# Patient Record
Sex: Male | Born: 1960 | Race: Black or African American | Hispanic: No | Marital: Single | Smoking: Current every day smoker
Health system: Southern US, Community
[De-identification: ages and names within clinical notes are randomized; demographics above are authoritative.]

## PROBLEM LIST (undated history)

## (undated) DIAGNOSIS — W3400XA Accidental discharge from unspecified firearms or gun, initial encounter: Secondary | ICD-10-CM

## (undated) DIAGNOSIS — K625 Hemorrhage of anus and rectum: Secondary | ICD-10-CM

## (undated) DIAGNOSIS — I509 Heart failure, unspecified: Secondary | ICD-10-CM

## (undated) DIAGNOSIS — R079 Chest pain, unspecified: Secondary | ICD-10-CM

## (undated) DIAGNOSIS — I10 Essential (primary) hypertension: Secondary | ICD-10-CM

## (undated) DIAGNOSIS — Y249XXA Unspecified firearm discharge, undetermined intent, initial encounter: Secondary | ICD-10-CM

## (undated) DIAGNOSIS — I255 Ischemic cardiomyopathy: Secondary | ICD-10-CM

## (undated) DIAGNOSIS — K921 Melena: Secondary | ICD-10-CM

## (undated) DIAGNOSIS — F419 Anxiety disorder, unspecified: Secondary | ICD-10-CM

## (undated) DIAGNOSIS — E785 Hyperlipidemia, unspecified: Secondary | ICD-10-CM

## (undated) DIAGNOSIS — I2510 Atherosclerotic heart disease of native coronary artery without angina pectoris: Secondary | ICD-10-CM

## (undated) DIAGNOSIS — M1990 Unspecified osteoarthritis, unspecified site: Secondary | ICD-10-CM

## (undated) DIAGNOSIS — R42 Dizziness and giddiness: Secondary | ICD-10-CM

## (undated) DIAGNOSIS — I219 Acute myocardial infarction, unspecified: Secondary | ICD-10-CM

## (undated) HISTORY — DX: Ischemic cardiomyopathy: I25.5

## (undated) HISTORY — PX: OTHER SURGICAL HISTORY: SHX169

## (undated) HISTORY — PX: ABDOMINAL EXPLORATION SURGERY: SHX538

## (undated) HISTORY — PX: CORONARY ANGIOPLASTY WITH STENT PLACEMENT: SHX49

## (undated) HISTORY — DX: Atherosclerotic heart disease of native coronary artery without angina pectoris: I25.10

## (undated) HISTORY — DX: Hemorrhage of anus and rectum: K62.5

## (undated) HISTORY — DX: Dizziness and giddiness: R42

## (undated) HISTORY — DX: Hyperlipidemia, unspecified: E78.5

## (undated) HISTORY — DX: Chest pain, unspecified: R07.9

---

## 1999-10-18 ENCOUNTER — Emergency Department (HOSPITAL_COMMUNITY): Admission: EM | Admit: 1999-10-18 | Discharge: 1999-10-18 | Payer: Self-pay | Admitting: Emergency Medicine

## 1999-10-18 ENCOUNTER — Encounter: Payer: Self-pay | Admitting: Emergency Medicine

## 1999-10-19 ENCOUNTER — Encounter: Payer: Self-pay | Admitting: Emergency Medicine

## 1999-11-05 ENCOUNTER — Emergency Department (HOSPITAL_COMMUNITY): Admission: EM | Admit: 1999-11-05 | Discharge: 1999-11-05 | Payer: Self-pay | Admitting: Emergency Medicine

## 1999-11-05 ENCOUNTER — Encounter: Payer: Self-pay | Admitting: Emergency Medicine

## 2000-05-01 ENCOUNTER — Encounter: Payer: Self-pay | Admitting: Emergency Medicine

## 2000-05-01 ENCOUNTER — Emergency Department (HOSPITAL_COMMUNITY): Admission: EM | Admit: 2000-05-01 | Discharge: 2000-05-01 | Payer: Self-pay | Admitting: Emergency Medicine

## 2003-06-04 ENCOUNTER — Emergency Department (HOSPITAL_COMMUNITY): Admission: EM | Admit: 2003-06-04 | Discharge: 2003-06-04 | Payer: Self-pay | Admitting: Emergency Medicine

## 2009-11-01 ENCOUNTER — Emergency Department (HOSPITAL_COMMUNITY)
Admission: EM | Admit: 2009-11-01 | Discharge: 2009-11-01 | Payer: Self-pay | Source: Home / Self Care | Admitting: Emergency Medicine

## 2010-11-28 ENCOUNTER — Emergency Department (HOSPITAL_COMMUNITY)
Admission: EM | Admit: 2010-11-28 | Discharge: 2010-11-28 | Disposition: A | Discharge status: Home / Self Care | Payer: Self-pay | Attending: Emergency Medicine | Admitting: Emergency Medicine

## 2010-11-28 ENCOUNTER — Emergency Department (HOSPITAL_COMMUNITY): Payer: Self-pay

## 2010-11-28 ENCOUNTER — Encounter: Payer: Self-pay | Admitting: *Deleted

## 2010-11-28 DIAGNOSIS — R1031 Right lower quadrant pain: Secondary | ICD-10-CM | POA: Insufficient documentation

## 2010-11-28 DIAGNOSIS — F17200 Nicotine dependence, unspecified, uncomplicated: Secondary | ICD-10-CM | POA: Insufficient documentation

## 2010-11-28 HISTORY — DX: Essential (primary) hypertension: I10

## 2010-11-28 LAB — CBC
HCT: 47.7 % (ref 39.0–52.0)
Hemoglobin: 16.5 g/dL (ref 13.0–17.0)
MCH: 31.7 pg (ref 26.0–34.0)
MCHC: 34.6 g/dL (ref 30.0–36.0)
MCV: 91.7 fL (ref 78.0–100.0)
Platelets: 194 10*3/uL (ref 150–400)
RBC: 5.2 MIL/uL (ref 4.22–5.81)
RDW: 14.7 % (ref 11.5–15.5)
WBC: 7.9 10*3/uL (ref 4.0–10.5)

## 2010-11-28 LAB — DIFFERENTIAL
Basophils Absolute: 0 K/uL (ref 0.0–0.1)
Basophils Relative: 0 % (ref 0–1)
Eosinophils Absolute: 0.1 K/uL (ref 0.0–0.7)
Eosinophils Relative: 2 % (ref 0–5)
Lymphocytes Relative: 28 % (ref 12–46)
Lymphs Abs: 2.2 K/uL (ref 0.7–4.0)
Monocytes Absolute: 0.8 K/uL (ref 0.1–1.0)
Monocytes Relative: 11 % (ref 3–12)
Neutro Abs: 4.6 K/uL (ref 1.7–7.7)
Neutrophils Relative %: 59 % (ref 43–77)

## 2010-11-28 LAB — URINALYSIS, ROUTINE W REFLEX MICROSCOPIC
Bilirubin Urine: NEGATIVE
Glucose, UA: NEGATIVE mg/dL
Hgb urine dipstick: NEGATIVE
Ketones, ur: NEGATIVE mg/dL
Leukocytes, UA: NEGATIVE
Nitrite: NEGATIVE
Protein, ur: NEGATIVE mg/dL
Specific Gravity, Urine: 1.019 (ref 1.005–1.030)
Urobilinogen, UA: 1 mg/dL (ref 0.0–1.0)
pH: 7 (ref 5.0–8.0)

## 2010-11-28 LAB — LIPASE, BLOOD: Lipase: 40 U/L (ref 11–59)

## 2010-11-28 LAB — COMPREHENSIVE METABOLIC PANEL WITH GFR
ALT: 17 U/L (ref 0–53)
AST: 21 U/L (ref 0–37)
Albumin: 3.2 g/dL — ABNORMAL LOW (ref 3.5–5.2)
Alkaline Phosphatase: 60 U/L (ref 39–117)
BUN: 12 mg/dL (ref 6–23)
CO2: 25 meq/L (ref 19–32)
Calcium: 8.6 mg/dL (ref 8.4–10.5)
Chloride: 106 meq/L (ref 96–112)
Creatinine, Ser: 1.12 mg/dL (ref 0.50–1.35)
GFR calc Af Amer: 87 mL/min — ABNORMAL LOW
GFR calc non Af Amer: 75 mL/min — ABNORMAL LOW
Glucose, Bld: 115 mg/dL — ABNORMAL HIGH (ref 70–99)
Potassium: 3.7 meq/L (ref 3.5–5.1)
Sodium: 139 meq/L (ref 135–145)
Total Bilirubin: 0.3 mg/dL (ref 0.3–1.2)
Total Protein: 7 g/dL (ref 6.0–8.3)

## 2010-11-28 LAB — LACTIC ACID, PLASMA: Lactic Acid, Venous: 1.5 mmol/L (ref 0.5–2.2)

## 2010-11-28 MED ORDER — MORPHINE SULFATE 4 MG/ML IJ SOLN
4.0000 mg | Freq: Once | INTRAMUSCULAR | Status: AC
  Administered 2010-11-28: 4 mg via INTRAVENOUS
  Filled 2010-11-28: qty 1

## 2010-11-28 MED ORDER — HYDROCODONE-ACETAMINOPHEN 5-325 MG PO TABS: 1.0000 | ORAL_TABLET | ORAL | Status: AC | PRN

## 2010-11-28 MED ORDER — IOHEXOL 300 MG/ML  SOLN: 100.0000 mL | Freq: Once | INTRAMUSCULAR | Status: DC | PRN

## 2010-11-28 MED ORDER — ONDANSETRON HCL 4 MG/2ML IJ SOLN
4.0000 mg | Freq: Once | INTRAMUSCULAR | Status: AC
  Administered 2010-11-28: 4 mg via INTRAVENOUS
  Filled 2010-11-28: qty 2

## 2010-11-28 NOTE — ED Provider Notes (Signed)
She relates he had a back ache all night that "felt like I worked all day" but states he hadn't. He states about an hour and a half ago he started getting some pain in his right mid abdomen he states he went to the bathroom and when he coughed he started getting worsening pain. He states it hurts when he deep breathes to remove salicylate to walk. He states sometimes it radiates into his back. He denies nausea or vomiting. Denied pain starting after eating food. Patient relates no prior medical problems. Patient is well-developed well-nourished awake and cooperative he has some pain in his lateral right mid abdomen near the costochondral border. There is no guarding or rebound noted he indicates he has had some pain in his flank and his right mid abdomen however they are not tender to palpation. It is noted however when the patient moves his right leg he has pain in his right mid abdomen.  I saw and evaluated the patient, reviewed the resident's note and I agree with the findings and plan. Devoria Albe, MD, [facility]   Ward Givens, MD 11/28/10 [phone]

## 2010-11-28 NOTE — ED Provider Notes (Signed)
See prior note   Ward Givens, MD 11/28/10 [phone]

## 2010-11-28 NOTE — ED Notes (Signed)
Patient with c/o left lower abdominal pain that started today.  He states that when he was coughing the pain was so bad he couldn't stand up.  Patient also c/o left arm numbness for about two weeks.

## 2010-11-28 NOTE — ED Provider Notes (Signed)
History     CSN: [id number] Arrival date & time: 11/28/2010  3:11 PM   First MD Initiated Contact with Patient 11/28/10 1627      Chief Complaint  Patient presents with  . Abdominal Pain    (Consider location/radiation/quality/duration/timing/severity/associated sxs/prior treatment) Patient is a 50 y.o. male presenting with abdominal pain. The history is provided by the patient.  Abdominal Pain The primary symptoms of the illness include abdominal pain. The current episode started less than 1 hour ago. The onset of the illness was gradual. The problem has been gradually worsening.  The abdominal pain began less than 1 hour ago. The pain came on gradually. The abdominal pain has been gradually worsening since its onset. The abdominal pain is located in the RLQ. The abdominal pain does not radiate. The abdominal pain is relieved by nothing. The abdominal pain is exacerbated by movement and certain positions.  The patient states that she believes she is currently not pregnant. Symptoms associated with the illness do not include chills, diaphoresis, heartburn, hematuria, frequency or back pain.  Pt reports that he has had pain in his back all night.  Today now has pain in L side of abd.    Past Medical History  Diagnosis Date  . Hypertension     History reviewed. No pertinent past surgical history.  History reviewed. No pertinent family history.  History  Substance Use Topics  . Smoking status: Current Some Day Smoker -- 1.0 packs/day    Types: Cigarettes  . Smokeless tobacco: Not on file  . Alcohol Use: Yes      Review of Systems  Constitutional: Negative for chills and diaphoresis.  Gastrointestinal: Positive for abdominal pain. Negative for heartburn.  Genitourinary: Negative for frequency and hematuria.  Musculoskeletal: Negative for back pain.  All other systems reviewed and are negative.    Allergies  Penicillins  Home Medications   Current Outpatient Rx    Name Route Sig Dispense Refill  . ASPIRIN 81 MG PO CHEW Oral Chew 81 mg by mouth daily as needed. For pain    . ZICAM COLD REMEDY NA Nasal Place 1 spray into the nose daily as needed. Cold symptoms    . TYLENOL COLD/FLU SEVERE PO Oral Take 15 mLs by mouth daily as needed. Cold symptoms      BP 144/96  Pulse 84  Temp(Src) 97.9 F (36.6 C) (Oral)  Resp 20  SpO2 97%  Physical Exam  Nursing note and vitals reviewed. Constitutional: He is oriented to person, place, and time. He appears well-developed and well-nourished.  HENT:  Head: Normocephalic and atraumatic.  Eyes: Conjunctivae are normal. Pupils are equal, round, and reactive to light.  Neck: Normal range of motion. Neck supple.  Cardiovascular: Normal rate, regular rhythm and normal heart sounds.   Pulmonary/Chest: Effort normal and breath sounds normal.  Abdominal: Soft. There is tenderness (TTP of R abd). There is no rebound and no guarding.  Musculoskeletal: Normal range of motion. He exhibits no edema.  Neurological: He is alert and oriented to person, place, and time. No cranial nerve deficit.  Skin: Skin is warm and dry. No erythema.    ED Course  Procedures (including critical care time)  Labs Reviewed  COMPREHENSIVE METABOLIC PANEL - Abnormal; Notable for the following:    Glucose, Bld 115 (*)    Albumin 3.2 (*)    GFR calc non Af Amer 75 (*)    GFR calc Af Amer 87 (*)    All other components  within normal limits  CBC  DIFFERENTIAL  LIPASE, BLOOD  URINALYSIS, ROUTINE W REFLEX MICROSCOPIC  LACTIC ACID, PLASMA   Ct Abdomen Pelvis W Contrast  11/28/2010  *RADIOLOGY REPORT*  Clinical Data: Right lower quadrant abdominal pain  CT ABDOMEN AND PELVIS WITH CONTRAST  Technique:  Multidetector CT imaging of the abdomen and pelvis was performed following the standard protocol during bolus administration of intravenous contrast.  Contrast:  100 ml Omnipaque-300 IV  Comparison: None.  Findings: Lung bases are clear.   Liver, spleen, pancreas, and adrenal glands within normal limits.  Gallbladder is contracted.  No intrahepatic or extrahepatic ductal dilatation.  Two left renal cysts (series 5/images 16 and 23).  Right kidney is within normal limits.  No renal calculi or hydronephrosis.  No evidence of bowel obstruction.  Normal appendix.  Mild atherosclerotic calcifications of the aorta and branch vessels.  No abdominopelvic ascites.  No suspicious abdominopelvic lymphadenopathy.  Prostate is unremarkable.  Bladder is within normal limits.  Mild degenerative changes of the visualized thoracolumbar spine.  IMPRESSION: Normal appendix.  No evidence of bowel obstruction.  No renal, ureteral, or bladder calculi.  No hydronephrosis.  No CT findings to account for the patient's abdominal pain.  Original Report Authenticated By: Charline Bills, M.D.     No diagnosis found.    MDM  Pt presented due to R sided abd pain.  Reports pain started in back last night and now is in abd.  TTP of r side of abd.  No N/V/D.  No fevers.  No changes in appetitie.  Ct scan ordered to r/o appy.  Labs neg.  CT scan neg.  Pain improved with pain meds.  Will d/c home.         Nena Alexander, MD 11/28/10 1910

## 2011-01-12 DIAGNOSIS — I219 Acute myocardial infarction, unspecified: Secondary | ICD-10-CM

## 2011-01-12 HISTORY — DX: Acute myocardial infarction, unspecified: I21.9

## 2011-01-29 DIAGNOSIS — I2510 Atherosclerotic heart disease of native coronary artery without angina pectoris: Secondary | ICD-10-CM

## 2011-01-30 ENCOUNTER — Encounter (HOSPITAL_COMMUNITY)
Admission: EM | Disposition: A | Discharge status: Home / Self Care | Payer: Self-pay | Source: Home / Self Care | Attending: Interventional Cardiology

## 2011-01-30 ENCOUNTER — Other Ambulatory Visit: Payer: Self-pay

## 2011-01-30 ENCOUNTER — Encounter (HOSPITAL_COMMUNITY): Payer: Self-pay | Admitting: Emergency Medicine

## 2011-01-30 ENCOUNTER — Inpatient Hospital Stay (HOSPITAL_COMMUNITY)
Admission: EM | Admit: 2011-01-30 | Discharge: 2011-02-01 | DRG: 247 | Disposition: A | Discharge status: Home / Self Care | Payer: Self-pay | Attending: Interventional Cardiology | Admitting: Interventional Cardiology

## 2011-01-30 DIAGNOSIS — I10 Essential (primary) hypertension: Secondary | ICD-10-CM | POA: Diagnosis present

## 2011-01-30 DIAGNOSIS — F17200 Nicotine dependence, unspecified, uncomplicated: Secondary | ICD-10-CM | POA: Diagnosis present

## 2011-01-30 DIAGNOSIS — Z23 Encounter for immunization: Secondary | ICD-10-CM

## 2011-01-30 DIAGNOSIS — I2119 ST elevation (STEMI) myocardial infarction involving other coronary artery of inferior wall: Principal | ICD-10-CM

## 2011-01-30 DIAGNOSIS — I2510 Atherosclerotic heart disease of native coronary artery without angina pectoris: Secondary | ICD-10-CM

## 2011-01-30 DIAGNOSIS — I2582 Chronic total occlusion of coronary artery: Secondary | ICD-10-CM | POA: Diagnosis present

## 2011-01-30 DIAGNOSIS — I213 ST elevation (STEMI) myocardial infarction of unspecified site: Secondary | ICD-10-CM

## 2011-01-30 DIAGNOSIS — R079 Chest pain, unspecified: Secondary | ICD-10-CM

## 2011-01-30 HISTORY — PX: LEFT HEART CATHETERIZATION WITH CORONARY ANGIOGRAM: SHX5451

## 2011-01-30 LAB — POCT I-STAT, CHEM 8
BUN: 17 mg/dL (ref 6–23)
Calcium, Ion: 1.03 mmol/L — ABNORMAL LOW (ref 1.12–1.32)
Chloride: 108 mEq/L (ref 96–112)
Creatinine, Ser: 1.2 mg/dL (ref 0.50–1.35)
Glucose, Bld: 127 mg/dL — ABNORMAL HIGH (ref 70–99)
HCT: 54 % — ABNORMAL HIGH (ref 39.0–52.0)
Hemoglobin: 18.4 g/dL — ABNORMAL HIGH (ref 13.0–17.0)
Potassium: 4 mEq/L (ref 3.5–5.1)
Sodium: 140 mEq/L (ref 135–145)
TCO2: 20 mmol/L (ref 0–100)

## 2011-01-30 LAB — PROTIME-INR
INR: 0.94 (ref 0.00–1.49)
Prothrombin Time: 12.8 seconds (ref 11.6–15.2)

## 2011-01-30 LAB — CBC
HCT: 48.4 % (ref 39.0–52.0)
HCT: 46.9 % (ref 39.0–52.0)
Hemoglobin: 17.1 g/dL — ABNORMAL HIGH (ref 13.0–17.0)
Hemoglobin: 16.2 g/dL (ref 13.0–17.0)
MCH: 32 pg (ref 26.0–34.0)
MCH: 31.4 pg (ref 26.0–34.0)
MCHC: 35.3 g/dL (ref 30.0–36.0)
MCHC: 34.5 g/dL (ref 30.0–36.0)
MCV: 90.5 fL (ref 78.0–100.0)
MCV: 90.9 fL (ref 78.0–100.0)
Platelets: 194 10*3/uL (ref 150–400)
Platelets: 196 10*3/uL (ref 150–400)
RBC: 5.35 MIL/uL (ref 4.22–5.81)
RBC: 5.16 MIL/uL (ref 4.22–5.81)
RDW: 14.7 % (ref 11.5–15.5)
RDW: 14.7 % (ref 11.5–15.5)
WBC: 10.9 10*3/uL — ABNORMAL HIGH (ref 4.0–10.5)
WBC: 7.5 10*3/uL (ref 4.0–10.5)

## 2011-01-30 LAB — CARDIAC PANEL(CRET KIN+CKTOT+MB+TROPI)
CK, MB: 197.9 ng/mL (ref 0.3–4.0)
CK, MB: 104.1 ng/mL (ref 0.3–4.0)
Relative Index: 9.6 — ABNORMAL HIGH (ref 0.0–2.5)
Relative Index: 7.3 — ABNORMAL HIGH (ref 0.0–2.5)
Total CK: 2063 U/L — ABNORMAL HIGH (ref 7–232)
Total CK: 1426 U/L — ABNORMAL HIGH (ref 7–232)
Troponin I: >25 ng/mL (ref <0.30)
Troponin I: >25 ng/mL (ref <0.30)

## 2011-01-30 LAB — COMPREHENSIVE METABOLIC PANEL
ALT: 15 U/L (ref 0–53)
AST: 22 U/L (ref 0–37)
Albumin: 3.5 g/dL (ref 3.5–5.2)
Alkaline Phosphatase: 62 U/L (ref 39–117)
BUN: 14 mg/dL (ref 6–23)
CO2: 19 mEq/L (ref 19–32)
Calcium: 8.9 mg/dL (ref 8.4–10.5)
Chloride: 103 mEq/L (ref 96–112)
Creatinine, Ser: 0.97 mg/dL (ref 0.50–1.35)
GFR calc Af Amer: >90 mL/min (ref >90)
GFR calc non Af Amer: >90 mL/min (ref >90)
Glucose, Bld: 126 mg/dL — ABNORMAL HIGH (ref 70–99)
Potassium: 3.8 mEq/L (ref 3.5–5.1)
Sodium: 138 mEq/L (ref 135–145)
Total Bilirubin: 0.2 mg/dL — ABNORMAL LOW (ref 0.3–1.2)
Total Protein: 7.7 g/dL (ref 6.0–8.3)

## 2011-01-30 LAB — BASIC METABOLIC PANEL
BUN: 13 mg/dL (ref 6–23)
CO2: 20 mEq/L (ref 19–32)
Calcium: 8.8 mg/dL (ref 8.4–10.5)
Chloride: 103 mEq/L (ref 96–112)
Creatinine, Ser: 0.91 mg/dL (ref 0.50–1.35)
GFR calc Af Amer: >90 mL/min (ref >90)
GFR calc non Af Amer: >90 mL/min (ref >90)
Glucose, Bld: 92 mg/dL (ref 70–99)
Potassium: 4.2 mEq/L (ref 3.5–5.1)
Sodium: 138 mEq/L (ref 135–145)

## 2011-01-30 LAB — POCT I-STAT TROPONIN I: Troponin i, poc: 0 ng/mL (ref 0.00–0.08)

## 2011-01-30 LAB — APTT: aPTT: 31 seconds (ref 24–37)

## 2011-01-30 LAB — MRSA PCR SCREENING: MRSA by PCR: NEGATIVE

## 2011-01-30 SURGERY — LEFT HEART CATHETERIZATION WITH CORONARY ANGIOGRAM
Anesthesia: LOCAL

## 2011-01-30 MED ORDER — ASPIRIN 81 MG PO CHEW
81.0000 mg | CHEWABLE_TABLET | Freq: Every day | ORAL | Status: DC
  Administered 2011-01-30 – 2011-02-01 (×3): 81 mg via ORAL
  Filled 2011-01-30 (×3): qty 1

## 2011-01-30 MED ORDER — SULFAMETHOXAZOLE-TRIMETHOPRIM 400-80 MG PO TABS
1.0000 | ORAL_TABLET | Freq: Two times a day (BID) | ORAL | Status: AC
  Administered 2011-01-30 – 2011-02-01 (×6): 1 via ORAL
  Filled 2011-01-30 (×7): qty 1

## 2011-01-30 MED ORDER — TICAGRELOR 90 MG PO TABS
ORAL_TABLET | ORAL | Status: AC
  Filled 2011-01-30: qty 2

## 2011-01-30 MED ORDER — MIDAZOLAM HCL 2 MG/2ML IJ SOLN
INTRAMUSCULAR | Status: AC
  Filled 2011-01-30: qty 2

## 2011-01-30 MED ORDER — ONDANSETRON HCL 4 MG/2ML IJ SOLN: 4.0000 mg | Freq: Four times a day (QID) | INTRAMUSCULAR | Status: DC | PRN

## 2011-01-30 MED ORDER — SODIUM CHLORIDE 0.9 % IV SOLN
30.0000 mL | INTRAVENOUS | Status: DC
  Administered 2011-01-30: 30 mL via INTRAVENOUS

## 2011-01-30 MED ORDER — HEPARIN (PORCINE) IN NACL 2-0.9 UNIT/ML-% IJ SOLN
INTRAMUSCULAR | Status: AC
  Filled 2011-01-30: qty 2000

## 2011-01-30 MED ORDER — NITROGLYCERIN 0.4 MG SL SUBL
SUBLINGUAL_TABLET | SUBLINGUAL | Status: AC
  Administered 2011-01-30 (×2): 0.4 mg
  Filled 2011-01-30: qty 75

## 2011-01-30 MED ORDER — ROSUVASTATIN CALCIUM 20 MG PO TABS
20.0000 mg | ORAL_TABLET | Freq: Every day | ORAL | Status: DC
  Administered 2011-01-30 – 2011-01-31 (×2): 20 mg via ORAL
  Filled 2011-01-30 (×5): qty 1

## 2011-01-30 MED ORDER — LORAZEPAM 2 MG/ML IJ SOLN
1.0000 mg | Freq: Four times a day (QID) | INTRAMUSCULAR | Status: DC | PRN
  Administered 2011-01-31: 1 mg via INTRAVENOUS
  Filled 2011-01-30: qty 1

## 2011-01-30 MED ORDER — TICAGRELOR 90 MG PO TABS
90.0000 mg | ORAL_TABLET | Freq: Two times a day (BID) | ORAL | Status: DC
  Administered 2011-01-30 – 2011-02-01 (×5): 90 mg via ORAL
  Filled 2011-01-30 (×8): qty 1

## 2011-01-30 MED ORDER — SODIUM CHLORIDE 0.9 % IV SOLN
1.0000 mL/kg/h | INTRAVENOUS | Status: AC
  Administered 2011-01-30: 1 mL/kg/h via INTRAVENOUS

## 2011-01-30 MED ORDER — INFLUENZA VIRUS VACC SPLIT PF IM SUSP
0.5000 mL | INTRAMUSCULAR | Status: DC
  Filled 2011-01-30: qty 0.5

## 2011-01-30 MED ORDER — FENTANYL CITRATE 0.05 MG/ML IJ SOLN
INTRAMUSCULAR | Status: AC
  Filled 2011-01-30: qty 2

## 2011-01-30 MED ORDER — OXYCODONE-ACETAMINOPHEN 5-325 MG PO TABS: 1.0000 | ORAL_TABLET | ORAL | Status: DC | PRN

## 2011-01-30 MED ORDER — ASPIRIN 81 MG PO CHEW: 81.0000 mg | CHEWABLE_TABLET | Freq: Every day | ORAL | Status: DC

## 2011-01-30 MED ORDER — MORPHINE SULFATE 2 MG/ML IJ SOLN
2.0000 mg | Freq: Once | INTRAMUSCULAR | Status: AC
  Administered 2011-01-30: 2 mg via INTRAVENOUS

## 2011-01-30 MED ORDER — MORPHINE SULFATE 2 MG/ML IJ SOLN
INTRAMUSCULAR | Status: AC
  Administered 2011-01-30: 2 mg via INTRAVENOUS
  Filled 2011-01-30: qty 1

## 2011-01-30 MED ORDER — ACETAMINOPHEN 325 MG PO TABS: 650.0000 mg | ORAL_TABLET | ORAL | Status: DC | PRN

## 2011-01-30 MED ORDER — ONDANSETRON HCL 4 MG/2ML IJ SOLN
INTRAMUSCULAR | Status: AC
  Administered 2011-01-30: 4 mg via INTRAVENOUS
  Filled 2011-01-30: qty 2

## 2011-01-30 MED ORDER — ASPIRIN 325 MG PO TABS: 325.0000 mg | ORAL_TABLET | Freq: Every day | ORAL | Status: DC

## 2011-01-30 MED ORDER — NITROGLYCERIN 0.2 MG/ML ON CALL CATH LAB
INTRAVENOUS | Status: AC
  Filled 2011-01-30: qty 1

## 2011-01-30 MED ORDER — MORPHINE SULFATE 2 MG/ML IJ SOLN: 1.0000 mg | INTRAMUSCULAR | Status: DC | PRN

## 2011-01-30 MED ORDER — METOPROLOL TARTRATE 25 MG PO TABS
25.0000 mg | ORAL_TABLET | Freq: Two times a day (BID) | ORAL | Status: DC
  Administered 2011-01-30 – 2011-02-01 (×6): 25 mg via ORAL
  Filled 2011-01-30 (×8): qty 1

## 2011-01-30 MED ORDER — LIDOCAINE HCL (PF) 1 % IJ SOLN
INTRAMUSCULAR | Status: AC
  Filled 2011-01-30: qty 30

## 2011-01-30 MED ORDER — ASPIRIN 81 MG PO CHEW
CHEWABLE_TABLET | ORAL | Status: AC
  Administered 2011-01-30: 324 mg
  Filled 2011-01-30: qty 4

## 2011-01-30 MED ORDER — ONDANSETRON HCL 4 MG/2ML IJ SOLN
4.0000 mg | Freq: Once | INTRAMUSCULAR | Status: AC
  Administered 2011-01-30: 4 mg via INTRAVENOUS

## 2011-01-30 MED ORDER — LISINOPRIL 10 MG PO TABS
10.0000 mg | ORAL_TABLET | Freq: Every day | ORAL | Status: DC
  Administered 2011-01-30 – 2011-02-01 (×3): 10 mg via ORAL
  Filled 2011-01-30 (×3): qty 1

## 2011-01-30 MED ORDER — BIVALIRUDIN 250 MG IV SOLR
INTRAVENOUS | Status: AC
  Filled 2011-01-30: qty 250

## 2011-01-30 MED ORDER — HEPARIN SODIUM (PORCINE) 5000 UNIT/ML IJ SOLN: 4000.0000 [IU] | INTRAMUSCULAR | Status: DC

## 2011-01-30 MED ORDER — NITROGLYCERIN IN D5W 200-5 MCG/ML-% IV SOLN
INTRAVENOUS | Status: AC
  Administered 2011-01-30: 10 ug/min
  Filled 2011-01-30: qty 250

## 2011-01-30 MED ORDER — HEPARIN SODIUM (PORCINE) 5000 UNIT/ML IJ SOLN
INTRAMUSCULAR | Status: AC
  Administered 2011-01-30: 5000 [IU]
  Filled 2011-01-30: qty 1

## 2011-01-30 NOTE — H&P (Signed)
NAMEMarland Kitchen  Frank Flowers, Frank Flowers NO.:  [id number]  MEDICAL RECORD NO.:  [id number]  LOCATION:  2912                         FACILITY:  [facility]  PHYSICIAN:  Natasha Bence, MD       DATE OF BIRTH:  06/12/1960  DATE OF ADMISSION:  01/30/2011 DATE OF DISCHARGE:                             HISTORY & PHYSICAL   CHIEF COMPLAINT:  Chest pain.  HISTORY OF PRESENT ILLNESS:  Frank Flowers is a 51 year old white male with no past medical history, who presents with substernal chest discomfort that started approximately 45 minutes prior to presentation.  He also has associated shortness of breath, nausea, and diaphoresis.  He denies any previous pain like this.  He has had intermittent shortness of breath with exertion.  He has never sought medical care for any of these complaints.  Otherwise, he denies any recent complaints.  He has no upcoming planned surgeries.  He denies any bleeding episodes.  He does says he has occasional bright red blood in toilet approximately once a month, possibly hemorrhoidal bleeding.  Otherwise, no bleeding episodes. He denies any light headedness, dizziness, or palpitations.  He has not had any history of syncope.  He is a smoker, smokes every day. Otherwise, he has no known medical problems.  REVIEW OF SYSTEMS:  Complete review of systems was performed that was negative.  He denies any fevers, chills, or sweats.  No weight loss.  He reports he can be compliant with medications.  He has occasional arthritis, otherwise other review of systems was negative.  PAST MEDICAL HISTORY:  No known past medical history.  FAMILY HISTORY:  Mother with heart disease and he has a brother with a murmur.  SOCIAL HISTORY:  He smokes daily, half pack to a pack per day, and he drinks alcohol on occasion.  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  ALLERGIES:  He is allergic to PENICILLIN.  MEDICATIONS:  He takes no home medications.  PHYSICAL EXAMINATION:  VITAL SIGNS:  He  is in significant distress. Blood pressure was 119/82, heart rate was 89, respiratory rate was 32, O2 sats are 96%.  GENERAL:  He is a well-developed white male, in significant distress. HEENT:  Eyes, anicteric sclerae.  Pupils equal, round, reactive to light.  Within normal limits. NECK:  He had no carotid bruits.  Normal jugular venous pressure. LUNGS:  Clear to auscultation bilaterally. CARDIOVASCULAR:  Regular rate and rhythm.  No murmurs, rubs, or gallops. ABDOMEN:  Soft, nontender, nondistended. EXTREMITIES:  Warm with no edema.  He has symmetrical pulses throughout. NEURO:  Grossly afocal.  LABORATORY DATA:  Sodium 140, potassium 4.0, chloride 108, bicarb of 20, BUN of 17, creatinine of 1.2, glucose of 127, ionized calcium 1.03. White blood cell count was 10.9, hematocrit is 48.4, platelet count was 194.  PT was 12.8, INR was 0.94, PTT was 31.  Troponin initially was measured at 0.0.  EKG showed a sinus mechanism.  He had inferior ST elevation with reciprocal ST depression.  IMPRESSION AND PLAN:  This is a 51 year old male with no known past medical history.  He has never really been to a doctor before, who presents with  acute chest pain and acute inferior ST-elevation myocardial infarction.  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  His left system was free of significant disease.  He will be admitted to the CCU for further monitoring and medical therapy with anti-platelet agents including aspirin and dual anti-platelet therapy.  We will also place him on a statin.  Monitor his blood glucose.  Check a fasting lipid panel in the morning.  We will also obtain an echocardiogram to evaluate his LV systolic function in the morning.  We will need to encourage smoking cessation.          ______________________________ Natasha Bence, MD     MH/MEDQ   D:  01/30/2011  T:  01/30/2011  Job:  [id number]

## 2011-01-30 NOTE — Progress Notes (Signed)
CARDIAC REHAB PHASE I   PRE:  Rate/Rhythm:84 SR    BP:  Supine: 134/981  Sitting:   Standing:    SaO2: 100 %  MODE:  Ambulation: 50  ft   POST:  Rate/Rhythem: 86  BP:  Supine:   Sitting: 160/83  Standing:    SaO2: 99 %  0940-1030 Patient ambulated with assist x 1.  On getting out of bed he stated he felt shortness of breath.  Breath sounds, some crackles in the bases.  Nurse Marylene Land aware, also assessed patient.  After short distance, he did feel light headed, some nausea when he returned to bed, this subsided when he sat down.  Increased dyspnea as he ambulated. He was returned to bed, call bell was within reach.  He does have an unsteady gait, states he has lots of knee problems.   Jackey Loge

## 2011-01-30 NOTE — ED Notes (Signed)
Verbal orders received from Drs Margo Aye & Ghim, initiated.

## 2011-01-30 NOTE — Progress Notes (Signed)
Dr. Donnie Aho on the unit and made aware of cardiac enzymes.

## 2011-01-30 NOTE — ED Notes (Signed)
MD at bedside. Dr.Hall at bedside

## 2011-01-30 NOTE — ED Notes (Addendum)
Pt up to CL with card MD, RRT RN, primary RN EMT: on zoll monitor with pads, O2 & ntg gtt. Remains alert, severe pain (improved), calmer, skin W&D, resps more even & slower (was hyperventilating). VSS.

## 2011-01-30 NOTE — Progress Notes (Signed)
CRITICAL VALUE ALERT  Critical value received:  ckmb,trop  Date of notification:  01/30/2011  Time of notification:  1045  Critical value read back:yes  Nurse who received alert:  Tammy Sours  MD notified (1st page):  None, per protocol,had stemi,   Time of first page:    MD notified (2nd page):  Time of second page:  Responding MD:  Time MD responded:

## 2011-01-30 NOTE — ED Provider Notes (Signed)
History     CSN: [id number]  Arrival date & time 01/30/11  0055   First MD Initiated Contact with Patient 01/30/11 0110      Chief Complaint  Patient presents with  . Chest Pain    chest pain x30 min    (Consider location/radiation/quality/duration/timing/severity/associated sxs/prior treatment) HPI Comments: Level 5 caveat due to urgent need for intervention and condition of the patient.  History is obtained from the patient and his fiance. He reports that he was a little nauseous prior to eating food a couple hours previously. He did not vomit and was able to finish eating. He reports that about 30 minutes prior to arrival he was playing with his children with his fiance and developed substernal chest pain that was 10 out of 10 radiating to his throat. He felt like something is sitting heavily on his chest and is associated with shortness of breath. No sweating no vomiting. Patient denies any prior history of cardiac disease no family history of cardiac disease. He does not take any medications for health conditions at this time. He does smoke cigarettes. He reports no recent fevers, cough or cold symptoms.  Patient is a 51 y.o. male presenting with chest pain. The history is provided by the patient and a relative. The history is limited by the condition of the patient.  Chest Pain     Past Medical History  Diagnosis Date  . Hypertension     History reviewed. No pertinent past surgical history.  History reviewed. No pertinent family history.  History  Substance Use Topics  . Smoking status: Current Some Day Smoker -- 1.0 packs/day    Types: Cigarettes  . Smokeless tobacco: Not on file  . Alcohol Use: Yes      Review of Systems  Unable to perform ROS: Other  Cardiovascular: Positive for chest pain.    Allergies  Penicillins  Home Medications   No current outpatient prescriptions on file.  BP 122/82  Pulse 78  Temp(Src) 98.1 F (36.7 C) (Oral)  Resp 20   Ht 6\' 2"  (1.88 m)  Wt 280 lb (127.007 kg)  BMI 35.95 kg/m2  SpO2 97%  Physical Exam  Nursing note and vitals reviewed. Constitutional: He appears well-developed and well-nourished. He appears distressed.  HENT:  Head: Normocephalic.  Eyes: Pupils are equal, round, and reactive to light. No scleral icterus.  Neck: Normal range of motion. Neck supple.  Cardiovascular: Normal rate, regular rhythm, S1 normal and S2 normal.   No extrasystoles are present. PMI is not displaced.   No murmur heard. Pulmonary/Chest: Effort normal. He has no wheezes. He has no rales.  Abdominal: Soft. Normal appearance and bowel sounds are normal. There is no tenderness. There is no rebound, no guarding and no CVA tenderness.  Neurological: He is alert.  Skin: Skin is warm and dry.  Psychiatric: His mood appears anxious.    ED Course  Procedures (including critical care time)  CRITICAL CARE Performed by: Lear Ng.   Total critical care time: 30 min  Critical care time was exclusive of separately billable procedures and treating other patients.  Critical care was necessary to treat or prevent imminent or life-threatening deterioration.  Critical care was time spent personally by me on the following activities: development of treatment plan with patient and/or surrogate as well as nursing, discussions with consultants, evaluation of patient's response to treatment, examination of patient, obtaining history from patient or surrogate, ordering and performing treatments and interventions, ordering and review of  laboratory studies, ordering and review of radiographic studies, pulse oximetry and re-evaluation of patient's condition.    Labs Reviewed  I-STAT TROPONIN I  CBC  COMPREHENSIVE METABOLIC PANEL  I-STAT, CHEM 8  PROTIME-INR  APTT   No results found.   1. STEMI (ST elevation myocardial infarction)     Room air saturation is 97% which is normal.  ECG at time 00:56 shows sinus rhythm  at rate 77. ST elevation is noted in leads 23 and aVF with reciprocal ST depression and inverted T-wave in aVL. There is mild flattening of T waves in anterior leads. Axis is normal. No prior EKGs are available.  MDM   Patient's history and EKG is suggestive of an inferior ST elevation MI. Patient's risk factor appears to be smoking cigarettes. STEMI was called by my colleague Dr. Preston Fleeting. I agree with his assessment. I've spoken with Dr. Eldridge Dace, cardiologist on call who will see the patient immediately. 2 IVs will be established, patient is to be given 4 baby aspirin as well as heparin bolus. Patient will be giving nitroglycerin and morphine for symptoms as well as Zofran for any resultant nausea. Currently his vital signs are stable. He is placed on nasal cannula oxygen as well. Plan is to send the patient immediately to cath lab when available.       Gavin Pound. Oletta Lamas, MD 01/30/11 4098

## 2011-01-30 NOTE — ED Notes (Addendum)
In ER via POV c/o chest pain started around PTA. Dr.Ghim at bedside. EKG in progress. Patient is alert oriented x3. Patient is restless, attempting to calm him down. He points to his chest c/o chest pain and sob with pain and nausea, patient stated that he ate and had something to drink, when when he started experiencing severe chest pain. He told his fiance and she drove him here in [facility].  CODE STEMI was called at 0059

## 2011-01-30 NOTE — Op Note (Signed)
PROCEDURE:  Left heart catheterization with selective coronary angiography, left ventriculogram. PCI RCA  INDICATIONS:    Emergent procedure with emergency consent.  PROCEDURE TECHNIQUE:  After Xylocaine anesthesia a 49F sheath was placed in the right femoral artery with a single anterior needle wall stick.   Left coronary angiography was done using a Judkins L4 guide catheter.  Right coronary angiography was done using a Judkins R4 guide catheter.  PCI of the right coronary artery was performed. Left ventriculography was done using a pigtail catheter.  An Angio-Seal was deployed for hemostasis.   CONTRAST:  Total of 105 cc.  COMPLICATIONS:  None.    HEMODYNAMICS:  Aortic pressure was 116/12; LV pressure was 111/80; LVEDP 20.  There was no gradient between the left ventricle and aorta.    ANGIOGRAPHIC DATA:   The left main coronary artery is widely patent.  The left anterior descending artery is a large vessel which reaches the apex.  It does wrap around as well.  There is mild atherosclerosis in the proximal to midportion.  The first diagonal is a medium-sized vessel which is widely patent.  The second diagonal is small which is patent.  The third diagonal is medium-sized and widely patent..  The left circumflex artery is a large vessel.  There is a large OM 1 which appears widely patent.  The remainder of the circumflex is also angiographically normal.  There is a small second OM which is patent..  The right coronary artery is a large dominant vessel.  There is moderate atherosclerosis in the proximal to midportion.  The distal right coronary artery is occluded.  After angioplasty, we're able to see that there is a large PDA which appears widely patent.  There is a medium-sized posterior lateral artery which is also widely patent and.  LEFT VENTRICULOGRAM:  Left ventricular angiogram was done in the 30 RAO projection and revealed severe left ventricular dysfunction with inferior and apical  hypokinesis.   The estimated ejection fraction was 30%.  LVEDP was 20 mmHg.  PCI NARRATIVE: A JR 4 guiding catheter was used to engage the right coronary artery.  Angiomax his reticulocyte regulation.  A pro-water wire was placed across the occlusion.  An expressway aspiration catheter was then used successfully remove thrombus.  TIMI-3 flow was restored after the expressway was used.  A 2.5 x 15 Trac balloon was used to dilate the lesion.  It was inflated at 8 atmospheres.  There was a long area of disease.  A 3.5 x 28 promus element drug-eluting stent was then deployed at 14 atmospheres.  The stent was post dilated with a 4.0 x 20 mm Quantum apex balloon inflated to 20 atmospheres.  There is no residual stenosis within the stented area.  TIMI-3 flow was maintained.  The patient did not have any further chest discomfort.  Due to the fact that the patient was not sitting still, we elected to use a closure device.  IMPRESSIONS:  1. Normal left main coronary artery. 2. Mild atherosclerosis in the LAD system. 3. Normal left circumflex artery and its branches. 4. 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  This was successfully treated with a 3.5 x 28 Promus drug-eluting stent post dilated to greater than 4 mm in diameter. 5. Severely reduced left ventricular systolic function.  LVEDP 20 mmHg.  Ejection fraction 30 %.  RECOMMENDATION:  The patient we watched in the CCU.  We'll start statin, beta blocker along with dual antiplatelet therapy.  He needs to stop smoking.  He'll need aggressive secondary prevention.  He'll need to continue dual antiplatelet therapy for at least a year.  We'll treat with an antibiotic because he did contaminate the cath site during the procedure.

## 2011-01-30 NOTE — Progress Notes (Signed)
Subjective:  Came in with IMI last night.  No chest pain at present.  Not SOB.  Objective:  Vital Signs in the last 24 hours: BP 124/78  Pulse 80  Temp(Src) 97.7 F (36.5 C) (Oral)  Resp 15  Ht 6\' 2"  (1.88 m)  Wt 127.007 kg (280 lb)  BMI 35.95 kg/m2  SpO2 100%  Physical Exam: Mildly sleepy BM in NAD Lungs:  Clear to A&P Cardiac:  Regular rhythm, normal S1 and S2, no S3 Extremities: Cath site clean and dry  Intake/Output from previous day: 01/18 0701 - 01/19 0700 In: 635 [I.V.:635] Out: 850 [Urine:850]  Lab Results: Basic Metabolic Panel:  Basename 01/30/11 0500 01/30/11 0128 01/30/11 0058  NA 138 140 --  K 4.2 4.0 --  CL 103 108 --  CO2 20 -- 19  GLUCOSE 92 127* --  BUN 13 17 --  CREATININE 0.91 1.20 --   CBC:  Basename 01/30/11 0500 01/30/11 0128 01/30/11 0058  WBC 7.5 -- 10.9*  NEUTROABS -- -- --  HGB 16.2 18.4* --  HCT 46.9 54.0* --  MCV 90.9 -- 90.5  PLT 196 -- 194   Telemetry: Reviewed NSR  Assessment/Plan:  1. Acute inferior MI initial episode 2. Hypertension 3. Smoker  Rec:  Hopefully move to floor in am. Advance activity.  Begin ACE and Beta blocker.     Darden Palmer.  MD [facility] 01/30/2011, 9:02 AM

## 2011-01-30 NOTE — Progress Notes (Signed)
Pt arrived with fiance and her children. She was tearful and very upset. With her permission, we had prayer for [place]. Other family members arrived after fiance made calls.  My interaction was with pt fiance, mother, father and other family members and friends. I assisted and waited with family until the procedure was complete and pt was moved to the unit. With their permission I had prayer with family and informed staff of the family's location. Rev. Sarita Haver Ardis Rowan 01-30-11 2:39 am

## 2011-01-31 DIAGNOSIS — I2119 ST elevation (STEMI) myocardial infarction involving other coronary artery of inferior wall: Secondary | ICD-10-CM

## 2011-01-31 LAB — CARDIAC PANEL(CRET KIN+CKTOT+MB+TROPI)
CK, MB: 60.9 ng/mL (ref 0.3–4.0)
Relative Index: 5.9 — ABNORMAL HIGH (ref 0.0–2.5)
Total CK: 1033 U/L — ABNORMAL HIGH (ref 7–232)
Troponin I: >25 ng/mL (ref <0.30)

## 2011-01-31 LAB — BASIC METABOLIC PANEL
BUN: 11 mg/dL (ref 6–23)
CO2: 24 mEq/L (ref 19–32)
Calcium: 8.9 mg/dL (ref 8.4–10.5)
Chloride: 103 mEq/L (ref 96–112)
Creatinine, Ser: 0.94 mg/dL (ref 0.50–1.35)
GFR calc Af Amer: >90 mL/min (ref >90)
GFR calc non Af Amer: >90 mL/min (ref >90)
Glucose, Bld: 93 mg/dL (ref 70–99)
Potassium: 3.8 mEq/L (ref 3.5–5.1)
Sodium: 136 mEq/L (ref 135–145)

## 2011-01-31 MED ORDER — ALPRAZOLAM 0.25 MG PO TABS
0.2500 mg | ORAL_TABLET | Freq: Three times a day (TID) | ORAL | Status: DC | PRN
  Administered 2011-01-31 – 2011-02-01 (×2): 0.25 mg via ORAL
  Filled 2011-01-31 (×2): qty 1

## 2011-01-31 MED ORDER — ZOLPIDEM TARTRATE 5 MG PO TABS
10.0000 mg | ORAL_TABLET | Freq: Every evening | ORAL | Status: DC | PRN
  Administered 2011-01-31: 10 mg via ORAL
  Filled 2011-01-31: qty 2

## 2011-01-31 MED ORDER — ENOXAPARIN SODIUM 40 MG/0.4ML ~~LOC~~ SOLN
40.0000 mg | SUBCUTANEOUS | Status: DC
  Administered 2011-01-31 – 2011-02-01 (×2): 40 mg via SUBCUTANEOUS
  Filled 2011-01-31 (×2): qty 0.4

## 2011-01-31 MED ORDER — INFLUENZA VIRUS VACC SPLIT PF IM SUSP
0.5000 mL | INTRAMUSCULAR | Status: AC
  Administered 2011-02-01: 0.5 mL via INTRAMUSCULAR
  Filled 2011-01-31: qty 0.5

## 2011-01-31 NOTE — Plan of Care (Signed)
Problem: Phase II Progression Outcomes Goal: Pain controlled Outcome: Progressing Denies pain,pressure ancd tightness Goal: OOB to chair per PCI oders Outcome: Completed/Met Date Met:  01/31/11 Ambulates in room w/steady gait.No SOB noted. Goal: Vascular site scale level 0 - I Vascular Site Scale Level 0: No bruising/bleeding/hematoma Level I (Mild): Bruising/Ecchymosis, minimal bleeding/ooozing, palpable hematoma < 3 cm Level II (Moderate): Bleeding not affecting hemodynamic parameters, pseudoaneurysm, palpable hematoma > 3 cm  Outcome: Completed/Met Date Met:  01/31/11 Vascular site = level "0" . Goal: No post PCI angina Outcome: Completed/Met Date Met:  01/31/11 Denies pain and chest discomfort. Goal: Distal pulses equal baseline assessment Outcome: Completed/Met Date Met:  01/31/11 Pedal pulses = +2  bilat . Goal: Tolerating diet Tolerating diet w/o diff per report . Goal: Other Phase II Outcomes/Goals Outcome: Not Progressing Pt c/o not being able to .Marland Kitchen... "catch my breath .... I feel like I stop breathing ..." while sleeping . Girlfriend ( at bedside ), stated Pt appeared to be "gasping for his breath" occ while sleeping oxygen stas remain 97% on 2L/[place] .Nasal cannula worn while asleep only .

## 2011-01-31 NOTE — Progress Notes (Signed)
Subjective:  No chest pain overnight.  Denies SOB or other symptoms.  Objective:  Vital Signs in the last 24 hours: BP 120/91  Pulse 70  Temp(Src) 98.9 F (37.2 C) (Oral)  Resp 15  Ht 6\' 2"  (1.88 m)  Wt 127.007 kg (280 lb)  BMI 35.95 kg/m2  SpO2 97%  Physical Exam: Obese BM in NAD Lungs:  Clear to A&P Cardiac:  Regular rhythm, normal S1 and S2, no S3 Extremities:  Cath site clean  Intake/Output from previous day: 01/19 0701 - 01/20 0700 In: 1701 [P.O.:1320; I.V.:381] Out: 2150 [Urine:2150]  Lab Results: Basic Metabolic Panel:  Basename 01/31/11 0520 01/30/11 0500  NA 136 138  K 3.8 4.2  CL 103 103  CO2 24 20  GLUCOSE 93 92  BUN 11 13  CREATININE 0.94 0.91   CBC:  Basename 01/30/11 0500 01/30/11 0128 01/30/11 0058  WBC 7.5 -- 10.9*  NEUTROABS -- -- --  HGB 16.2 18.4* --  HCT 46.9 54.0* --  MCV 90.9 -- 90.5  PLT 196 -- 194   Cardiac Enzymes:  Basename 01/30/11 2334 01/30/11 1744 01/30/11 0936  CKTOTAL 1033* 1426* 2063*  CKMB 60.9* 104.1* 197.9*  CKMBINDEX -- -- --  TROPONINI >25.00* >25.00* >25.00*    Protime: . Lab Results  Component Value Date   INR 0.94 01/30/2011    Telemetry: Reviewed  NSR.  Assessment/Plan:  1. Acute inferior MI initial episode  2. Hypertension  3. Smoker   Rec:  Move to floor.  Patient education.  Possible d/c in am.   W. Ashley Royalty.  MD [facility] 01/31/2011, 10:47 AM

## 2011-01-31 NOTE — Plan of Care (Signed)
Problem: Phase III Progression Outcomes Goal: Vascular site scale level 0 - I Vascular Site Scale Level 0: No bruising/bleeding/hematoma Level I (Mild): Bruising/Ecchymosis, minimal bleeding/ooozing, palpable hematoma < 3 cm Level II (Moderate): Bleeding not affecting hemodynamic parameters, pseudoaneurysm, palpable hematoma > 3 cm  Outcome: Completed/Met Date Met:  01/31/11 Level 0

## 2011-02-01 LAB — POCT ACTIVATED CLOTTING TIME: Activated Clotting Time: 518 seconds

## 2011-02-01 MED ORDER — SIMVASTATIN 40 MG PO TABS
40.0000 mg | ORAL_TABLET | Freq: Every day | ORAL | Status: DC
  Filled 2011-02-01: qty 1

## 2011-02-01 MED ORDER — TICAGRELOR 90 MG PO TABS: 90.0000 mg | ORAL_TABLET | Freq: Two times a day (BID) | ORAL | Status: DC

## 2011-02-01 MED ORDER — LISINOPRIL 10 MG PO TABS: 10.0000 mg | ORAL_TABLET | Freq: Every day | ORAL | Status: DC

## 2011-02-01 MED ORDER — ASPIRIN 81 MG PO CHEW: 81.0000 mg | CHEWABLE_TABLET | Freq: Every day | ORAL | Status: AC

## 2011-02-01 MED ORDER — SIMVASTATIN 40 MG PO TABS: 40.0000 mg | ORAL_TABLET | Freq: Every day | ORAL | Status: DC

## 2011-02-01 MED ORDER — METOPROLOL TARTRATE 25 MG PO TABS: 25.0000 mg | ORAL_TABLET | Freq: Two times a day (BID) | ORAL | Status: DC

## 2011-02-01 MED FILL — Dextrose Inj 5%: INTRAVENOUS | Qty: 50 | Status: AC

## 2011-02-01 NOTE — Progress Notes (Signed)
CARDIAC REHAB PHASE I   PRE:  Rate/Rhythm: 76 SR  BP:  Supine:   Sitting: 128/80  Standing:    SaO2: 97% RA  MODE:  Ambulation: 550 ft   POST:  Rate/Rhythem: 78  BP:  Supine:   Sitting: 118/84  Standing:    SaO2:   0800 - 0900 Pt ambulated unit steady. No sob or cp. Tolerated well. Education done with understanding. Permission for out pt cardiac rehab, financial aide form given.    Rosalie Doctor

## 2011-02-01 NOTE — Progress Notes (Signed)
Pt is smoking 1 1/2 ppd-2 ppd  And is wanting to quit but not very happy about it. Discussed risk factors and pt voices understanding. He is in preparation stage for quitting. Recommended 42 mg patches to start with. Wrote down and discussed with pt how to taper dosage and duration. Discussed patch use directions. Referred to 1-800 quit now for f/u and support. Discussed oral fixation substitutes, second hand smoke and in home smoking policy. Reviewed and gave pt Written education/contact information.

## 2011-02-01 NOTE — Progress Notes (Signed)
   CARE MANAGEMENT NOTE 02/01/2011  Patient:  Frank Flowers, Frank Flowers   Account Number:  [id number]  Date Initiated:  02/01/2011  Documentation initiated by:  GRAVES-BIGELOW,Anahli Arvanitis  Subjective/Objective Assessment:   Pt in with cp-S/p LHC. Plan for home on Brilinta. Will provide pt with brilinta 30 day free card and place az&me forms on shadow chart-  MD please sign & write rx for brilinta 30 day free no refills and then original- refills. Thanks     Action/Plan:   CM will make pt an appointment for orange card eligibility 02-17-11 @ 11:30 and f/u MD appointment 03-23-11 with MD Clelia Croft. CM will call [facility] to assist with bill and to see if possible candidate for medicaid.   Anticipated DC Date:  02/01/2011   Anticipated DC Plan:  HOME/SELF CARE  In-house referral  Financial Counselor      DC Planning Services  [facility]igent [facility]  CM consult  Medication Assistance      Choice offered to / List presented to:             Status of service:  Completed, signed off Medicare Important Message given?   (If response is "NO", the following Medicare IM given date fields will be blank) Date Medicare IM given:   Date Additional Medicare IM given:    Discharge Disposition:  HOME/SELF CARE  Per UR Regulation:    Comments:  02-01-11 [address] Tomi Bamberger, RN,BSN [phone] CM did give pt az&me forms and relayed to him that if he has not heard anything back from co. to call MD office and make them aware and they may possibly have some samples. CM relayed to him that he should not miss this medication. CM did go over the other medications and they can be purchased at [facility] for 4.00 or [facility] for 3.99. Please make sure that pt is d/c on a cheaper statin. Will not be able to afford crestor. Thanks

## 2011-02-01 NOTE — Discharge Summary (Signed)
Patient ID: Frank Flowers MRN: [id number] DOB/AGE: [birth year] 51 y.o.  Admit date: 01/30/2011 Discharge date: 02/01/2011  Primary Discharge Diagnosis acute inferior myocardial infarction Secondary Discharge Diagnosis tobacco abuse, hypertension  Significant Diagnostic Studies: angiography: Cardiac cath on January 19 showed occluded distal right coronary artery.  Drug-eluting stent was placed in the distal right coronary artery to restore flow.  LVEF was 30%.  Consults: Case management to help get him medications and long term.  Hospital Course: 51 year old man who was brought emergently to the cath lab after ECG showed ST elevations inferiorly.  He underwent cardiac catheterization and tolerated the procedure well.  He was watched for 2 days.  Beta blocker, ACE inhibitor, statin were initiated.  He was also on dual antiplatelet therapy with aspirin and Brilinta.  I discussed the importance of the dual antiplatelet therapy with his family.  They assured me that they could get him to take his medicines.  The patient also stated he would not have a problem taking twice a day medications.  Initially, when he came in, he had been using alcohol.  He was much more lucid on the day of discharge.  He walked with cardiac rehabilitation and had no chest pain or shortness of breath.  He felt quite well.  He was eager to go home.  Forms were filled out to get him related to get him Brilinta free for 30 days.  Forms were also filled out to see if he could get free medicines from [facility].  We used simvastatin so that he would be able to afford this prescription. He was also counseled to stop smoking.   Discharge Exam: Blood pressure 130/75, pulse 75, temperature 97.5 F (36.4 C), temperature source Oral, resp. rate 20, height 6\' 2"  (1.88 m), weight 127.007 kg (280 lb), SpO2 98.00%.   [place]/AT RRR, S1, S2 CTA bilaterally Mildly obese No right groin hematoma Palpable right-sided  pedal pulse Labs:   Lab  Results  Component Value Date   WBC 7.5 01/30/2011   HGB 16.2 01/30/2011   HCT 46.9 01/30/2011   MCV 90.9 01/30/2011   PLT 196 01/30/2011    Lab 01/31/11 0520 01/30/11 0058  NA 136 --  K 3.8 --  CL 103 --  CO2 24 --  BUN 11 --  CREATININE 0.94 --  CALCIUM 8.9 --  PROT -- 7.7  BILITOT -- 0.2*  ALKPHOS -- 62  ALT -- 15  AST -- 22  GLUCOSE 93 --   Lab Results  Component Value Date   CKTOTAL 1033* 01/30/2011   CKMB 60.9* 01/30/2011   TROPONINI >25.00* 01/30/2011    No results found for this basename: CHOL   No results found for this basename: HDL   No results found for this basename: LDLCALC   No results found for this basename: TRIG   No results found for this basename: CHOLHDL   No results found for this basename: LDLDIRECT       JXB:JYNWGN sinus rhythm, inferior Q waves at discharge  FOLLOW UP PLANS AND APPOINTMENTS  Current Discharge Medication List    START taking these medications   Details  lisinopril (PRINIVIL,ZESTRIL) 10 MG tablet Take 1 tablet (10 mg total) by mouth daily. Qty: 30 tablet, Refills: 11    metoprolol tartrate (LOPRESSOR) 25 MG tablet Take 1 tablet (25 mg total) by mouth 2 (two) times daily. Qty: 60 tablet, Refills: 11    simvastatin (ZOCOR) 40 MG tablet Take 1 tablet (40 mg total) by mouth  daily at 6 PM. Qty: 30 tablet, Refills: 11    Ticagrelor (BRILINTA) 90 MG TABS tablet Take 1 tablet (90 mg total) by mouth 2 (two) times daily. Qty: 60 tablet, Refills: 11      CONTINUE these medications which have CHANGED   Details  aspirin 81 MG chewable tablet Chew 1 tablet (81 mg total) by mouth daily.      STOP taking these medications     Homeopathic Products (ZICAM COLD REMEDY NA)      Phenylephrine-DM-GG-APAP (TYLENOL COLD/FLU SEVERE PO)        Follow-up Information    Follow up with Corky Crafts., MD in 2 weeks. (sooner if symptoms)    Contact information:   [address] [place]  [address] [phone]          BRING ALL MEDICATIONS WITH YOU TO FOLLOW UP APPOINTMENTS  Time spent with patient to include physician time:35 minutes going over the plan and helping to arrange for the free medications.  I stressed the importance of dual antiplatelet therapy.  If he runs out of his [place], he should immediately contact our office and we will try to get him samples until we get him a larger supply.  It's very important for his stent to stay open.   SignedCorky Crafts. 02/01/2011, 2:09 PM

## 2011-03-17 ENCOUNTER — Ambulatory Visit (HOSPITAL_COMMUNITY): Admission: RE | Admit: 2011-03-17 | Payer: Self-pay | Source: Ambulatory Visit

## 2011-03-30 ENCOUNTER — Ambulatory Visit (HOSPITAL_COMMUNITY)
Admission: RE | Admit: 2011-03-30 | Discharge: 2011-03-30 | Disposition: A | Discharge status: Home / Self Care | Payer: Self-pay | Source: Ambulatory Visit | Attending: Interventional Cardiology | Admitting: Interventional Cardiology

## 2011-03-30 DIAGNOSIS — M79609 Pain in unspecified limb: Secondary | ICD-10-CM | POA: Insufficient documentation

## 2011-03-30 DIAGNOSIS — R1900 Intra-abdominal and pelvic swelling, mass and lump, unspecified site: Secondary | ICD-10-CM

## 2011-03-30 NOTE — Progress Notes (Signed)
VASCULAR LAB PRELIMINARY  PRELIMINARY  PRELIMINARY  PRELIMINARY  Right groin ultrasound completed.    Preliminary report:  No evidence of pseudoaneurysm or AV fistula.  Terance Hart, RVT 03/30/2011, 2:29 PM

## 2011-03-31 ENCOUNTER — Encounter (HOSPITAL_COMMUNITY): Payer: Self-pay | Admitting: Vascular Surgery

## 2011-03-31 ENCOUNTER — Ambulatory Visit (HOSPITAL_COMMUNITY)
Admission: RE | Admit: 2011-03-31 | Discharge: 2011-03-31 | Disposition: A | Discharge status: Home / Self Care | Payer: Self-pay | Source: Ambulatory Visit | Attending: Interventional Cardiology | Admitting: Interventional Cardiology

## 2011-03-31 DIAGNOSIS — I059 Rheumatic mitral valve disease, unspecified: Secondary | ICD-10-CM | POA: Insufficient documentation

## 2011-03-31 DIAGNOSIS — I340 Nonrheumatic mitral (valve) insufficiency: Secondary | ICD-10-CM

## 2011-05-04 ENCOUNTER — Other Ambulatory Visit (HOSPITAL_COMMUNITY): Payer: Self-pay | Admitting: Internal Medicine

## 2011-05-04 ENCOUNTER — Ambulatory Visit (HOSPITAL_COMMUNITY)
Admission: RE | Admit: 2011-05-04 | Discharge: 2011-05-04 | Disposition: A | Discharge status: Home / Self Care | Payer: Self-pay | Source: Ambulatory Visit | Attending: Internal Medicine | Admitting: Internal Medicine

## 2011-05-04 DIAGNOSIS — M1710 Unilateral primary osteoarthritis, unspecified knee: Secondary | ICD-10-CM | POA: Insufficient documentation

## 2011-05-04 DIAGNOSIS — IMO0002 Reserved for concepts with insufficient information to code with codable children: Secondary | ICD-10-CM | POA: Insufficient documentation

## 2011-05-04 DIAGNOSIS — R52 Pain, unspecified: Secondary | ICD-10-CM

## 2011-05-04 DIAGNOSIS — M25569 Pain in unspecified knee: Secondary | ICD-10-CM | POA: Insufficient documentation

## 2012-06-02 ENCOUNTER — Other Ambulatory Visit: Payer: Self-pay | Admitting: Gastroenterology

## 2012-06-08 ENCOUNTER — Ambulatory Visit (HOSPITAL_COMMUNITY)
Admission: RE | Admit: 2012-06-08 | Discharge: 2012-06-08 | Disposition: A | Discharge status: Home / Self Care | Payer: Self-pay | Source: Ambulatory Visit | Attending: Gastroenterology | Admitting: Gastroenterology

## 2012-06-08 ENCOUNTER — Encounter (HOSPITAL_COMMUNITY)
Admission: RE | Disposition: A | Discharge status: Home / Self Care | Payer: Self-pay | Source: Ambulatory Visit | Attending: Gastroenterology

## 2012-06-08 ENCOUNTER — Encounter (HOSPITAL_COMMUNITY): Payer: Self-pay | Admitting: *Deleted

## 2012-06-08 DIAGNOSIS — Z7902 Long term (current) use of antithrombotics/antiplatelets: Secondary | ICD-10-CM | POA: Insufficient documentation

## 2012-06-08 DIAGNOSIS — I252 Old myocardial infarction: Secondary | ICD-10-CM | POA: Insufficient documentation

## 2012-06-08 DIAGNOSIS — F17200 Nicotine dependence, unspecified, uncomplicated: Secondary | ICD-10-CM | POA: Insufficient documentation

## 2012-06-08 DIAGNOSIS — Z880 Allergy status to penicillin: Secondary | ICD-10-CM | POA: Insufficient documentation

## 2012-06-08 DIAGNOSIS — K648 Other hemorrhoids: Secondary | ICD-10-CM | POA: Insufficient documentation

## 2012-06-08 DIAGNOSIS — Z9861 Coronary angioplasty status: Secondary | ICD-10-CM | POA: Insufficient documentation

## 2012-06-08 DIAGNOSIS — I10 Essential (primary) hypertension: Secondary | ICD-10-CM | POA: Insufficient documentation

## 2012-06-08 DIAGNOSIS — I2119 ST elevation (STEMI) myocardial infarction involving other coronary artery of inferior wall: Secondary | ICD-10-CM

## 2012-06-08 DIAGNOSIS — K625 Hemorrhage of anus and rectum: Secondary | ICD-10-CM | POA: Insufficient documentation

## 2012-06-08 DIAGNOSIS — M129 Arthropathy, unspecified: Secondary | ICD-10-CM | POA: Insufficient documentation

## 2012-06-08 DIAGNOSIS — Z79899 Other long term (current) drug therapy: Secondary | ICD-10-CM | POA: Insufficient documentation

## 2012-06-08 DIAGNOSIS — F1210 Cannabis abuse, uncomplicated: Secondary | ICD-10-CM | POA: Insufficient documentation

## 2012-06-08 HISTORY — DX: Acute myocardial infarction, unspecified: I21.9

## 2012-06-08 HISTORY — DX: Melena: K92.1

## 2012-06-08 HISTORY — DX: Unspecified osteoarthritis, unspecified site: M19.90

## 2012-06-08 HISTORY — PX: COLONOSCOPY: SHX5424

## 2012-06-08 SURGERY — COLONOSCOPY
Anesthesia: Moderate Sedation

## 2012-06-08 MED ORDER — MIDAZOLAM HCL 10 MG/2ML IJ SOLN
INTRAMUSCULAR | Status: DC | PRN
  Administered 2012-06-08 (×2): 2 mg via INTRAVENOUS
  Administered 2012-06-08 (×3): 1 mg via INTRAVENOUS

## 2012-06-08 MED ORDER — DIPHENHYDRAMINE HCL 50 MG/ML IJ SOLN
INTRAMUSCULAR | Status: AC
  Filled 2012-06-08: qty 1

## 2012-06-08 MED ORDER — FENTANYL CITRATE 0.05 MG/ML IJ SOLN
INTRAMUSCULAR | Status: DC | PRN
  Administered 2012-06-08 (×4): 25 ug via INTRAVENOUS
  Administered 2012-06-08 (×2): 12.5 ug via INTRAVENOUS

## 2012-06-08 MED ORDER — SODIUM CHLORIDE 0.9 % IV SOLN
INTRAVENOUS | Status: DC
  Administered 2012-06-08: 09:00:00 via INTRAVENOUS

## 2012-06-08 MED ORDER — SODIUM CHLORIDE 0.9 % IV SOLN: INTRAVENOUS | Status: DC

## 2012-06-08 MED ORDER — MIDAZOLAM HCL 5 MG/ML IJ SOLN
INTRAMUSCULAR | Status: AC
  Filled 2012-06-08: qty 3

## 2012-06-08 MED ORDER — FENTANYL CITRATE 0.05 MG/ML IJ SOLN
INTRAMUSCULAR | Status: AC
  Filled 2012-06-08: qty 4

## 2012-06-08 NOTE — H&P (Signed)
   [facility] Admission History & Physical  Chief Complaint: Rectal bleeding HPI: Frank Flowers is an 52 y.o.    male.  Presents for intermittent rectal bleeding. He has never had any previous colon screening  Past Medical History  Diagnosis Date  . Hypertension   . Myocardial infarction Jan 2013  . Arthritis   . Blood in stool     Past Surgical History  Procedure Laterality Date  . Coronary angioplasty with stent placement      Medications Prior to Admission  Medication Sig Dispense Refill  . clopidogrel (PLAVIX) 75 MG tablet Take 75 mg by mouth daily.      Marland Kitchen lisinopril (PRINIVIL,ZESTRIL) 10 MG tablet Take 10 mg by mouth daily.      . metoprolol tartrate (LOPRESSOR) 25 MG tablet Take 1 tablet (25 mg total) by mouth 2 (two) times daily.  60 tablet  11  . rosuvastatin (CRESTOR) 10 MG tablet Take 10 mg by mouth daily.      . simvastatin (ZOCOR) 40 MG tablet Take 1 tablet (40 mg total) by mouth daily at 6 PM.  30 tablet  11  . [DISCONTINUED] lisinopril (PRINIVIL,ZESTRIL) 10 MG tablet Take 1 tablet (10 mg total) by mouth daily.  30 tablet  11  . Ticagrelor (BRILINTA) 90 MG TABS tablet Take 1 tablet (90 mg total) by mouth 2 (two) times daily.  60 tablet  11    Allergies:  Allergies  Allergen Reactions  . Penicillins Other (See Comments)    Childhood allergy    History reviewed. No pertinent family history.  Social History:  reports that he has been smoking Cigarettes.  He has been smoking about 1.00 pack per day. He does not have any smokeless tobacco history on file. He reports that  drinks alcohol. He reports that he uses illicit drugs (Marijuana).  Review of Systems: negative except as above   Blood pressure 102/77, temperature 97.8 F (36.6 C), temperature source Oral, resp. rate 20, height 6\' 2"  (1.88 m), weight 122.471 kg (270 lb), SpO2 96.00%. Head: Normocephalic, without obvious abnormality, atraumatic Neck: no adenopathy, no carotid bruit, no JVD,  supple, symmetrical, trachea midline and thyroid not enlarged, symmetric, no tenderness/mass/nodules Resp: clear to auscultation bilaterally Cardio: regular rate and rhythm, S1, S2 normal, no murmur, click, rub or gallop GI: Abdomen soft Extremities: extremities normal, atraumatic, no cyanosis or edema  No results found for this or any previous visit (from the past 48 hour(s)). No results found.  Assessment: Rectal bleeding Plan: Proceed with colonoscopy  Shaqueta Casady,Marti C 06/08/2012, 8:57 AM

## 2012-06-08 NOTE — Op Note (Signed)
[facility] [address] [place], [address]   COLONOSCOPY PROCEDURE REPORT  PATIENT: Moody, Robben  MR#: [id number] BIRTHDATE: Aug 19, 1960 , 52  yrs. old GENDER: Male ENDOSCOPIST: Dorena Cookey, MD REFERRED BY: PROCEDURE DATE:  06/08/2012 PROCEDURE: ASA CLASS: INDICATIONS:  rectal bleeding MEDICATIONS: fentanyl 125 mcg Versed 7 mg  DESCRIPTION OF PROCEDURE: the Pentax pediatric colonoscope was inserted into the rectum and advanced to the cecum, confirmed by transillumination at McBurney's point in visualization of the ileocecal valve and appendiceal orifice. The prep was excellent. The cecum, ascending, transverse and descending colon all appeared normal with no masses polyps diverticula or other mucosal abnormalities. The sigmoid and rectum also appeared normal. Retroflex view the anus did reveal some small internal hemorrhoids.     COMPLICATIONS: None  ENDOSCOPIC IMPRESSION:small internal hemorrhoids, otherwise normal colonoscopy to  RECOMMENDATIONS:repeat colonoscopy in 10 years.    _______________________________ Rosalie DoctorDorena Cookey, MD 06/08/2012 9:40 AM

## 2012-07-06 NOTE — Addendum Note (Signed)
Addended by: Dorena Cookey on: 07/06/2012 10:45 AM   Modules accepted: Orders

## 2012-10-14 ENCOUNTER — Encounter: Payer: Self-pay | Admitting: *Deleted

## 2012-10-14 ENCOUNTER — Encounter: Payer: Self-pay | Admitting: Interventional Cardiology

## 2012-10-18 ENCOUNTER — Ambulatory Visit: Payer: Self-pay | Admitting: Interventional Cardiology

## 2013-08-27 ENCOUNTER — Emergency Department (HOSPITAL_COMMUNITY): Payer: Self-pay

## 2013-08-27 ENCOUNTER — Emergency Department (HOSPITAL_COMMUNITY)
Admission: EM | Admit: 2013-08-27 | Discharge: 2013-08-27 | Disposition: A | Discharge status: Home / Self Care | Payer: Self-pay | Attending: Emergency Medicine | Admitting: Emergency Medicine

## 2013-08-27 ENCOUNTER — Telehealth: Payer: Self-pay

## 2013-08-27 ENCOUNTER — Encounter (HOSPITAL_COMMUNITY): Payer: Self-pay | Admitting: Emergency Medicine

## 2013-08-27 DIAGNOSIS — M25569 Pain in unspecified knee: Secondary | ICD-10-CM | POA: Insufficient documentation

## 2013-08-27 DIAGNOSIS — G8929 Other chronic pain: Secondary | ICD-10-CM | POA: Insufficient documentation

## 2013-08-27 DIAGNOSIS — Z79899 Other long term (current) drug therapy: Secondary | ICD-10-CM | POA: Insufficient documentation

## 2013-08-27 DIAGNOSIS — Z880 Allergy status to penicillin: Secondary | ICD-10-CM | POA: Insufficient documentation

## 2013-08-27 DIAGNOSIS — F17200 Nicotine dependence, unspecified, uncomplicated: Secondary | ICD-10-CM | POA: Insufficient documentation

## 2013-08-27 DIAGNOSIS — M129 Arthropathy, unspecified: Secondary | ICD-10-CM | POA: Insufficient documentation

## 2013-08-27 DIAGNOSIS — I10 Essential (primary) hypertension: Secondary | ICD-10-CM | POA: Insufficient documentation

## 2013-08-27 DIAGNOSIS — I252 Old myocardial infarction: Secondary | ICD-10-CM | POA: Insufficient documentation

## 2013-08-27 DIAGNOSIS — Z7902 Long term (current) use of antithrombotics/antiplatelets: Secondary | ICD-10-CM | POA: Insufficient documentation

## 2013-08-27 DIAGNOSIS — Z8719 Personal history of other diseases of the digestive system: Secondary | ICD-10-CM | POA: Insufficient documentation

## 2013-08-27 DIAGNOSIS — Z7982 Long term (current) use of aspirin: Secondary | ICD-10-CM | POA: Insufficient documentation

## 2013-08-27 DIAGNOSIS — M25561 Pain in right knee: Secondary | ICD-10-CM

## 2013-08-27 DIAGNOSIS — R079 Chest pain, unspecified: Secondary | ICD-10-CM | POA: Insufficient documentation

## 2013-08-27 DIAGNOSIS — R0789 Other chest pain: Secondary | ICD-10-CM | POA: Insufficient documentation

## 2013-08-27 LAB — BASIC METABOLIC PANEL
Anion gap: 12 (ref 5–15)
BUN: 15 mg/dL (ref 6–23)
CO2: 28 mEq/L (ref 19–32)
Calcium: 9.3 mg/dL (ref 8.4–10.5)
Chloride: 103 mEq/L (ref 96–112)
Creatinine, Ser: 1.09 mg/dL (ref 0.50–1.35)
GFR calc Af Amer: 88 mL/min — ABNORMAL LOW (ref >90)
GFR calc non Af Amer: 76 mL/min — ABNORMAL LOW (ref >90)
Glucose, Bld: 84 mg/dL (ref 70–99)
Potassium: 4 mEq/L (ref 3.7–5.3)
Sodium: 143 mEq/L (ref 137–147)

## 2013-08-27 LAB — CBC WITH DIFFERENTIAL/PLATELET
Basophils Absolute: 0 10*3/uL (ref 0.0–0.1)
Basophils Relative: 0 % (ref 0–1)
Eosinophils Absolute: 0.1 10*3/uL (ref 0.0–0.7)
Eosinophils Relative: 1 % (ref 0–5)
HCT: 52.6 % — ABNORMAL HIGH (ref 39.0–52.0)
Hemoglobin: 18.4 g/dL — ABNORMAL HIGH (ref 13.0–17.0)
Lymphocytes Relative: 33 % (ref 12–46)
Lymphs Abs: 2.5 10*3/uL (ref 0.7–4.0)
MCH: 32.3 pg (ref 26.0–34.0)
MCHC: 35 g/dL (ref 30.0–36.0)
MCV: 92.3 fL (ref 78.0–100.0)
Monocytes Absolute: 0.7 10*3/uL (ref 0.1–1.0)
Monocytes Relative: 9 % (ref 3–12)
Neutro Abs: 4.4 10*3/uL (ref 1.7–7.7)
Neutrophils Relative %: 57 % (ref 43–77)
Platelets: 182 10*3/uL (ref 150–400)
RBC: 5.7 MIL/uL (ref 4.22–5.81)
RDW: 15.1 % (ref 11.5–15.5)
WBC: 7.7 10*3/uL (ref 4.0–10.5)

## 2013-08-27 LAB — TROPONIN I: Troponin I: <0.3 ng/mL (ref <0.30)

## 2013-08-27 MED ORDER — IBUPROFEN 800 MG PO TABS: 800.0000 mg | ORAL_TABLET | Freq: Three times a day (TID) | ORAL | Status: DC | PRN

## 2013-08-27 MED ORDER — HYDROCODONE-ACETAMINOPHEN 5-325 MG PO TABS: 1.0000 | ORAL_TABLET | ORAL | Status: DC | PRN

## 2013-08-27 MED ORDER — HYDROCODONE-ACETAMINOPHEN 5-325 MG PO TABS
2.0000 | ORAL_TABLET | Freq: Once | ORAL | Status: AC
  Administered 2013-08-27: 2 via ORAL
  Filled 2013-08-27: qty 2

## 2013-08-27 NOTE — ED Provider Notes (Signed)
TIME SEEN: 5:48 PM  CHIEF COMPLAINT: Right knee pain, chest pain  HPI: Patient is a 53 y.o. M with history of hypertension, prior STEMI in 2013 who presents emergency department complaining of right knee pain. He states that he has had this pain intermittently for several years and has been worse for the past 4 months unbearable over the past few days. Denies a history of injury. He CTs had negative x-rays before. No numbness, tingling or focal weakness. No calf pain or calf swelling. No fever. No warmth or redness. He also complains of diffuse sharp chest pain that is also been intermittent since 2013. He states this does not feel at all like his prior heart attacks. His last episode of chest pain was 3 days ago. No chest pain currently. No associated shortness of breath, diaphoresis, dizziness, nausea or vomiting. He states he is here because he would like a cortisone injection in his knee.  ROS: See HPI Constitutional: no fever  Eyes: no drainage  ENT: no runny nose   Cardiovascular:  no chest pain  Resp: no SOB  GI: no vomiting GU: no dysuria Integumentary: no rash  Allergy: no hives  Musculoskeletal: no leg swelling  Neurological: no slurred speech ROS otherwise negative  PAST MEDICAL HISTORY/PAST SURGICAL HISTORY:  Past Medical History  Diagnosis Date  . Hypertension   . Myocardial infarction Jan 2013  . Arthritis   . Blood in stool   . Rectal bleeding   . Vertigo   . Chest pain     MEDICATIONS:  Prior to Admission medications   Medication Sig Start Date End Date Taking? Authorizing Provider  acetaminophen (TYLENOL) 500 MG tablet Take 500 mg by mouth every 6 (six) hours as needed for mild pain or moderate pain.   Yes Historical Provider, MD  aspirin EC 81 MG tablet Take 81 mg by mouth daily.   Yes Historical Provider, MD  rosuvastatin (CRESTOR) 10 MG tablet Take 10 mg by mouth daily.   Yes Historical Provider, MD  clopidogrel (PLAVIX) 75 MG tablet Take 75 mg by mouth  daily.    Historical Provider, MD  lisinopril (PRINIVIL,ZESTRIL) 10 MG tablet Take 10 mg by mouth daily. 02/01/11 06/08/12  Corky CraftsJayadeep S Varanasi, MD  metoprolol tartrate (LOPRESSOR) 25 MG tablet Take 1 tablet (25 mg total) by mouth 2 (two) times daily. 02/01/11 06/08/12  Corky CraftsJayadeep S Varanasi, MD    ALLERGIES:  Allergies  Allergen Reactions  . Penicillins Other (See Comments)    Childhood allergy    SOCIAL HISTORY:  History  Substance Use Topics  . Smoking status: Current Some Day Smoker -- 1.00 packs/day    Types: Cigarettes  . Smokeless tobacco: Not on file  . Alcohol Use: 0.0 oz/week    7-14 Cans of beer per week     Comment: every day    FAMILY HISTORY: No family history on file.  EXAM: BP 123/85  Pulse 85  Temp(Src) 98.3 F (36.8 C) (Oral)  Resp 18  Ht 6\' 2"  (1.88 m)  Wt 270 lb (122.471 kg)  BMI 34.65 kg/m2  SpO2 98% CONSTITUTIONAL: Alert and oriented and responds appropriately to questions. Well-appearing; well-nourished HEAD: Normocephalic EYES: Conjunctivae clear, PERRL ENT: normal nose; no rhinorrhea; moist mucous membranes; pharynx without lesions noted NECK: Supple, no meningismus, no LAD  CARD: RRR; S1 and S2 appreciated; no murmurs, no clicks, no rubs, no gallops RESP: Normal chest excursion without splinting or tachypnea; breath sounds clear and equal bilaterally; no wheezes, no rhonchi,  no rales,  ABD/GI: Normal bowel sounds; non-distended; soft, non-tender, no rebound, no guarding BACK:  The back appears normal and is non-tender to palpation, there is no CVA tenderness EXT: Tender to palpation over the medial joint line, no joint effusion, erythema or warmth, no ligamentous laxity, 2+ DP pulses bilaterally, sensation to light touch intact diffusely, no bony deformity or bony tenderness, Normal ROM in all joints; otherwise extremities are non-tender to palpation; no edema; normal capillary refill; no cyanosis    SKIN: Normal color for age and race; warm NEURO:  Moves all extremities equally PSYCH: The patient's mood and manner are appropriate. Grooming and personal hygiene are appropriate.  MEDICAL DECISION MAKING: Patient here with complaints of primarily right knee pain is been chronic for several years. No history of any injury. No sign of septic arthritis. Neurovascularly intact distally. X-ray ordered in triage shows arthritis. Suspect that versus chronic meniscal injury. When the patient that we do not perform cortisone injections in the emergency department. I recommended medical therapy with anti-inflammatories, pain medication, rest, elevation and ice. Will give orthopedic followup information and primary care information for outpatient followup if medical therapy does not work.   He is also very atypical chest pain. This denies that is similar to his prior episode of cardiac chest pain. His last episode was 3 days ago. Labs ordered in triage are unremarkable. EKG shows no new ischemic changes. Chest x-ray clear. He denies any chest pain currently. I feel he is safe for discharge home. Doubt PE or dissection given his chest pain-free. We'll have him followup with his cardiologist as an outpatient. Discussed strict return precautions. Patient verbalizes understanding and is comfortable with plan.    Date: 08/27/2013 15:47  Rate: 79  Rhythm: RAD  QRS Axis: normal  Intervals: normal  ST/T Wave abnormalities: normal  Conduction Disutrbances: none  Narrative Interpretation: Normal sinus rhythm, right axis deviation, nonspecific ST changes in inferior and lateral leads are unchanged compared to 1113        [employer], DO 08/27/13 1827

## 2013-08-27 NOTE — Discharge Instructions (Signed)
Arthritis, Nonspecific °Arthritis is inflammation of a joint. This usually means pain, redness, warmth or swelling are present. One or more joints may be involved. There are a number of types of arthritis. Your caregiver may not be able to tell what type of arthritis you have right away. °CAUSES  °The most common cause of arthritis is the wear and tear on the joint (osteoarthritis). This causes damage to the cartilage, which can break down over time. The knees, hips, back and neck are most often affected by this type of arthritis. °Other types of arthritis and common causes of joint pain include: °· Sprains and other injuries near the joint. Sometimes minor sprains and injuries cause pain and swelling that develop hours later. °· Rheumatoid arthritis. This affects hands, feet and knees. It usually affects both sides of your body at the same time. It is often associated with chronic ailments, fever, weight loss and general weakness. °· Crystal arthritis. Gout and pseudo gout can cause occasional acute severe pain, redness and swelling in the foot, ankle, or knee. °· Infectious arthritis. Bacteria can get into a joint through a break in overlying skin. This can cause infection of the joint. Bacteria and viruses can also spread through the blood and affect your joints. °· Drug, infectious and allergy reactions. Sometimes joints can become mildly painful and slightly swollen with these types of illnesses. °SYMPTOMS  °· Pain is the main symptom. °· Your joint or joints can also be red, swollen and warm or hot to the touch. °· You may have a fever with certain types of arthritis, or even feel overall ill. °· The joint with arthritis will hurt with movement. Stiffness is present with some types of arthritis. °DIAGNOSIS  °Your caregiver will suspect arthritis based on your description of your symptoms and on your exam. Testing may be needed to find the type of arthritis: °· Blood and sometimes urine tests. °· X-ray tests  and sometimes CT or MRI scans. °· Removal of fluid from the joint (arthrocentesis) is done to check for bacteria, crystals or other causes. Your caregiver (or a specialist) will numb the area over the joint with a local anesthetic, and use a needle to remove joint fluid for examination. This procedure is only minimally uncomfortable. °· Even with these tests, your caregiver may not be able to tell what kind of arthritis you have. Consultation with a specialist (rheumatologist) may be helpful. °TREATMENT  °Your caregiver will discuss with you treatment specific to your type of arthritis. If the specific type cannot be determined, then the following general recommendations may apply. °Treatment of severe joint pain includes: °· Rest. °· Elevation. °· Anti-inflammatory medication (for example, ibuprofen) may be prescribed. Avoiding activities that cause increased pain. °· Only take over-the-counter or prescription medicines for pain and discomfort as recommended by your caregiver. °· Cold packs over an inflamed joint may be used for 10 to 15 minutes every hour. Hot packs sometimes feel better, but do not use overnight. Do not use hot packs if you are diabetic without your caregiver's permission. °· A cortisone shot into arthritic joints may help reduce pain and swelling. °· Any acute arthritis that gets worse over the next 1 to 2 days needs to be looked at to be sure there is no joint infection. °Long-term arthritis treatment involves modifying activities and lifestyle to reduce joint stress jarring. This can include weight loss. Also, exercise is needed to nourish the joint cartilage and remove waste. This helps keep the muscles   around the joint strong. °HOME CARE INSTRUCTIONS  °· Do not take aspirin to relieve pain if gout is suspected. This elevates uric acid levels. °· Only take over-the-counter or prescription medicines for pain, discomfort or fever as directed by your caregiver. °· Rest the joint as much as  possible. °· If your joint is swollen, keep it elevated. °· Use crutches if the painful joint is in your leg. °· Drinking plenty of fluids may help for certain types of arthritis. °· Follow your caregiver's dietary instructions. °· Try low-impact exercise such as: °¨ Swimming. °¨ Water aerobics. °¨ Biking. °¨ Walking. °· Morning stiffness is often relieved by a warm shower. °· Put your joints through regular range-of-motion. °SEEK MEDICAL CARE IF:  °· You do not feel better in 24 hours or are getting worse. °· You have side effects to medications, or are not getting better with treatment. °SEEK IMMEDIATE MEDICAL CARE IF:  °· You have a fever. °· You develop severe joint pain, swelling or redness. °· Many joints are involved and become painful and swollen. °· There is severe back pain and/or leg weakness. °· You have loss of bowel or bladder control. °Document Released: 02/05/2004 Document Revised: 03/22/2011 Document Reviewed: 02/21/2008 °ExitCare® Patient Information ©2015 [employer]. This information is not intended to replace advice given to you by your health care provider. Make sure you discuss any questions you have with your health care provider. °RICE: Routine Care for Injuries °The routine care of many injuries includes Rest, Ice, Compression, and Elevation (RICE). °HOME CARE INSTRUCTIONS °· Rest is needed to allow your body to heal. Routine activities can usually be resumed when comfortable. Injured tendons and bones can take up to 6 weeks to heal. Tendons are the cord-like structures that attach muscle to bone. °· Ice following an injury helps keep the swelling down and reduces pain. °¨ Put ice in a plastic bag. °¨ Place a towel between your skin and the bag. °¨ Leave the ice on for 15-20 minutes, 3-4 times a day, or as directed by your health care provider. Do this while awake, for the first 24 to 48 hours. After that, continue as directed by your caregiver. °· Compression helps keep swelling down. It  also gives support and helps with discomfort. If an elastic bandage has been applied, it should be removed and reapplied every 3 to 4 hours. It should not be applied tightly, but firmly enough to keep swelling down. Watch fingers or toes for swelling, bluish discoloration, coldness, numbness, or excessive pain. If any of these problems occur, remove the bandage and reapply loosely. Contact your caregiver if these problems continue. °· Elevation helps reduce swelling and decreases pain. With extremities, such as the arms, hands, legs, and feet, the injured area should be placed near or above the level of the heart, if possible. °SEEK IMMEDIATE MEDICAL CARE IF: °· You have persistent pain and swelling. °· You develop redness, numbness, or unexpected weakness. °· Your symptoms are getting worse rather than improving after several days. °These symptoms may indicate that further evaluation or further X-rays are needed. Sometimes, X-rays may not show a small broken bone (fracture) until 1 week or 10 days later. Make a follow-up appointment with your caregiver. Ask when your X-ray results will be ready. Make sure you get your X-ray results. °Document Released: 04/11/2000 Document Revised: 01/02/2013 Document Reviewed: 05/29/2010 °ExitCare® Patient Information ©2015 [employer]. This information is not intended to replace advice given to you by your health care   provider. Make sure you discuss any questions you have with your health care provider. Knee Pain The knee is the complex joint between your thigh and your lower leg. It is made up of bones, tendons, ligaments, and cartilage. The bones that make up the knee are:  The femur in the thigh.  The tibia and fibula in the lower leg.  The patella or kneecap riding in the groove on the lower femur. CAUSES  Knee pain is a common complaint with many causes. A few of these causes are:  Injury, such as:  A ruptured ligament or tendon injury.  Torn  cartilage.  Medical conditions, such as:  Gout  Arthritis  Infections  Overuse, over training, or overdoing a physical activity. Knee pain can be minor or severe. Knee pain can accompany debilitating injury. Minor knee problems often respond well to self-care measures or get well on their own. More serious injuries may need medical intervention or even surgery. SYMPTOMS The knee is complex. Symptoms of knee problems can vary widely. Some of the problems are:  Pain with movement and weight bearing.  Swelling and tenderness.  Buckling of the knee.  Inability to straighten or extend your knee.  Your knee locks and you cannot straighten it.  Warmth and redness with pain and fever.  Deformity or dislocation of the kneecap. DIAGNOSIS  Determining what is wrong may be very straight forward such as when there is an injury. It can also be challenging because of the complexity of the knee. Tests to make a diagnosis may include:  Your caregiver taking a history and doing a physical exam.  Routine X-rays can be used to rule out other problems. X-rays will not reveal a cartilage tear. Some injuries of the knee can be diagnosed by:  Arthroscopy a surgical technique by which a small video camera is inserted through tiny incisions on the sides of the knee. This procedure is used to examine and repair internal knee joint problems. Tiny instruments can be used during arthroscopy to repair the torn knee cartilage (meniscus).  Arthrography is a radiology technique. A contrast liquid is directly injected into the knee joint. Internal structures of the knee joint then become visible on X-ray film.  An MRI scan is a non X-ray radiology procedure in which magnetic fields and a computer produce two- or three-dimensional images of the inside of the knee. Cartilage tears are often visible using an MRI scanner. MRI scans have largely replaced arthrography in diagnosing cartilage tears of the  knee.  Blood work.  Examination of the fluid that helps to lubricate the knee joint (synovial fluid). This is done by taking a sample out using a needle and a syringe. TREATMENT The treatment of knee problems depends on the cause. Some of these treatments are:  Depending on the injury, proper casting, splinting, surgery, or physical therapy care will be needed.  Give yourself adequate recovery time. Do not overuse your joints. If you begin to get sore during workout routines, back off. Slow down or do fewer repetitions.  For repetitive activities such as cycling or running, maintain your strength and nutrition.  Alternate muscle groups. For example, if you are a weight lifter, work the upper body on one day and the lower body the next.  Either tight or weak muscles do not give the proper support for your knee. Tight or weak muscles do not absorb the stress placed on the knee joint. Keep the muscles surrounding the knee strong.  Take care  of mechanical problems.  If you have flat feet, orthotics or special shoes may help. See your caregiver if you need help.  Arch supports, sometimes with wedges on the inner or outer aspect of the heel, can help. These can shift pressure away from the side of the knee most bothered by osteoarthritis.  A brace called an "unloader" brace also may be used to help ease the pressure on the most arthritic side of the knee.  If your caregiver has prescribed crutches, braces, wraps or ice, use as directed. The acronym for this is PRICE. This means protection, rest, ice, compression, and elevation.  Nonsteroidal anti-inflammatory drugs (NSAIDs), can help relieve pain. But if taken immediately after an injury, they may actually increase swelling. Take NSAIDs with food in your stomach. Stop them if you develop stomach problems. Do not take these if you have a history of ulcers, stomach pain, or bleeding from the bowel. Do not take without your caregiver's approval if  you have problems with fluid retention, heart failure, or kidney problems.  For ongoing knee problems, physical therapy may be helpful.  Glucosamine and chondroitin are over-the-counter dietary supplements. Both may help relieve the pain of osteoarthritis in the knee. These medicines are different from the usual anti-inflammatory drugs. Glucosamine may decrease the rate of cartilage destruction.  Injections of a corticosteroid drug into your knee joint may help reduce the symptoms of an arthritis flare-up. They may provide pain relief that lasts a few months. You may have to wait a few months between injections. The injections do have a small increased risk of infection, water retention, and elevated blood sugar levels.  Hyaluronic acid injected into damaged joints may ease pain and provide lubrication. These injections may work by reducing inflammation. A series of shots may give relief for as long as 6 months.  Topical painkillers. Applying certain ointments to your skin may help relieve the pain and stiffness of osteoarthritis. Ask your pharmacist for suggestions. Many over the-counter products are approved for temporary relief of arthritis pain.  In some countries, doctors often prescribe topical NSAIDs for relief of chronic conditions such as arthritis and tendinitis. A review of treatment with NSAID creams found that they worked as well as oral medications but without the serious side effects. PREVENTION  Maintain a healthy weight. Extra pounds put more strain on your joints.  Get strong, stay limber. Weak muscles are a common cause of knee injuries. Stretching is important. Include flexibility exercises in your workouts.  Be smart about exercise. If you have osteoarthritis, chronic knee pain or recurring injuries, you may need to change the way you exercise. This does not mean you have to stop being active. If your knees ache after jogging or playing basketball, consider switching to  swimming, water aerobics, or other low-impact activities, at least for a few days a week. Sometimes limiting high-impact activities will provide relief.  Make sure your shoes fit well. Choose footwear that is right for your sport.  Protect your knees. Use the proper gear for knee-sensitive activities. Use kneepads when playing volleyball or laying carpet. Buckle your seat belt every time you drive. Most shattered kneecaps occur in car accidents.  Rest when you are tired. SEEK MEDICAL CARE IF:  You have knee pain that is continual and does not seem to be getting better.  SEEK IMMEDIATE MEDICAL CARE IF:  Your knee joint feels hot to the touch and you have a high fever. MAKE SURE YOU:   Understand these instructions.  Will watch your condition.  Will get help right away if you are not doing well or get worse. Document Released: 10/25/2006 Document Revised: 03/22/2011 Document Reviewed: 10/25/2006 [facility] Patient Information 2015 [place], [place]. This information is not intended to replace advice given to you by your health care provider. Make sure you discuss any questions you have with your health care provider.   Chest Pain (Nonspecific) It is often hard to give a specific diagnosis for the cause of chest pain. There is always a chance that your pain could be related to something serious, such as a heart attack or a blood clot in the lungs. You need to follow up with your health care provider for further evaluation. CAUSES   Heartburn.  Pneumonia or bronchitis.  Anxiety or stress.  Inflammation around your heart (pericarditis) or lung (pleuritis or pleurisy).  A blood clot in the lung.  A collapsed lung (pneumothorax). It can develop suddenly on its own (spontaneous pneumothorax) or from trauma to the chest.  Shingles infection (herpes zoster virus). The chest wall is composed of bones, muscles, and cartilage. Any of these can be the source of the pain.  The bones can be  bruised by injury.  The muscles or cartilage can be strained by coughing or overwork.  The cartilage can be affected by inflammation and become sore (costochondritis). DIAGNOSIS  Lab tests or other studies may be needed to find the cause of your pain. Your health care provider may have you take a test called an ambulatory electrocardiogram (ECG). An ECG records your heartbeat patterns over a 24-hour period. You may also have other tests, such as:  Transthoracic echocardiogram (TTE). During echocardiography, sound waves are used to evaluate how blood flows through your heart.  Transesophageal echocardiogram (TEE).  Cardiac monitoring. This allows your health care provider to monitor your heart rate and rhythm in real time.  Holter monitor. This is a portable device that records your heartbeat and can help diagnose heart arrhythmias. It allows your health care provider to track your heart activity for several days, if needed.  Stress tests by exercise or by giving medicine that makes the heart beat faster. TREATMENT   Treatment depends on what may be causing your chest pain. Treatment may include:  Acid blockers for heartburn.  Anti-inflammatory medicine.  Pain medicine for inflammatory conditions.  Antibiotics if an infection is present.  You may be advised to change lifestyle habits. This includes stopping smoking and avoiding alcohol, caffeine, and chocolate.  You may be advised to keep your head raised (elevated) when sleeping. This reduces the chance of acid going backward from your stomach into your esophagus. Most of the time, nonspecific chest pain will improve within 2-3 days with rest and mild pain medicine.  HOME CARE INSTRUCTIONS   If antibiotics were prescribed, take them as directed. Finish them even if you start to feel better.  For the next few days, avoid physical activities that bring on chest pain. Continue physical activities as directed.  Do not use any  tobacco products, including cigarettes, chewing tobacco, or electronic cigarettes.  Avoid drinking alcohol.  Only take medicine as directed by your health care provider.  Follow your health care provider's suggestions for further testing if your chest pain does not go away.  Keep any follow-up appointments you made. If you do not go to an appointment, you could develop lasting (chronic) problems with pain. If there is any problem keeping an appointment, call to reschedule. SEEK MEDICAL CARE IF:  Your chest pain does not go away, even after treatment.  You have a rash with blisters on your chest.  You have a fever. SEEK IMMEDIATE MEDICAL CARE IF:   You have increased chest pain or pain that spreads to your arm, neck, jaw, back, or abdomen.  You have shortness of breath.  You have an increasing cough, or you cough up blood.  You have severe back or abdominal pain.  You feel nauseous or vomit.  You have severe weakness.  You faint.  You have chills. This is an emergency. Do not wait to see if the pain will go away. Get medical help at once. Call your local emergency services (911 in [place] Do not drive yourself to the hospital. MAKE SURE YOU:   Understand these instructions.  Will watch your condition.  Will get help right away if you are not doing well or get worse. Document Released: 10/07/2004 Document Revised: 01/02/2013 Document Reviewed: 08/03/2007 [facility] Patient Information 2015 [place], [place]. This information is not intended to replace advice given to you by your health care provider. Make sure you discuss any questions you have with your health care provider.    Emergency Department Resource Guide 1) Find a Doctor and Pay Out of Pocket Although you won't have to find out who is covered by your insurance plan, it is a good idea to ask around and get recommendations. You will then need to call the office and see if the doctor you have chosen will accept you as  a new patient and what types of options they offer for patients who are self-pay. Some doctors offer discounts or will set up payment plans for their patients who do not have insurance, but you will need to ask so you aren't surprised when you get to your appointment.  2) Contact Your Local Health Department Not all health departments have doctors that can see patients for sick visits, but many do, so it is worth a call to see if yours does. If you don't know where your local health department is, you can check in your phone book. The CDC also has a tool to help you locate your state's health department, and many state websites also have listings of all of their local health departments.  3) Find a Walk-in Clinic If your illness is not likely to be very severe or complicated, you may want to try a walk in clinic. These are popping up all over the country in pharmacies, drugstores, and shopping centers. They're usually staffed by nurse practitioners or physician assistants that have been trained to treat common illnesses and complaints. They're usually fairly quick and inexpensive. However, if you have serious medical issues or chronic medical problems, these are probably not your best option.  No Primary Care Doctor: - Call [facility] at  [phone] - they can help you locate a primary care doctor that  accepts your insurance, provides certain services, etc. - Physician Referral Service- [phone]  Chronic Pain Problems: Organization         Address  Phone   Notes  [facility]  [phone] Patients need to be referred by their primary care doctor.   Medication Assistance: Organization         Address  Phone   Notes  [facility] Medication [facility] [address] [place] [place], [place] [address] [phone] --Must be a resident of [facility] -- Must have NO insurance coverage whatsoever (no Medicaid/ Medicare,  etc.) -- The pt. MUST have a  primary care doctor that directs their care regularly and follows them in the community   [facility]  [phone]   [employer]  [phone]    Agencies that provide inexpensive medical care: Organization         Address  Phone   Notes  [facility]  [phone]   [facility]    [phone]   [facility] [address] [place], [place] [address] [phone]   [facility] [address] [place] [place] [phone]   [facility]    [phone]   [facility]    [phone]   [facility]  [address], [place] Phone:  [phone], Fax:  [phone] Hours of Operation:  9 am - 6 pm, M-F.  Also accepts Medicaid/Medicare and self-pay.  [facility]  [address], [place] Phone: [phone], Fax: [phone]. Hours of Operation:  8:30 am - 5:30 pm, M-F.  Also accepts Medicaid and self-pay.  [facility] [place] [address] [place] Phone: [phone]   [facility] [address] Natasha Bence [place], [place] [phone], Ext. [phone] Mondays & Thursdays: 7-9 AM.  First 15 patients are seen on a first come, first serve basis.    Medicaid-accepting [facility] Providers:  Organization         Address  Phone   Notes  [facility] [address], [place] [phone] Also accepts self-pay patients.  [facility] [address] Laurell Josephs [place], [place]  [phone]   [facility] [address], [place] [phone]   [facility] [address], [place] [phone]   Renaye Rakers [address], [place]   [phone] Only accepts [place] patients after they have their name applied to their card.   Self-Pay (no insurance) in [facility]:  Organization         Address  Phone   Notes  Sickle Cell Patients, [facility] [address] [place], [place] [phone]   [facility] [address] [place], [place] [phone]   [facility] [place]  [address], [place]  [phone]   [facility]/Dr. Osei-Bonsu  [address], [place] or [address], [place] [phone] Phone number for both [place] [place] locations is the same.  [facility] [address], [place] [phone]   [facility] [address] [place] or [address] [phone]   [facility] [address], [place] [phone], phone; [phone], fax Sees patients 1st and 3rd Saturday of every month.  Must not qualify for public or private insurance (i.e. Medicaid, Medicare, [place] Health Choice, Veterans' Benefits)  Household income should be no more than 200% of the poverty level The clinic cannot treat you if you are pregnant or think you are pregnant  Sexually transmitted diseases are not treated at the clinic.    Dental Care: Organization         Address  Phone  Notes  [facility] Department of Public Health [facility] [address], [place] [phone] Accepts children up to age 53 who are enrolled in [place] or [place] Health Choice; pregnant women with a Medicaid card; and children who have applied for Medicaid or [place] Health Choice, but were declined, whose parents can pay a reduced fee at time of service.  [facility] [place]  [address], [place] [phone] Accepts children up to age 53 who are enrolled in [place] or [place] Health Choice; pregnant women with a Medicaid card; and children who have applied for Medicaid or [place] Health Choice, but were declined, whose parents can  pay a reduced fee at time of service.  [facility]  [address], [place] [phone] Patients are seen by appointment only. Walk-ins are not accepted. [facility] will see patients 53 years of age and older. Monday - Tuesday (8am-5pm) Most Wednesdays (8:30-5pm) $30 per visit, cash only  [facility]  [address], [facility] [phone] Patients are seen by appointment only. Walk-ins are not accepted. [facility] will see patients 53 years of age and older. One Wednesday Evening (Monthly: Volunteer Based).  $30 per visit, cash only  [facility]  [phone] for adults; Children under age 164, call Graduate Pediatric Dentistry at [phone]. Children aged 514-14, please call [phone] to request a pediatric application.  Dental services are provided in all areas of dental care including fillings, crowns and bridges, complete and partial dentures, implants, gum treatment, root canals, and extractions. Preventive care is also provided. Treatment is provided to both adults and children. Patients are selected via a lottery and there is often a waiting list.   [facility] [address], [place]  [phone] [email or web address]   [facility] [address], [place], [place] [phone], Ext. [phone] Second and Fourth Thursday of each month, opens at 6:30 AM; Clinic ends at 9 AM.  Patients are seen on a first-come first-served basis, and a limited number are seen during each clinic.   [facility]  [address], [place], [place] [phone]   Eligibility Requirements You must have lived in [place], [place], or [place] counties for at least the last three months.   You cannot be eligible for state or federal sponsored [place]healthcare insurance, including CIGNAVeterans Administration, [place]icaid, or [employer]icare.   You generally cannot be eligible for healthcare  insurance through your employer.    How to apply: Eligibility screenings are held every Tuesday and Wednesday afternoon from 1:00 pm until 4:00 pm. You do not need an appointment for the interview!  [facility] [address], [place], [place] [phone]   [facility]  [phone]   [facility]  [phone]   [facility]  [phone]    Behavioral Health Resources in the Community: Intensive Outpatient Programs Organization         Address  Phone  Notes  [facility] [address], [place], [place] [phone]   [facility] [address], [place], [place] [phone]   [facility] [address], [place], [place]  [phone]   [facility] [address],  [place], [place] [phone] or [phone]   Substance Abuse Resources Organization         Address  Phone  Notes  [facility]  [phone]   [facility]  [phone]   [facility] [place]  [phone]   Floydene Flock  [phone]   Residential & Outpatient Substance Abuse Program  [phone]   Psychological Services Organization         Address  Phone  Notes  [facility]  [phone]   [facility]  [phone]   [facility] [address] [place] [phone] or [phone]    Mobile Crisis Teams Organization         Address  Phone  Notes  [facility], Mobile Crisis Care Unit  [phone]   [facility]  [address] [place], [place] [phone]   Doristine Locks [address] [place] [phone]    Self-Help/Support Groups Organization         Address  Phone             Notes  [facility] [place] - variety of support groups  [phone] Call for more information  Narcotics Anonymous (NA),  [facility] [address], [employer] [place]  2 meetings at this location   Statistician         Address  Phone  Notes  [facility] [address],    [place]  [phone]   [facility]  [address] [place] [address], [place], [place] [phone]   [facility] [address] [place], [place] [phone] Admissions: 8am-3pm M-F  [facility] [address],    [place], [place] [phone]   [facility] [address] [place], [place], [place] [phone]   [facility] [address],  [place], [place] [phone]   [facility] - Intensive Outpatient [address], [place], [place] [phone]   [facility] [address]  [place], [place] [phone] or [phone]   [facility] ([facility]) [address], [place], [place] [phone] Accepts Medicaid  [facility] [address]  [place] [phone] Substance Abuse/Addiction Treatment   [facility] Organization         Address  Phone  Notes  [facility]  [phone]   Angie Fava, PhD [address] Knack [place], [place]   [phone] or [phone]   [facility]   [address] [place], [place] [phone]   [facility] [address], [place], [place] [phone] Insurance/Medicaid/sponsorship through [facility] [address]                                    [place], [place] [phone] Therapy/tele-psych/case  [facility] [address], [place] [phone]    Dr. Lolly Mustache  [phone]   [facility] [place]  [employer] [facility] Dept. 1) [address], [place] 2) [address], [place] 3)  [address], [place] [phone]  [phone]    [facility] Child Abuse Hotline [phone] or [phone] (After Hours)

## 2013-08-27 NOTE — Telephone Encounter (Signed)
Patient show up at office for samples of crestor 10 mg gave to him at front desk

## 2013-08-27 NOTE — ED Notes (Signed)
Pt reports knee pain x 2 weeks denies any injur. Pt also reports chronic chest pain more frequently over last few weeks. Pt states he is out of his medication and has had a MI in the past.

## 2013-08-27 NOTE — ED Notes (Signed)
Having pain to right knee for several months.  Denies having any chest pain at this time.  Rates knee pain 9.  Have not taken any medication today.

## 2013-08-30 ENCOUNTER — Encounter (HOSPITAL_COMMUNITY): Payer: Self-pay | Admitting: Emergency Medicine

## 2013-08-30 ENCOUNTER — Emergency Department (HOSPITAL_COMMUNITY): Payer: Self-pay

## 2013-08-30 ENCOUNTER — Emergency Department (HOSPITAL_COMMUNITY)
Admission: EM | Admit: 2013-08-30 | Discharge: 2013-08-30 | Disposition: A | Discharge status: Home / Self Care | Payer: Self-pay | Attending: Emergency Medicine | Admitting: Emergency Medicine

## 2013-08-30 DIAGNOSIS — I10 Essential (primary) hypertension: Secondary | ICD-10-CM | POA: Insufficient documentation

## 2013-08-30 DIAGNOSIS — Z79899 Other long term (current) drug therapy: Secondary | ICD-10-CM | POA: Insufficient documentation

## 2013-08-30 DIAGNOSIS — Z23 Encounter for immunization: Secondary | ICD-10-CM | POA: Insufficient documentation

## 2013-08-30 DIAGNOSIS — Z9861 Coronary angioplasty status: Secondary | ICD-10-CM | POA: Insufficient documentation

## 2013-08-30 DIAGNOSIS — Z7982 Long term (current) use of aspirin: Secondary | ICD-10-CM | POA: Insufficient documentation

## 2013-08-30 DIAGNOSIS — S0191XA Laceration without foreign body of unspecified part of head, initial encounter: Secondary | ICD-10-CM

## 2013-08-30 DIAGNOSIS — Z880 Allergy status to penicillin: Secondary | ICD-10-CM | POA: Insufficient documentation

## 2013-08-30 DIAGNOSIS — F17200 Nicotine dependence, unspecified, uncomplicated: Secondary | ICD-10-CM | POA: Insufficient documentation

## 2013-08-30 DIAGNOSIS — Z791 Long term (current) use of non-steroidal anti-inflammatories (NSAID): Secondary | ICD-10-CM | POA: Insufficient documentation

## 2013-08-30 DIAGNOSIS — I252 Old myocardial infarction: Secondary | ICD-10-CM | POA: Insufficient documentation

## 2013-08-30 DIAGNOSIS — S0990XA Unspecified injury of head, initial encounter: Secondary | ICD-10-CM | POA: Insufficient documentation

## 2013-08-30 DIAGNOSIS — S0100XA Unspecified open wound of scalp, initial encounter: Secondary | ICD-10-CM | POA: Insufficient documentation

## 2013-08-30 DIAGNOSIS — Z7902 Long term (current) use of antithrombotics/antiplatelets: Secondary | ICD-10-CM | POA: Insufficient documentation

## 2013-08-30 DIAGNOSIS — M129 Arthropathy, unspecified: Secondary | ICD-10-CM | POA: Insufficient documentation

## 2013-08-30 DIAGNOSIS — Z8719 Personal history of other diseases of the digestive system: Secondary | ICD-10-CM | POA: Insufficient documentation

## 2013-08-30 MED ORDER — TETANUS-DIPHTH-ACELL PERTUSSIS 5-2.5-18.5 LF-MCG/0.5 IM SUSP
0.5000 mL | Freq: Once | INTRAMUSCULAR | Status: AC
  Administered 2013-08-30: 0.5 mL via INTRAMUSCULAR
  Filled 2013-08-30: qty 0.5

## 2013-08-30 MED ORDER — LIDOCAINE HCL (PF) 1 % IJ SOLN
5.0000 mL | Freq: Once | INTRAMUSCULAR | Status: AC
  Administered 2013-08-30: 5 mL
  Filled 2013-08-30: qty 5

## 2013-08-30 MED ORDER — POVIDONE-IODINE 10 % EX SOLN
CUTANEOUS | Status: AC
  Filled 2013-08-30: qty 118

## 2013-08-30 NOTE — ED Notes (Signed)
Pt ambulated around the nursing station without any problems and is currently drinking a sprite.

## 2013-08-30 NOTE — Discharge Instructions (Signed)
Head Injury °Follow up in 1 week for suture removal. Return to the ED if you develop new or worsening symptoms. °You have received a head injury. It does not appear serious at this time. Headaches and vomiting are common following head injury. It should be easy to awaken from sleeping. Sometimes it is necessary for you to stay in the emergency department for a while for observation. Sometimes admission to the hospital may be needed. After injuries such as yours, most problems occur within the first 24 hours, but side effects may occur up to 7-10 days after the injury. It is important for you to carefully monitor your condition and contact your health care provider or seek immediate medical care if there is a change in your condition. °WHAT ARE THE TYPES OF HEAD INJURIES? °Head injuries can be as minor as a bump. Some head injuries can be more severe. More severe head injuries include: °· A jarring injury to the brain (concussion). °· A bruise of the brain (contusion). This mean there is bleeding in the brain that can cause swelling. °· A cracked skull (skull fracture). °· Bleeding in the brain that collects, clots, and forms a bump (hematoma). °WHAT CAUSES A HEAD INJURY? °A serious head injury is most likely to happen to someone who is in a car wreck and is not wearing a seat belt. Other causes of major head injuries include bicycle or motorcycle accidents, sports injuries, and falls. °HOW ARE HEAD INJURIES DIAGNOSED? °A complete history of the event leading to the injury and your current symptoms will be helpful in diagnosing head injuries. Many times, pictures of the brain, such as CT or MRI are needed to see the extent of the injury. Often, an overnight hospital stay is necessary for observation.  °WHEN SHOULD I SEEK IMMEDIATE MEDICAL CARE?  °You should get help right away if: °· You have confusion or drowsiness. °· You feel sick to your stomach (nauseous) or have continued, forceful vomiting. °· You have  dizziness or unsteadiness that is getting worse. °· You have severe, continued headaches not relieved by medicine. Only take over-the-counter or prescription medicines for pain, fever, or discomfort as directed by your health care provider. °· You do not have normal function of the arms or legs or are unable to walk. °· You notice changes in the black spots in the center of the colored part of your eye (pupil). °· You have a clear or bloody fluid coming from your nose or ears. °· You have a loss of vision. °During the next 24 hours after the injury, you must stay with someone who can watch you for the warning signs. This person should contact local emergency services (911 in the [place] if you have seizures, you become unconscious, or you are unable to wake up. °HOW CAN I PREVENT A HEAD INJURY IN THE FUTURE? °The most important factor for preventing major head injuries is avoiding motor vehicle accidents.  To minimize the potential for damage to your head, it is crucial to wear seat belts while riding in motor vehicles. Wearing helmets while bike riding and playing collision sports (like football) is also helpful. Also, avoiding dangerous activities around the house will further help reduce your risk of head injury.  °WHEN CAN I RETURN TO NORMAL ACTIVITIES AND ATHLETICS? °You should be reevaluated by your health care provider before returning to these activities. If you have any of the following symptoms, you should not return to activities or contact sports until 1 week after   the symptoms have stopped:  Persistent headache.  Dizziness or vertigo.  Poor attention and concentration.  Confusion.  Memory problems.  Nausea or vomiting.  Fatigue or tire easily.  Irritability.  Intolerant of bright lights or loud noises.  Anxiety or depression.  Disturbed sleep. MAKE SURE YOU:   Understand these instructions.  Will watch your condition.  Will get help right away if you are not doing well or get  worse. Document Released: 12/28/2004 Document Revised: 01/02/2013 Document Reviewed: 09/04/2012 [facility] Patient Information 2015 [place], [place]. This information is not intended to replace advice given to you by your health care provider. Make sure you discuss any questions you have with your health care provider.
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
## 2013-09-08 ENCOUNTER — Encounter (HOSPITAL_COMMUNITY): Payer: Self-pay | Admitting: Emergency Medicine

## 2013-09-08 ENCOUNTER — Emergency Department (HOSPITAL_COMMUNITY)
Admission: EM | Admit: 2013-09-08 | Discharge: 2013-09-08 | Disposition: A | Discharge status: Home / Self Care | Payer: Self-pay | Attending: Emergency Medicine | Admitting: Emergency Medicine

## 2013-09-08 DIAGNOSIS — M129 Arthropathy, unspecified: Secondary | ICD-10-CM | POA: Insufficient documentation

## 2013-09-08 DIAGNOSIS — Z79899 Other long term (current) drug therapy: Secondary | ICD-10-CM | POA: Insufficient documentation

## 2013-09-08 DIAGNOSIS — F17200 Nicotine dependence, unspecified, uncomplicated: Secondary | ICD-10-CM | POA: Insufficient documentation

## 2013-09-08 DIAGNOSIS — I10 Essential (primary) hypertension: Secondary | ICD-10-CM | POA: Insufficient documentation

## 2013-09-08 DIAGNOSIS — Z7902 Long term (current) use of antithrombotics/antiplatelets: Secondary | ICD-10-CM | POA: Insufficient documentation

## 2013-09-08 DIAGNOSIS — Z7982 Long term (current) use of aspirin: Secondary | ICD-10-CM | POA: Insufficient documentation

## 2013-09-08 DIAGNOSIS — I252 Old myocardial infarction: Secondary | ICD-10-CM | POA: Insufficient documentation

## 2013-09-08 DIAGNOSIS — Z9861 Coronary angioplasty status: Secondary | ICD-10-CM | POA: Insufficient documentation

## 2013-09-08 DIAGNOSIS — Z880 Allergy status to penicillin: Secondary | ICD-10-CM | POA: Insufficient documentation

## 2013-09-08 DIAGNOSIS — Z8719 Personal history of other diseases of the digestive system: Secondary | ICD-10-CM | POA: Insufficient documentation

## 2013-09-08 DIAGNOSIS — Z4802 Encounter for removal of sutures: Secondary | ICD-10-CM | POA: Insufficient documentation

## 2013-09-08 MED ORDER — BACITRACIN ZINC 500 UNIT/GM EX OINT
TOPICAL_OINTMENT | CUTANEOUS | Status: AC
  Filled 2013-09-08: qty 0.9

## 2013-09-08 MED ORDER — BACITRACIN-NEOMYCIN-POLYMYXIN 400-5-5000 EX OINT
TOPICAL_OINTMENT | Freq: Once | CUTANEOUS | Status: AC
  Administered 2013-09-08: 18:00:00 via TOPICAL

## 2013-09-08 NOTE — ED Notes (Signed)
Removed 4 staples from laceration w/out difficulty.  No drainage, swelling or redness noted.  Site well approximated.

## 2013-09-08 NOTE — ED Notes (Signed)
Pt reports staples placed last Saturday. Pt here for follow-up and removal of staples. nad noted. Pt denies any fevers. Site intact. No active bleeding/drainage.

## 2013-09-08 NOTE — ED Provider Notes (Signed)
CSN: [id number]     Arrival date & time 09/08/13  1738 History   First MD Initiated Contact with Patient 09/08/13 1755     Chief Complaint  Patient presents with  . Suture / Staple Removal     (Consider location/radiation/quality/duration/timing/severity/associated sxs/prior Treatment) Patient is a 53 y.o. Flowers presenting with suture removal. The history is provided by the patient.  Suture / Staple Removal This is a new problem.   Frank Flowers is a 53 y.o. Flowers who presents to the ED for staple removal. Four staples to the right side of the head. No problems. Past Medical History  Diagnosis Date  . Hypertension   . Myocardial infarction Jan 2013  . Arthritis   . Blood in stool   . Rectal bleeding   . Vertigo   . Chest pain    Past Surgical History  Procedure Laterality Date  . Coronary angioplasty with stent placement    . Colonoscopy N/A 06/08/2012    Procedure: COLONOSCOPY;  Surgeon: Barrie Folk, MD;  Location: [facility] ENDOSCOPY;  Service: Endoscopy;  Laterality: N/A;   History reviewed. No pertinent family history. History  Substance Use Topics  . Smoking status: Current Every Day Smoker -- 1.00 packs/day    Types: Cigarettes  . Smokeless tobacco: Not on file  . Alcohol Use: 0.0 oz/week    7-14 Cans of beer per week     Comment: every other day    Review of Systems Negative except as stated in HPI   Allergies  Penicillins  Home Medications   Prior to Admission medications   Medication Sig Start Date End Date Taking? Authorizing Provider  acetaminophen (TYLENOL) 500 MG tablet Take 500 mg by mouth every 6 (six) hours as needed for mild pain or moderate pain.    Historical Provider, MD  aspirin EC 81 MG tablet Take 81 mg by mouth daily.    Historical Provider, MD  clopidogrel (PLAVIX) 75 MG tablet Take 75 mg by mouth daily.    Historical Provider, MD  HYDROcodone-acetaminophen (NORCO/VICODIN) 5-325 MG per tablet Take 1-2 tablets by mouth every 4 (four) hours as  needed. 08/27/13   Kristen N Ward, DO  ibuprofen (ADVIL,MOTRIN) 800 MG tablet Take 1 tablet (800 mg total) by mouth every 8 (eight) hours as needed for mild pain. 08/27/13   Kristen N Ward, DO  lisinopril (PRINIVIL,ZESTRIL) 10 MG tablet Take 10 mg by mouth daily. 02/01/11 08/30/13  Corky Crafts, MD  metoprolol tartrate (LOPRESSOR) 25 MG tablet Take 1 tablet (25 mg total) by mouth 2 (two) times daily. 02/01/11 08/30/13  Corky Crafts, MD  rosuvastatin (CRESTOR) 10 MG tablet Take 10 mg by mouth daily.    Historical Provider, MD   BP 140/97  Pulse 76  Temp(Src) 98.9 F (37.2 C) (Oral)  Resp 16  Ht  (1.88 m)  Wt 270 lb (122.471 kg)  BMI 34.65 kg/m2  SpO2 98% Physical Exam  Nursing note and vitals reviewed. Constitutional: He is oriented to person, place, and time. He appears well-developed and well-nourished.  HENT:  Head:    Healing wound with staples in place. No drainage, erythema or other signs of infection.   Eyes: EOM are normal.  Neck: Neck supple.  Cardiovascular: Normal rate.   Pulmonary/Chest: Effort normal.  Musculoskeletal: Normal range of motion.  Neurological: He is alert and oriented to person, place, and time.  Skin: Skin is warm and dry.  Psychiatric: He has a normal mood and affect.  His behavior is normal.    ED Course  Procedures (including critical care time) Wound cleaned.  Staples removed without difficulty by RN. Bacitracin ointment applied.  MDM  53 y.o. Flowers here for staple removal. Stable for discharge without signs of infection or any problems.     [facility] Orlene Och, [place] 09/09/13 [phone]

## 2013-09-09 NOTE — ED Provider Notes (Signed)
Medical screening examination/treatment/procedure(s) were performed by non-physician practitioner and as supervising physician I was immediately available for consultation/collaboration.   EKG Interpretation None        Hiran Leard L Liesa Tsan, MD 09/09/13 1739 

## 2013-10-09 ENCOUNTER — Ambulatory Visit: Payer: Self-pay | Admitting: Interventional Cardiology

## 2013-10-24 ENCOUNTER — Emergency Department (HOSPITAL_COMMUNITY)
Admission: EM | Admit: 2013-10-24 | Discharge: 2013-10-24 | Disposition: A | Discharge status: Home / Self Care | Payer: Self-pay | Attending: Emergency Medicine | Admitting: Emergency Medicine

## 2013-10-24 ENCOUNTER — Emergency Department (HOSPITAL_COMMUNITY): Payer: Self-pay

## 2013-10-24 ENCOUNTER — Encounter (HOSPITAL_COMMUNITY): Payer: Self-pay | Admitting: Emergency Medicine

## 2013-10-24 DIAGNOSIS — Z720 Tobacco use: Secondary | ICD-10-CM | POA: Insufficient documentation

## 2013-10-24 DIAGNOSIS — R079 Chest pain, unspecified: Secondary | ICD-10-CM | POA: Insufficient documentation

## 2013-10-24 DIAGNOSIS — I252 Old myocardial infarction: Secondary | ICD-10-CM | POA: Insufficient documentation

## 2013-10-24 DIAGNOSIS — I10 Essential (primary) hypertension: Secondary | ICD-10-CM | POA: Insufficient documentation

## 2013-10-24 DIAGNOSIS — Z7902 Long term (current) use of antithrombotics/antiplatelets: Secondary | ICD-10-CM | POA: Insufficient documentation

## 2013-10-24 DIAGNOSIS — Z7982 Long term (current) use of aspirin: Secondary | ICD-10-CM | POA: Insufficient documentation

## 2013-10-24 DIAGNOSIS — M1990 Unspecified osteoarthritis, unspecified site: Secondary | ICD-10-CM | POA: Insufficient documentation

## 2013-10-24 DIAGNOSIS — Z9861 Coronary angioplasty status: Secondary | ICD-10-CM | POA: Insufficient documentation

## 2013-10-24 LAB — CBC
HCT: 52.2 % — ABNORMAL HIGH (ref 39.0–52.0)
Hemoglobin: 18.6 g/dL — ABNORMAL HIGH (ref 13.0–17.0)
MCH: 32.7 pg (ref 26.0–34.0)
MCHC: 35.6 g/dL (ref 30.0–36.0)
MCV: 91.7 fL (ref 78.0–100.0)
Platelets: 203 10*3/uL (ref 150–400)
RBC: 5.69 MIL/uL (ref 4.22–5.81)
RDW: 14.4 % (ref 11.5–15.5)
WBC: 8.3 10*3/uL (ref 4.0–10.5)

## 2013-10-24 LAB — BASIC METABOLIC PANEL
Anion gap: 14 (ref 5–15)
BUN: 17 mg/dL (ref 6–23)
CO2: 24 mEq/L (ref 19–32)
Calcium: 9.3 mg/dL (ref 8.4–10.5)
Chloride: 99 mEq/L (ref 96–112)
Creatinine, Ser: 0.94 mg/dL (ref 0.50–1.35)
GFR calc Af Amer: >90 mL/min (ref >90)
GFR calc non Af Amer: >90 mL/min (ref >90)
Glucose, Bld: 97 mg/dL (ref 70–99)
Potassium: 3.8 mEq/L (ref 3.7–5.3)
Sodium: 137 mEq/L (ref 137–147)

## 2013-10-24 LAB — TROPONIN I
Troponin I: <0.3 ng/mL (ref <0.30)
Troponin I: <0.3 ng/mL (ref <0.30)

## 2013-10-24 MED ORDER — ASPIRIN 81 MG PO CHEW
324.0000 mg | CHEWABLE_TABLET | Freq: Once | ORAL | Status: AC
  Administered 2013-10-24: 324 mg via ORAL
  Filled 2013-10-24: qty 4

## 2013-10-24 MED ORDER — NITROGLYCERIN 0.4 MG SL SUBL
0.4000 mg | SUBLINGUAL_TABLET | SUBLINGUAL | Status: DC | PRN
  Administered 2013-10-24 (×2): 0.4 mg via SUBLINGUAL
  Filled 2013-10-24: qty 1

## 2013-10-24 MED ORDER — CLOPIDOGREL BISULFATE 75 MG PO TABS: 75.0000 mg | ORAL_TABLET | Freq: Every day | ORAL | Status: DC

## 2013-10-24 MED ORDER — METOPROLOL SUCCINATE ER 25 MG PO TB24: 25.0000 mg | ORAL_TABLET | Freq: Every day | ORAL | Status: DC

## 2013-10-24 NOTE — ED Provider Notes (Signed)
CSN: [id number]     Arrival date & time 10/24/13  0221 History   First MD Initiated Contact with Patient 10/24/13 0234     Chief Complaint  Patient presents with  . Chest Pain     (Consider location/radiation/quality/duration/timing/severity/associated sxs/prior Treatment) HPI Comments: Patient is a 53 year old male with history of coronary artery disease. He has had an MI in the past and has a stent. This was placed in 2013. He presents today with complaints of sharp pains in the front of his chest that started while driving home after a card game this evening. He states he was arguing with his significant other when his symptoms began. He denies to me he is short of breath, nauseated, diaphoretic. He has not had any problems since his stent was placed, however he has been off of his Plavix and metoprolol for the past 2 months due to financial reasons.  Patient is a 53 y.o. male presenting with chest pain. The history is provided by the patient.  Chest Pain Pain location:  Substernal area Pain quality: sharp   Pain radiates to:  L shoulder Pain radiates to the back: no   Pain severity:  Moderate Onset quality:  Sudden Duration:  1 hour Timing:  Constant Progression:  Partially resolved Chronicity:  New Relieved by:  Nothing Worsened by:  Nothing tried Ineffective treatments:  None tried   Past Medical History  Diagnosis Date  . Hypertension   . Myocardial infarction Jan 2013  . Arthritis   . Blood in stool   . Rectal bleeding   . Vertigo   . Chest pain    Past Surgical History  Procedure Laterality Date  . Coronary angioplasty with stent placement    . Colonoscopy N/A 06/08/2012    Procedure: COLONOSCOPY;  Surgeon: Barrie FolkJohn C Hayes, MD;  [facility];  Service: Endoscopy;  Laterality: N/A;   No family history on file. History  Substance Use Topics  . Smoking status: Current Every Day Smoker -- 1.00 packs/day    Types: Cigarettes  . Smokeless tobacco: Not on  file  . Alcohol Use: 0.0 oz/week    7-14 Cans of beer per week     Comment: every other day    Review of Systems  Cardiovascular: Positive for chest pain.  All other systems reviewed and are negative.     Allergies  Penicillins  Home Medications   Prior to Admission medications   Medication Sig Start Date End Date Taking? Authorizing Provider  aspirin EC 81 MG tablet Take 81 mg by mouth daily.   Yes Historical Provider, MD  clopidogrel (PLAVIX) 75 MG tablet Take 75 mg by mouth daily.   Yes Historical Provider, MD  lisinopril (PRINIVIL,ZESTRIL) 10 MG tablet Take 10 mg by mouth daily. 02/01/11 10/24/13 Yes Corky CraftsJayadeep S Varanasi, MD  rosuvastatin (CRESTOR) 10 MG tablet Take 10 mg by mouth daily.   Yes Historical Provider, MD  acetaminophen (TYLENOL) 500 MG tablet Take 500 mg by mouth every 6 (six) hours as needed for mild pain or moderate pain.    Historical Provider, MD  HYDROcodone-acetaminophen (NORCO/VICODIN) 5-325 MG per tablet Take 1-2 tablets by mouth every 4 (four) hours as needed. 08/27/13   Kristen N Ward, DO  ibuprofen (ADVIL,MOTRIN) 800 MG tablet Take 1 tablet (800 mg total) by mouth every 8 (eight) hours as needed for mild pain. 08/27/13   Kristen N Ward, DO  metoprolol tartrate (LOPRESSOR) 25 MG tablet Take 1 tablet (25 mg total) by mouth  2 (two) times daily. 02/01/11 08/30/13  Corky CraftsJayadeep S Varanasi, MD   BP 127/101  Pulse 88  Temp(Src) 98.1 F (36.7 C) (Oral)  Resp 12  Ht 6\' 2"  (1.88 m)  Wt 260 lb (117.935 kg)  BMI 33.37 kg/m2  SpO2 96% Physical Exam  Nursing note and vitals reviewed. Constitutional: He is oriented to person, place, and time. He appears well-developed and well-nourished. No distress.  HENT:  Head: Normocephalic and atraumatic.  Mouth/Throat: Oropharynx is clear and moist.  Neck: Normal range of motion. Neck supple.  Cardiovascular: Normal rate, regular rhythm and normal heart sounds.   No murmur heard. Pulmonary/Chest: Effort normal and breath sounds  normal. No respiratory distress. He has no wheezes. He has no rales.  Abdominal: Soft. Bowel sounds are normal. He exhibits no distension. There is no tenderness.  Musculoskeletal: Normal range of motion. He exhibits no edema.  Lymphadenopathy:    He has no cervical adenopathy.  Neurological: He is alert and oriented to person, place, and time.  Skin: Skin is warm and dry. He is not diaphoretic.    ED Course  Procedures (including critical care time) Labs Review Labs Reviewed  CBC - Abnormal; Notable for the following:    Hemoglobin 18.6 (*)    HCT 52.2 (*)    All other components within normal limits  BASIC METABOLIC PANEL  TROPONIN I    Imaging Review No results found.   Date: 10/24/2013  Rate: 83  Rhythm: normal sinus rhythm  QRS Axis: normal  Intervals: normal  ST/T Wave abnormalities: nonspecific T wave changes  Conduction Disutrbances:none  Narrative Interpretation:   Old EKG Reviewed: unchanged    MDM   Final diagnoses:  None    Patient with history of coronary artery disease with stent.  He presents with complaints of sharp pain in the chest that started just prior to coming here while driving.  His pain nearly resolved upon arriving here.  He was given aspirin in the ED.  Initial troponin was negative and ekg was unchanged.  I had a discussion with him regarding the disposition and he has decided that he would prefer to go home and follow up as an outpatient.  I will refill his plavix and metoprolol as he is out of these.    Geoffery Lyonsouglas Damonie Ellenwood, MD 10/24/13 [phone]

## 2013-10-24 NOTE — ED Notes (Signed)
Pt. Reports left chest pain starting at 0210. Pt. Reports aching pain, with dizziness and shortness of breath. Pt. Denies nausea.

## 2013-10-24 NOTE — Discharge Instructions (Signed)

## 2013-12-04 ENCOUNTER — Telehealth: Payer: Self-pay

## 2013-12-05 ENCOUNTER — Other Ambulatory Visit: Payer: Self-pay

## 2013-12-05 NOTE — Telephone Encounter (Signed)
Yes. He needs an office visit

## 2013-12-05 NOTE — Telephone Encounter (Signed)
lisinopril (PRINIVIL,ZESTRIL) 10 MG tablet(Expired metoprolol tartrate (LOPRESSOR) 25 MG tablet(Expired) clopidogrel (PLAVIX) 75 MG tablet  Pharmacy ([facility]) called requesting refills for the listed medications above. I went and spoke with Dr. Eldridge DaceVaranasi to get approval to refill since pt needs appt. Dr Eldridge DaceVaranasi stated ok to 30 tablets no refills on all three. I called into pharmacy and stated to let pt know he needs a follow up appt to receive further refills and our phone number is [phone]

## 2013-12-11 ENCOUNTER — Other Ambulatory Visit: Payer: Self-pay

## 2013-12-11 MED ORDER — CLOPIDOGREL BISULFATE 75 MG PO TABS: 75.0000 mg | ORAL_TABLET | Freq: Every day | ORAL | Status: DC

## 2013-12-12 ENCOUNTER — Other Ambulatory Visit: Payer: Self-pay

## 2013-12-12 MED ORDER — LISINOPRIL 10 MG PO TABS: 10.0000 mg | ORAL_TABLET | Freq: Every day | ORAL | Status: DC

## 2013-12-12 MED ORDER — METOPROLOL TARTRATE 25 MG PO TABS: 25.0000 mg | ORAL_TABLET | Freq: Two times a day (BID) | ORAL | Status: DC

## 2013-12-12 MED ORDER — CLOPIDOGREL BISULFATE 75 MG PO TABS: 75.0000 mg | ORAL_TABLET | Freq: Every day | ORAL | Status: DC

## 2013-12-20 ENCOUNTER — Encounter (HOSPITAL_COMMUNITY): Payer: Self-pay | Admitting: Interventional Cardiology

## 2018-07-20 DIAGNOSIS — F17200 Nicotine dependence, unspecified, uncomplicated: Secondary | ICD-10-CM | POA: Insufficient documentation

## 2019-01-15 ENCOUNTER — Encounter (HOSPITAL_COMMUNITY): Payer: Self-pay

## 2019-01-15 ENCOUNTER — Other Ambulatory Visit: Payer: Self-pay

## 2019-01-15 ENCOUNTER — Emergency Department (HOSPITAL_COMMUNITY)
Admission: EM | Admit: 2019-01-15 | Discharge: 2019-01-16 | Disposition: A | Discharge status: Home / Self Care | Payer: Medicaid Other | Attending: Emergency Medicine | Admitting: Emergency Medicine

## 2019-01-15 DIAGNOSIS — Z5321 Procedure and treatment not carried out due to patient leaving prior to being seen by health care provider: Secondary | ICD-10-CM | POA: Insufficient documentation

## 2019-01-15 DIAGNOSIS — R531 Weakness: Secondary | ICD-10-CM | POA: Diagnosis present

## 2019-01-15 LAB — BASIC METABOLIC PANEL
Anion gap: 12 (ref 5–15)
BUN: 16 mg/dL (ref 6–20)
CO2: 23 mmol/L (ref 22–32)
Calcium: 9.1 mg/dL (ref 8.9–10.3)
Chloride: 99 mmol/L (ref 98–111)
Creatinine, Ser: 1.21 mg/dL (ref 0.61–1.24)
GFR calc Af Amer: >60 mL/min (ref >60)
GFR calc non Af Amer: >60 mL/min (ref >60)
Glucose, Bld: 101 mg/dL — ABNORMAL HIGH (ref 70–99)
Potassium: 4.4 mmol/L (ref 3.5–5.1)
Sodium: 134 mmol/L — ABNORMAL LOW (ref 135–145)

## 2019-01-15 LAB — CBC
HCT: 61 % — ABNORMAL HIGH (ref 39.0–52.0)
Hemoglobin: 20.4 g/dL — ABNORMAL HIGH (ref 13.0–17.0)
MCH: 29.7 pg (ref 26.0–34.0)
MCHC: 33.4 g/dL (ref 30.0–36.0)
MCV: 88.8 fL (ref 80.0–100.0)
Platelets: 153 10*3/uL (ref 150–400)
RBC: 6.87 MIL/uL — ABNORMAL HIGH (ref 4.22–5.81)
RDW: 16.1 % — ABNORMAL HIGH (ref 11.5–15.5)
WBC: 5.3 10*3/uL (ref 4.0–10.5)
nRBC: 0 % (ref 0.0–0.2)

## 2019-01-15 NOTE — ED Notes (Signed)
This NT loudly called pt name 3x in waiting room for vital sign collection to no response.

## 2019-01-15 NOTE — ED Triage Notes (Signed)
Patient complains of general weakness and fatigue-reports no appetite x 4 days. Denies pain, no fever.

## 2019-03-27 ENCOUNTER — Inpatient Hospital Stay (HOSPITAL_COMMUNITY)
Admission: EM | Admit: 2019-03-27 | Discharge: 2019-03-30 | DRG: 291 | Disposition: A | Discharge status: Home / Self Care | Payer: Medicaid Other | Attending: Internal Medicine | Admitting: Internal Medicine

## 2019-03-27 ENCOUNTER — Emergency Department (HOSPITAL_COMMUNITY): Payer: Medicaid Other

## 2019-03-27 ENCOUNTER — Encounter (HOSPITAL_COMMUNITY): Payer: Self-pay | Admitting: Emergency Medicine

## 2019-03-27 ENCOUNTER — Other Ambulatory Visit: Payer: Self-pay

## 2019-03-27 DIAGNOSIS — J9601 Acute respiratory failure with hypoxia: Secondary | ICD-10-CM | POA: Diagnosis present

## 2019-03-27 DIAGNOSIS — I509 Heart failure, unspecified: Secondary | ICD-10-CM

## 2019-03-27 DIAGNOSIS — R0602 Shortness of breath: Secondary | ICD-10-CM | POA: Diagnosis present

## 2019-03-27 DIAGNOSIS — I252 Old myocardial infarction: Secondary | ICD-10-CM

## 2019-03-27 DIAGNOSIS — Z79899 Other long term (current) drug therapy: Secondary | ICD-10-CM

## 2019-03-27 DIAGNOSIS — Z955 Presence of coronary angioplasty implant and graft: Secondary | ICD-10-CM

## 2019-03-27 DIAGNOSIS — K08409 Partial loss of teeth, unspecified cause, unspecified class: Secondary | ICD-10-CM | POA: Diagnosis present

## 2019-03-27 DIAGNOSIS — Z880 Allergy status to penicillin: Secondary | ICD-10-CM

## 2019-03-27 DIAGNOSIS — I255 Ischemic cardiomyopathy: Secondary | ICD-10-CM | POA: Diagnosis present

## 2019-03-27 DIAGNOSIS — Z8616 Personal history of COVID-19: Secondary | ICD-10-CM

## 2019-03-27 DIAGNOSIS — I5021 Acute systolic (congestive) heart failure: Secondary | ICD-10-CM

## 2019-03-27 DIAGNOSIS — Z8249 Family history of ischemic heart disease and other diseases of the circulatory system: Secondary | ICD-10-CM

## 2019-03-27 DIAGNOSIS — Z7902 Long term (current) use of antithrombotics/antiplatelets: Secondary | ICD-10-CM

## 2019-03-27 DIAGNOSIS — D751 Secondary polycythemia: Secondary | ICD-10-CM | POA: Diagnosis present

## 2019-03-27 DIAGNOSIS — Z720 Tobacco use: Secondary | ICD-10-CM

## 2019-03-27 DIAGNOSIS — Z791 Long term (current) use of non-steroidal anti-inflammatories (NSAID): Secondary | ICD-10-CM

## 2019-03-27 DIAGNOSIS — E785 Hyperlipidemia, unspecified: Secondary | ICD-10-CM | POA: Diagnosis present

## 2019-03-27 DIAGNOSIS — I5023 Acute on chronic systolic (congestive) heart failure: Secondary | ICD-10-CM | POA: Diagnosis present

## 2019-03-27 DIAGNOSIS — Z9114 Patient's other noncompliance with medication regimen: Secondary | ICD-10-CM

## 2019-03-27 DIAGNOSIS — Z7982 Long term (current) use of aspirin: Secondary | ICD-10-CM

## 2019-03-27 DIAGNOSIS — F17210 Nicotine dependence, cigarettes, uncomplicated: Secondary | ICD-10-CM | POA: Diagnosis present

## 2019-03-27 DIAGNOSIS — I4581 Long QT syndrome: Secondary | ICD-10-CM | POA: Diagnosis present

## 2019-03-27 DIAGNOSIS — I2510 Atherosclerotic heart disease of native coronary artery without angina pectoris: Secondary | ICD-10-CM | POA: Diagnosis present

## 2019-03-27 DIAGNOSIS — J441 Chronic obstructive pulmonary disease with (acute) exacerbation: Secondary | ICD-10-CM

## 2019-03-27 DIAGNOSIS — I110 Hypertensive heart disease with heart failure: Principal | ICD-10-CM | POA: Diagnosis present

## 2019-03-27 HISTORY — DX: Accidental discharge from unspecified firearms or gun, initial encounter: W34.00XA

## 2019-03-27 HISTORY — DX: Unspecified firearm discharge, undetermined intent, initial encounter: Y24.9XXA

## 2019-03-27 LAB — COMPREHENSIVE METABOLIC PANEL
ALT: 21 U/L (ref 0–44)
AST: 28 U/L (ref 15–41)
Albumin: 3.8 g/dL (ref 3.5–5.0)
Alkaline Phosphatase: 55 U/L (ref 38–126)
Anion gap: 8 (ref 5–15)
BUN: 12 mg/dL (ref 6–20)
CO2: 26 mmol/L (ref 22–32)
Calcium: 9.1 mg/dL (ref 8.9–10.3)
Chloride: 105 mmol/L (ref 98–111)
Creatinine, Ser: 1.11 mg/dL (ref 0.61–1.24)
GFR calc Af Amer: >60 mL/min (ref >60)
GFR calc non Af Amer: >60 mL/min (ref >60)
Glucose, Bld: 87 mg/dL (ref 70–99)
Potassium: 3.8 mmol/L (ref 3.5–5.1)
Sodium: 139 mmol/L (ref 135–145)
Total Bilirubin: 1.1 mg/dL (ref 0.3–1.2)
Total Protein: 8 g/dL (ref 6.5–8.1)

## 2019-03-27 LAB — CBC WITH DIFFERENTIAL/PLATELET
Abs Immature Granulocytes: 0.02 10*3/uL (ref 0.00–0.07)
Basophils Absolute: 0.1 10*3/uL (ref 0.0–0.1)
Basophils Relative: 1 %
Eosinophils Absolute: 0.2 10*3/uL (ref 0.0–0.5)
Eosinophils Relative: 3 %
HCT: 54.3 % — ABNORMAL HIGH (ref 39.0–52.0)
Hemoglobin: 17.4 g/dL — ABNORMAL HIGH (ref 13.0–17.0)
Immature Granulocytes: 0 %
Lymphocytes Relative: 41 %
Lymphs Abs: 2.5 10*3/uL (ref 0.7–4.0)
MCH: 29.9 pg (ref 26.0–34.0)
MCHC: 32 g/dL (ref 30.0–36.0)
MCV: 93.3 fL (ref 80.0–100.0)
Monocytes Absolute: 0.6 10*3/uL (ref 0.1–1.0)
Monocytes Relative: 9 %
Neutro Abs: 2.8 10*3/uL (ref 1.7–7.7)
Neutrophils Relative %: 46 %
Platelets: 225 10*3/uL (ref 150–400)
RBC: 5.82 MIL/uL — ABNORMAL HIGH (ref 4.22–5.81)
RDW: 17.3 % — ABNORMAL HIGH (ref 11.5–15.5)
WBC: 6.1 10*3/uL (ref 4.0–10.5)
nRBC: 0 % (ref 0.0–0.2)

## 2019-03-27 LAB — URINALYSIS, ROUTINE W REFLEX MICROSCOPIC
Bilirubin Urine: NEGATIVE
Glucose, UA: NEGATIVE mg/dL
Hgb urine dipstick: NEGATIVE
Ketones, ur: NEGATIVE mg/dL
Leukocytes,Ua: NEGATIVE
Nitrite: NEGATIVE
Protein, ur: NEGATIVE mg/dL
Specific Gravity, Urine: 1.034 — ABNORMAL HIGH (ref 1.005–1.030)
pH: 5 (ref 5.0–8.0)

## 2019-03-27 LAB — TROPONIN I (HIGH SENSITIVITY)
Troponin I (High Sensitivity): 20 ng/L — ABNORMAL HIGH (ref <18)
Troponin I (High Sensitivity): 16 ng/L (ref <18)

## 2019-03-27 MED ORDER — ALBUTEROL SULFATE HFA 108 (90 BASE) MCG/ACT IN AERS
2.0000 | INHALATION_SPRAY | Freq: Once | RESPIRATORY_TRACT | Status: AC
  Administered 2019-03-27: 2 via RESPIRATORY_TRACT
  Filled 2019-03-27: qty 6.7

## 2019-03-27 MED ORDER — METHYLPREDNISOLONE SODIUM SUCC 125 MG IJ SOLR
125.0000 mg | Freq: Once | INTRAMUSCULAR | Status: AC
  Administered 2019-03-27: 125 mg via INTRAVENOUS
  Filled 2019-03-27: qty 2

## 2019-03-27 MED ORDER — FUROSEMIDE 10 MG/ML IJ SOLN
40.0000 mg | Freq: Once | INTRAMUSCULAR | Status: AC
  Administered 2019-03-27: 40 mg via INTRAVENOUS
  Filled 2019-03-27: qty 4

## 2019-03-27 MED ORDER — IOHEXOL 350 MG/ML SOLN
100.0000 mL | Freq: Once | INTRAVENOUS | Status: AC | PRN
  Administered 2019-03-27: 100 mL via INTRAVENOUS

## 2019-03-27 MED ORDER — AEROCHAMBER PLUS FLO-VU MEDIUM MISC
1.0000 | Freq: Once | Status: AC
  Administered 2019-03-27: 1
  Filled 2019-03-27: qty 1

## 2019-03-27 MED ORDER — SODIUM CHLORIDE (PF) 0.9 % IJ SOLN
INTRAMUSCULAR | Status: AC
  Filled 2019-03-27: qty 50

## 2019-03-27 NOTE — ED Triage Notes (Signed)
Pt c/o SOB since yesterday. Reports a cough with phlegm. Had Covid back in Jan.

## 2019-03-27 NOTE — ED Notes (Signed)
Pt ambulated in room while monitoring pulse oxygen. Pt maintained 95% oxygen level, however pt was breathing at 40 bpm, and had increased expiratory wheezing.

## 2019-03-27 NOTE — ED Provider Notes (Signed)
[facility]   CSN: [id number] Arrival date & time: 03/27/19  1454     History Chief Complaint  Patient presents with  . Shortness of Breath  . Cough    Frank Flowers is a 58 y.o. male.  Pt presents to the ED today with SOB.  Pt has been sob for the past few days.  Pt has had a productive cough.  He did have Covid in January.  No f/c.  No CP.  He's been using his inhaler without improvement.  Pt has continued to smoke.        Past Medical History:  Diagnosis Date  . Arthritis   . Blood in stool   . Chest pain   . Hypertension   . Myocardial infarction [facility]) Jan 2013  . Rectal bleeding   . Vertigo     Patient Active Problem List   Diagnosis Date Noted  . Acute MI, inferior wall, initial episode of care (HCC) 01/31/2011  . Morbid obesity (HCC)   . CAD (coronary artery disease) 01/29/2011    Past Surgical History:  Procedure Laterality Date  . COLONOSCOPY N/A 06/08/2012   Procedure: COLONOSCOPY;  Surgeon: Barrie Folk, MD;  [facility] ENDOSCOPY;  Service: Endoscopy;  Laterality: N/A;  . CORONARY ANGIOPLASTY WITH STENT PLACEMENT    . LEFT HEART CATHETERIZATION WITH CORONARY ANGIOGRAM N/A 01/30/2011   Procedure: LEFT HEART CATHETERIZATION WITH CORONARY ANGIOGRAM;  Surgeon: Corky Crafts, MD;  [facility];  Service: Cardiovascular;  Laterality: N/A;       No family history on file.  Social History   Tobacco Use  . Smoking status: Current Every Day Smoker    Packs/day: 1.00    Types: Cigarettes  . Smokeless tobacco: Never Used  Substance Use Topics  . Alcohol use: Yes    Alcohol/week: 7.0 - 14.0 standard drinks    Types: 7 - 14 Cans of beer per week    Comment: every other day  . Drug use: Yes    Types: Marijuana    Comment: last use 10/20/13    Home Medications Prior to Admission medications   Medication Sig Start Date End Date Taking? Authorizing Provider  albuterol (VENTOLIN HFA) 108  (90 Base) MCG/ACT inhaler Inhale 2 puffs into the lungs every 6 (six) hours as needed for shortness of breath. 02/23/19  Yes [provider]  aspirin EC 81 MG tablet Take 81 mg by mouth daily.   Yes [provider]  clopidogrel (PLAVIX) 75 MG tablet Take 1 tablet (75 mg total) by mouth daily. 12/12/13  Yes Corky Crafts, MD  gabapentin (NEURONTIN) 300 MG capsule Take 300 mg by mouth 3 (three) times daily. 02/23/19  Yes [provider]  meloxicam (MOBIC) 7.5 MG tablet Take 7.5 mg by mouth 2 (two) times daily. 02/23/19  Yes [provider]  metoprolol succinate (TOPROL XL) 25 MG 24 hr tablet Take 1 tablet (25 mg total) by mouth daily. 10/24/13  Yes Delo, Riley Lam, MD  sertraline (ZOLOFT) 50 MG tablet Take 50 mg by mouth daily. 02/23/19  Yes [provider]  HYDROcodone-acetaminophen (NORCO/VICODIN) 5-325 MG per tablet Take 1-2 tablets by mouth every 4 (four) hours as needed. Patient not taking: Reported on 03/27/2019 08/27/13   Ward, Layla Maw, DO  ibuprofen (ADVIL,MOTRIN) 800 MG tablet Take 1 tablet (800 mg total) by mouth every 8 (eight) hours as needed for mild pain. Patient not taking: Reported on 03/27/2019 08/27/13  Ward, Layla Maw, DO  lisinopril (PRINIVIL,ZESTRIL) 10 MG tablet Take 1 tablet (10 mg total) by mouth daily. 12/12/13 09/03/16  Corky Crafts, MD  metoprolol tartrate (LOPRESSOR) 25 MG tablet Take 1 tablet (25 mg total) by mouth 2 (two) times daily. 12/12/13 07/10/16  Corky Crafts, MD    Allergies    Penicillins  Review of Systems   Review of Systems  Respiratory: Positive for cough, shortness of breath and wheezing.   All other systems reviewed and are negative.   Physical Exam Updated Vital Signs BP (!) 119/96   Pulse 72   Temp 98.2 F (36.8 C) (Oral)   Resp 13   SpO2 97%   Physical Exam Vitals and nursing note reviewed.  Constitutional:      Appearance: He is well-developed.  HENT:     Head: Normocephalic  and atraumatic.     Mouth/Throat:     Mouth: Mucous membranes are moist.     Pharynx: Oropharynx is clear.  Eyes:     Extraocular Movements: Extraocular movements intact.     Pupils: Pupils are equal, round, and reactive to light.  Cardiovascular:     Rate and Rhythm: Normal rate and regular rhythm.  Pulmonary:     Breath sounds: Wheezing present.  Abdominal:     General: Bowel sounds are normal.     Palpations: Abdomen is soft.  Musculoskeletal:        General: Normal range of motion.     Cervical back: Normal range of motion and neck supple.  Skin:    General: Skin is warm.     Capillary Refill: Capillary refill takes less than 2 seconds.  Neurological:     General: No focal deficit present.     Mental Status: He is alert and oriented to person, place, and time.  Psychiatric:        Mood and Affect: Mood normal.        Behavior: Behavior normal.     ED Results / Procedures / Treatments   Labs (all labs ordered are listed, but only abnormal results are displayed) Labs Reviewed  CBC WITH DIFFERENTIAL/PLATELET - Abnormal; Notable for the following components:      Result Value   RBC 5.82 (*)    Hemoglobin 17.4 (*)    HCT 54.3 (*)    RDW 17.3 (*)    All other components within normal limits  URINALYSIS, ROUTINE W REFLEX MICROSCOPIC - Abnormal; Notable for the following components:   Specific Gravity, Urine 1.034 (*)    All other components within normal limits  TROPONIN I (HIGH SENSITIVITY) - Abnormal; Notable for the following components:   Troponin I (High Sensitivity) 20 (*)    All other components within normal limits  COMPREHENSIVE METABOLIC PANEL  TROPONIN I (HIGH SENSITIVITY)    EKG EKG Interpretation  Date/Time:  Tuesday March 27 2019 15:08:19 EDT Ventricular Rate:  85 PR Interval:    QRS Duration: 102 QT Interval:  394 QTC Calculation: 469 R Axis:   29 Text Interpretation: Sinus rhythm Probable left atrial enlargement Abnormal R-wave progression,  early transition LVH with secondary repolarization abnormality 12 Lead; Mason-Likar new t wave inversions Confirmed by Jacalyn Lefevre [phone]) on 03/27/2019 6:49:56 PM   Radiology DG Chest 2 View  Result Date: 03/27/2019 CLINICAL DATA:  Shortness of breath EXAM: CHEST - 2 VIEW COMPARISON:  10/24/2013 FINDINGS: Mild cardiomegaly. No consolidation or pleural effusion. Streaky basilar opacities. No pneumothorax. IMPRESSION: 1. Cardiomegaly without overt edema. 2.  Streaky basilar opacities may reflect atelectasis or scarring. Electronically Signed   By: Jasmine Pang M.D.   On: 03/27/2019 15:59   CT Angio Chest PE W and/or Wo Contrast  Result Date: 03/27/2019 CLINICAL DATA:  Cough, short of breath EXAM: CT ANGIOGRAPHY CHEST WITH CONTRAST TECHNIQUE: Multidetector CT imaging of the chest was performed using the standard protocol during bolus administration of intravenous contrast. Multiplanar CT image reconstructions and MIPs were obtained to evaluate the vascular anatomy. CONTRAST:  OMNIPAQUE IOHEXOL 350 MG/ML SOLN COMPARISON:  Chest x-ray 03/27/2019 FINDINGS: Cardiovascular: Satisfactory opacification of the pulmonary arteries to the segmental level. No evidence of pulmonary embolism. Nonaneurysmal aorta. Mild aortic atherosclerosis. Pulmonary trunk is slightly enlarged at 3.7 cm. Cardiomegaly. Reflux of contrast into the hepatic veins consistent with elevated right heart pressure. No pericardial effusion Mediastinum/Nodes: Subcentimeter pre-vascular and AP window nodes. Midline trachea. No thyroid mass. Esophagus within normal limits Lungs/Pleura: Small bilateral pleural effusions. Passive atelectasis at the bases. Diffuse hazy opacification of the lungs with mild septal thickening. Upper Abdomen: No acute abnormality. Musculoskeletal: No chest wall abnormality. No acute or significant osseous findings. Review of the MIP images confirms the above findings. IMPRESSION: 1. Negative for acute pulmonary  embolus. 2. Cardiomegaly with small bilateral pleural effusions. Diffuse hazy pulmonary opacity with mild septal thickening, suspect edema. No consolidative pneumonia 3. Enlarged pulmonary trunk as may be seen with arterial hypertension. Reflux of contrast into the hepatic veins suggesting elevated right heart pressure Aortic Atherosclerosis (ICD10-I70.0). Electronically Signed   By: Jasmine Pang M.D.   On: 03/27/2019 21:19    Procedures Procedures (including critical care time)  Medications Ordered in ED Medications  sodium chloride (PF) 0.9 % injection (has no administration in time range)  furosemide (LASIX) injection 40 mg (has no administration in time range)  methylPREDNISolone sodium succinate (SOLU-MEDROL) 125 mg/2 mL injection 125 mg (125 mg Intravenous Given 03/27/19 1922)  albuterol (VENTOLIN HFA) 108 (90 Base) MCG/ACT inhaler 2 puff (2 puffs Inhalation Given 03/27/19 1923)  AeroChamber Plus Flo-Vu Medium MISC 1 each (1 each Other Given 03/27/19 1924)  iohexol (OMNIPAQUE) 350 MG/ML injection 100 mL (100 mLs Intravenous Contrast Given 03/27/19 2059)    ED Course  I have reviewed the triage vital signs and the nursing notes.  Pertinent labs & imaging results that were available during my care of the patient were reviewed by me and considered in my medical decision making (see chart for details).    MDM Rules/Calculators/A&P                      Pt has no hx of CHF.  He does have some pulmonary edema.  Pt is ambulated and oxygen is ok, but his work of breathing went up significantly.  The pt is placed on 2L oxygen.  Pt d/w Dr. Debby Bud (triad) for admission.   CRITICAL CARE Performed by: Jacalyn Lefevre   Total critical care time: 30 minutes  Critical care time was exclusive of separately billable procedures and treating other patients.  Critical care was necessary to treat or prevent imminent or life-threatening deterioration.  Critical care was time spent personally by  me on the following activities: development of treatment plan with patient and/or surrogate as well as nursing, discussions with consultants, evaluation of patient's response to treatment, examination of patient, obtaining history from patient or surrogate, ordering and performing treatments and interventions, ordering and review of laboratory studies, ordering and review of radiographic studies, pulse oximetry and re-evaluation  of patient's condition.   Final Clinical Impression(s) / ED Diagnoses Final diagnoses:  Acute on chronic congestive heart failure, unspecified heart failure type ([place])  COPD exacerbation ([place])  Tobacco abuse    Rx / DC Orders ED Discharge Orders    None       Isla Pence, MD 03/27/19 2321

## 2019-03-28 ENCOUNTER — Inpatient Hospital Stay (HOSPITAL_COMMUNITY): Payer: Medicaid Other

## 2019-03-28 ENCOUNTER — Encounter (HOSPITAL_COMMUNITY): Payer: Self-pay | Admitting: Internal Medicine

## 2019-03-28 DIAGNOSIS — Z880 Allergy status to penicillin: Secondary | ICD-10-CM | POA: Diagnosis not present

## 2019-03-28 DIAGNOSIS — R0602 Shortness of breath: Secondary | ICD-10-CM | POA: Diagnosis present

## 2019-03-28 DIAGNOSIS — I5021 Acute systolic (congestive) heart failure: Secondary | ICD-10-CM

## 2019-03-28 DIAGNOSIS — D751 Secondary polycythemia: Secondary | ICD-10-CM | POA: Diagnosis present

## 2019-03-28 DIAGNOSIS — I509 Heart failure, unspecified: Secondary | ICD-10-CM

## 2019-03-28 DIAGNOSIS — J9601 Acute respiratory failure with hypoxia: Secondary | ICD-10-CM | POA: Diagnosis present

## 2019-03-28 DIAGNOSIS — Z8616 Personal history of COVID-19: Secondary | ICD-10-CM | POA: Diagnosis not present

## 2019-03-28 DIAGNOSIS — Z9114 Patient's other noncompliance with medication regimen: Secondary | ICD-10-CM | POA: Diagnosis not present

## 2019-03-28 DIAGNOSIS — F17210 Nicotine dependence, cigarettes, uncomplicated: Secondary | ICD-10-CM | POA: Diagnosis present

## 2019-03-28 DIAGNOSIS — I4581 Long QT syndrome: Secondary | ICD-10-CM | POA: Diagnosis present

## 2019-03-28 DIAGNOSIS — Z7902 Long term (current) use of antithrombotics/antiplatelets: Secondary | ICD-10-CM | POA: Diagnosis not present

## 2019-03-28 DIAGNOSIS — I110 Hypertensive heart disease with heart failure: Secondary | ICD-10-CM | POA: Diagnosis not present

## 2019-03-28 DIAGNOSIS — K08409 Partial loss of teeth, unspecified cause, unspecified class: Secondary | ICD-10-CM | POA: Diagnosis present

## 2019-03-28 DIAGNOSIS — Z7982 Long term (current) use of aspirin: Secondary | ICD-10-CM | POA: Diagnosis not present

## 2019-03-28 DIAGNOSIS — Z791 Long term (current) use of non-steroidal anti-inflammatories (NSAID): Secondary | ICD-10-CM | POA: Diagnosis not present

## 2019-03-28 DIAGNOSIS — I5023 Acute on chronic systolic (congestive) heart failure: Secondary | ICD-10-CM | POA: Diagnosis present

## 2019-03-28 DIAGNOSIS — E785 Hyperlipidemia, unspecified: Secondary | ICD-10-CM | POA: Diagnosis present

## 2019-03-28 DIAGNOSIS — Z79899 Other long term (current) drug therapy: Secondary | ICD-10-CM | POA: Diagnosis not present

## 2019-03-28 DIAGNOSIS — I2510 Atherosclerotic heart disease of native coronary artery without angina pectoris: Secondary | ICD-10-CM | POA: Diagnosis present

## 2019-03-28 DIAGNOSIS — I255 Ischemic cardiomyopathy: Secondary | ICD-10-CM | POA: Diagnosis present

## 2019-03-28 DIAGNOSIS — I252 Old myocardial infarction: Secondary | ICD-10-CM | POA: Diagnosis not present

## 2019-03-28 DIAGNOSIS — Z955 Presence of coronary angioplasty implant and graft: Secondary | ICD-10-CM | POA: Diagnosis not present

## 2019-03-28 DIAGNOSIS — Z8249 Family history of ischemic heart disease and other diseases of the circulatory system: Secondary | ICD-10-CM | POA: Diagnosis not present

## 2019-03-28 LAB — ECHOCARDIOGRAM COMPLETE
Height: 74 in
Weight: 4507.97 oz

## 2019-03-28 LAB — HIV ANTIBODY (ROUTINE TESTING W REFLEX): HIV Screen 4th Generation wRfx: NONREACTIVE

## 2019-03-28 LAB — BRAIN NATRIURETIC PEPTIDE: B Natriuretic Peptide: 322.8 pg/mL — ABNORMAL HIGH (ref 0.0–100.0)

## 2019-03-28 MED ORDER — CLOPIDOGREL BISULFATE 75 MG PO TABS
75.0000 mg | ORAL_TABLET | Freq: Every day | ORAL | Status: DC
  Administered 2019-03-28: 75 mg via ORAL
  Filled 2019-03-28: qty 1

## 2019-03-28 MED ORDER — LISINOPRIL 10 MG PO TABS
10.0000 mg | ORAL_TABLET | Freq: Every day | ORAL | Status: DC
  Administered 2019-03-28: 10 mg via ORAL
  Filled 2019-03-28: qty 1

## 2019-03-28 MED ORDER — FUROSEMIDE 10 MG/ML IJ SOLN
40.0000 mg | Freq: Two times a day (BID) | INTRAMUSCULAR | Status: AC
  Administered 2019-03-28 (×2): 40 mg via INTRAVENOUS
  Filled 2019-03-28 (×2): qty 4

## 2019-03-28 MED ORDER — SODIUM CHLORIDE 0.9% FLUSH: 3.0000 mL | INTRAVENOUS | Status: DC | PRN

## 2019-03-28 MED ORDER — SODIUM CHLORIDE 0.9% FLUSH
3.0000 mL | Freq: Two times a day (BID) | INTRAVENOUS | Status: DC
  Administered 2019-03-28 – 2019-03-30 (×4): 3 mL via INTRAVENOUS

## 2019-03-28 MED ORDER — ACETAMINOPHEN 325 MG PO TABS: 650.0000 mg | ORAL_TABLET | ORAL | Status: DC | PRN

## 2019-03-28 MED ORDER — GABAPENTIN 300 MG PO CAPS
300.0000 mg | ORAL_CAPSULE | Freq: Three times a day (TID) | ORAL | Status: DC
  Administered 2019-03-28 – 2019-03-30 (×7): 300 mg via ORAL
  Filled 2019-03-28 (×7): qty 1

## 2019-03-28 MED ORDER — SODIUM CHLORIDE 0.9 % IV SOLN: 250.0000 mL | INTRAVENOUS | Status: DC | PRN

## 2019-03-28 MED ORDER — ALBUTEROL SULFATE (2.5 MG/3ML) 0.083% IN NEBU: 2.5000 mg | INHALATION_SOLUTION | Freq: Four times a day (QID) | RESPIRATORY_TRACT | Status: DC | PRN

## 2019-03-28 MED ORDER — ASPIRIN EC 81 MG PO TBEC
81.0000 mg | DELAYED_RELEASE_TABLET | Freq: Every day | ORAL | Status: DC
  Administered 2019-03-28 – 2019-03-30 (×3): 81 mg via ORAL
  Filled 2019-03-28 (×3): qty 1

## 2019-03-28 MED ORDER — ONDANSETRON HCL 4 MG/2ML IJ SOLN: 4.0000 mg | Freq: Four times a day (QID) | INTRAMUSCULAR | Status: DC | PRN

## 2019-03-28 MED ORDER — MELOXICAM 7.5 MG PO TABS
7.5000 mg | ORAL_TABLET | Freq: Two times a day (BID) | ORAL | Status: DC
  Administered 2019-03-28 – 2019-03-30 (×5): 7.5 mg via ORAL
  Filled 2019-03-28 (×6): qty 1

## 2019-03-28 MED ORDER — METOPROLOL SUCCINATE ER 25 MG PO TB24
25.0000 mg | ORAL_TABLET | Freq: Every day | ORAL | Status: DC
  Administered 2019-03-28: 25 mg via ORAL
  Filled 2019-03-28: qty 1

## 2019-03-28 MED ORDER — HEPARIN SODIUM (PORCINE) 5000 UNIT/ML IJ SOLN
5000.0000 [IU] | Freq: Three times a day (TID) | INTRAMUSCULAR | Status: DC
  Administered 2019-03-28 – 2019-03-30 (×7): 5000 [IU] via SUBCUTANEOUS
  Filled 2019-03-28 (×7): qty 1

## 2019-03-28 MED ORDER — SERTRALINE HCL 50 MG PO TABS
50.0000 mg | ORAL_TABLET | Freq: Every day | ORAL | Status: DC
  Administered 2019-03-28 – 2019-03-30 (×3): 50 mg via ORAL
  Filled 2019-03-28 (×3): qty 1

## 2019-03-28 NOTE — Progress Notes (Signed)
Frank Flowers is a 59 y.o. male with medical history significant of CAD s/p MI 2013, s/p cath revealing obstruction of distal RCA 2013. LVEF at that time 30%. Limited cardiology f/u 2/2 imprisonment who presented with worsening dyspnea and hypoxia for several weeks.  Associated with orthopnea.  Admits to noncompliance with his medications.  Presented to the ED for further evaluation.  Elevated BNP on presentation greater than 300.  Chest x-ray suggestive of pulmonary edema.  TRH asked to admit.  Started on IV diuretics.  Ongoing diuresing.  2D echo has been completed, results are pending.  03/28/19: Seen and examined.  Reports persistent dyspnea with minimal movement.  Denies any chest pain at the time of this evaluation.  Continues to diurese.  Good response.  Net I&O -1.0 L.  Continue to monitor blood pressure, electrolytes and urine output while on IV diuretics.   Please refer to H&P dictated by my partner Dr. Debby Bud on 03/28/2019 for further details of the assessment and plan.

## 2019-03-28 NOTE — Plan of Care (Signed)
Taking lasix as ordered- monitoring intake and using oxygen as when needed

## 2019-03-28 NOTE — Progress Notes (Signed)
  Echocardiogram 2D Echocardiogram has been performed.  Frank Flowers 03/28/2019, 8:52 AM
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
## 2019-03-29 ENCOUNTER — Encounter (HOSPITAL_COMMUNITY): Payer: Self-pay | Admitting: Internal Medicine

## 2019-03-29 DIAGNOSIS — I5023 Acute on chronic systolic (congestive) heart failure: Secondary | ICD-10-CM

## 2019-03-29 LAB — BASIC METABOLIC PANEL
Anion gap: 10 (ref 5–15)
BUN: 26 mg/dL — ABNORMAL HIGH (ref 6–20)
CO2: 25 mmol/L (ref 22–32)
Calcium: 8.8 mg/dL — ABNORMAL LOW (ref 8.9–10.3)
Chloride: 103 mmol/L (ref 98–111)
Creatinine, Ser: 1.2 mg/dL (ref 0.61–1.24)
GFR calc Af Amer: >60 mL/min (ref >60)
GFR calc non Af Amer: >60 mL/min (ref >60)
Glucose, Bld: 100 mg/dL — ABNORMAL HIGH (ref 70–99)
Potassium: 4 mmol/L (ref 3.5–5.1)
Sodium: 138 mmol/L (ref 135–145)

## 2019-03-29 MED ORDER — FUROSEMIDE 10 MG/ML IJ SOLN
40.0000 mg | Freq: Two times a day (BID) | INTRAMUSCULAR | Status: DC
  Administered 2019-03-29 (×2): 40 mg via INTRAVENOUS
  Filled 2019-03-29 (×3): qty 4

## 2019-03-29 MED ORDER — CARVEDILOL 3.125 MG PO TABS
3.1250 mg | ORAL_TABLET | Freq: Two times a day (BID) | ORAL | Status: DC
  Administered 2019-03-29 – 2019-03-30 (×3): 3.125 mg via ORAL
  Filled 2019-03-29 (×3): qty 1

## 2019-03-29 MED ORDER — LOSARTAN POTASSIUM 25 MG PO TABS
25.0000 mg | ORAL_TABLET | Freq: Every day | ORAL | Status: DC
  Administered 2019-03-29 – 2019-03-30 (×2): 25 mg via ORAL
  Filled 2019-03-29 (×2): qty 1

## 2019-03-29 MED ORDER — ROSUVASTATIN CALCIUM 20 MG PO TABS
40.0000 mg | ORAL_TABLET | Freq: Every day | ORAL | Status: DC
  Administered 2019-03-29: 40 mg via ORAL
  Filled 2019-03-29: qty 2
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
## 2019-03-29 NOTE — Progress Notes (Signed)
SATURATION QUALIFICATIONS: (This note is used to comply with regulatory documentation for home oxygen)  Patient Saturations on Room Air at Rest = 98%  Patient Saturations on Room Air while Ambulating = 96%  Patient Saturations on 0 Liters of oxygen while Ambulating = 96%  Please briefly explain why patient needs home oxygen: Patient does not need home oxygen based on rest and ambulation assessment.
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
## 2019-03-29 NOTE — Progress Notes (Signed)
RN gave patient "Living Better with Heart Failure" patient information packet.  RN reviewed the material with the patient, explaining the importance of the "Everyday" Checklist to stay within the "Green Zone."  Patient stated understanding of the interventions included in the packet as well as understanding the importance of discontinuing the use of tobacco.

## 2019-03-30 ENCOUNTER — Other Ambulatory Visit: Payer: Self-pay | Admitting: Medical

## 2019-03-30 DIAGNOSIS — I5021 Acute systolic (congestive) heart failure: Secondary | ICD-10-CM

## 2019-03-30 DIAGNOSIS — I2510 Atherosclerotic heart disease of native coronary artery without angina pectoris: Secondary | ICD-10-CM

## 2019-03-30 DIAGNOSIS — I5041 Acute combined systolic (congestive) and diastolic (congestive) heart failure: Secondary | ICD-10-CM

## 2019-03-30 DIAGNOSIS — I5043 Acute on chronic combined systolic (congestive) and diastolic (congestive) heart failure: Secondary | ICD-10-CM

## 2019-03-30 LAB — BASIC METABOLIC PANEL
Anion gap: 10 (ref 5–15)
BUN: 32 mg/dL — ABNORMAL HIGH (ref 6–20)
CO2: 26 mmol/L (ref 22–32)
Calcium: 8.9 mg/dL (ref 8.9–10.3)
Chloride: 101 mmol/L (ref 98–111)
Creatinine, Ser: 1.21 mg/dL (ref 0.61–1.24)
GFR calc Af Amer: >60 mL/min (ref >60)
GFR calc non Af Amer: >60 mL/min (ref >60)
Glucose, Bld: 97 mg/dL (ref 70–99)
Potassium: 4 mmol/L (ref 3.5–5.1)
Sodium: 137 mmol/L (ref 135–145)

## 2019-03-30 MED ORDER — CARVEDILOL 3.125 MG PO TABS: 3.1250 mg | ORAL_TABLET | Freq: Two times a day (BID) | ORAL | 0 refills | Status: DC

## 2019-03-30 MED ORDER — LOSARTAN POTASSIUM 25 MG PO TABS: 25.0000 mg | ORAL_TABLET | Freq: Every day | ORAL | 0 refills | Status: DC

## 2019-03-30 MED ORDER — FUROSEMIDE 40 MG PO TABS: 40.0000 mg | ORAL_TABLET | Freq: Every day | ORAL | 0 refills | Status: DC

## 2019-03-30 MED ORDER — FUROSEMIDE 40 MG PO TABS
40.0000 mg | ORAL_TABLET | Freq: Every day | ORAL | Status: DC
  Administered 2019-03-30: 40 mg via ORAL
  Filled 2019-03-30: qty 1

## 2019-03-30 MED ORDER — ROSUVASTATIN CALCIUM 40 MG PO TABS: 40.0000 mg | ORAL_TABLET | Freq: Every day | ORAL | 0 refills | Status: DC

## 2019-03-30 NOTE — Discharge Instructions (Signed)
Heart Failure and Exercise Heart failure is a condition in which the heart does not fill or pump enough blood and oxygen to support your body and its functions. Heart failure is a long-term (chronic) condition. Living with heart failure can be challenging. However, following your health care provider's instructions about a healthy lifestyle may help improve your symptoms. This includes choosing the right exercise plan. Doing daily physical activity is important after a diagnosis of heart failure. You may have some activity restrictions, so talk to your health care provider before doing any exercises. What are the benefits of exercise? Exercise may:  Make your heart muscles stronger.  Lower your blood pressure.  Lower your cholesterol.  Help you lose weight.  Help your bones stay strong.  Improve your blood circulation.  Help your body use oxygen better. This relieves symptoms such as fatigue and shortness of breath.  Help your mental health by lowering the risk of depression and other problems.  Improve your quality of life.  Decrease your chance of hospital admission for heart failure. What is an exercise plan? An exercise plan is a set of specific exercises and training activities. You will work with your health care provider to create the exercise plan that works for you. The plan may include:  Different types of exercises and how to do them.  Cardiac rehabilitation exercises. These are supervised programs that are designed to strengthen your heart. What are strengthening exercises? Strengthening exercises are a type of physical activity that involves using resistance to improve your muscle strength. Strengthening exercises usually have repetitive motions. These types of exercises can include:  Lifting weights.  Using weight machines.  Using resistance tubes and bands.  Using kettlebells.  Using your body weight, such as doing push-ups or squats. What are balance  exercises? Balance exercises are another type of physical activity. They strengthen the muscles of the back, stomach, and pelvis (core muscles) and improve your balance. They can also lower your risk of falling. These types of exercises can include:  Standing on one leg.  Walking backward, sideways, and in a straight line.  Standing up after sitting, without using your hands.  Shifting your weight from one leg to the other.  Lifting one leg in front of you.  Doing tai chi. This is a type of exercise that uses slow movements and deep breathing. How can I increase my flexibility? Having better flexibility can keep you from falling. It can also lengthen your muscles, improve your range of motion, and help your joints. You can increase your flexibility by:  Doing tai chi.  Doing yoga.  Stretching. How much aerobic exercise should I get?  Aerobic exercises strengthen your breathing and circulation system and increase your body's use of oxygen. Examples of aerobic exercise include biking, walking, running, and swimming. Talk to your health care provider to find out how much aerobic exercise is safe for you.  To do these exercises:  Start exercising slowly, limiting the amount of time at first. You may need to start with 5 minutes of aerobic exercise every day.  Slowly add more minutes until you can safely do at least 30 minutes of exercise at least 4 days a week. Summary  Daily physical activity is important after a diagnosis of heart failure.  Exercise can make your heart muscles stronger. It also offers other benefits that will improve your health.  Talk to your health care provider before doing any exercises. This information is not intended to replace  advice given to you by your health care provider. Make sure you discuss any questions you have with your health care provider. Document Revised: 05/14/2016 Document Reviewed: 05/11/2016 Elsevier Patient Education  2020 [place] With Heart Failure  Heart failure is a long-term (chronic) condition in which the heart cannot pump enough blood through the body. When this happens, parts of the body do not get the blood and oxygen they need. There is no cure for heart failure at this time, so it is important for you to take good care of yourself and follow the treatment plan set by your health care provider. If you are living with heart failure, there are ways to help you manage the disease. Follow these instructions at home: Living with heart failure requires you to make changes in your life. Your health care team will teach you about the changes you need to make in order to relieve your symptoms and lower your risk of going to the hospital. Follow the treatment plan as set by your health care provider. Medicines Medicines are important in reducing your heart's workload, slowing the progression of heart failure, and improving your symptoms.  Take over-the-counter and prescription medicines only as told by your health care provider.  Do not stop taking your medicine unless your health care provider tells you to do that.  Do not skip any dose of your medicine.  Refill prescriptions before you run out of medicine. You need your medicines every day. Eating and drinking   Eat heart-healthy foods. Talk with a dietitian to make an eating plan that is right for you. ? If directed by your health care provider:  Limit salt (sodium). Lowering your sodium intake may reduce symptoms of heart failure. Ask a dietitian to recommend heart-healthy seasonings.  Limit your fluid intake. Fluid restriction may reduce symptoms of heart failure. ? Use low-fat cooking methods instead of frying. Low-fat methods include roasting, grilling, broiling, baking, poaching, steaming, and stir-frying. ? Choose foods that contain no trans fat and are low in saturated fat and cholesterol. Healthy choices include fresh or frozen fruits and  vegetables, fish, lean meats, legumes, fat-free or low-fat dairy products, and whole-grain or high-fiber foods.  Limit alcohol intake to no more than 1 drink a day for nonpregnant women and 2 drinks a day for men. One drink equals 12 oz of beer, 5 oz of wine, or 1 oz of hard liquor. ? Drinking more than that is harmful to your heart. Tell your health care provider if you drink alcohol several times a week. ? Talk with your health care provider about whether any level of alcohol use is safe for you. Activity   Ask your health care provider about attending cardiac rehabilitation. These programs include aerobic physical activity, which provides many benefits for your heart.  If no cardiac rehabilitation program is available, ask your health care provider what aerobic exercises are safe for you to do. Lifestyle Make the lifestyle changes recommended by your health care provider. In general:  Lose weight if your health care provider tells you to do that. Weight loss may reduce symptoms of heart failure.  Do not use any products that contain nicotine or tobacco, such as cigarettes or e-cigarettes. If you need help quitting, ask your health care provider.  Do not use street (illegal) drugs.  Return to your normal activities as told by your health care provider. Ask your health care provider what activities are safe for you. General instructions  Make sure you weigh yourself every day to track your weight. Rapid weight gain may indicate an increase in fluid in your body and may increase the workload of your heart. ? Weigh yourself every morning. Do this after you urinate but before you eat breakfast. ? Wear the same type of clothing, without shoes, each time you weigh yourself. ? Weigh yourself on the same scale and in the same spot each time.  Living with chronic heart failure often leads to emotions such as fear, stress, anxiety, and depression. If you feel any of these emotions and need  help coping, contact your health care provider. Other ways to get help include: ? Talking to friends and family members about your condition. They can give you support and guidance. Explain your symptoms to them and, if comfortable, invite them to attend appointments or rehabilitation with you. ? Joining a support group for people with chronic heart failure. Talking with other people who have the same symptoms may give you new ways of coping with your disease and your emotions.  Stay up to date with your shots (vaccines). Staying current on pneumococcal and influenza vaccines is especially important in preventing germs from attacking your airways (respiratory infections).  Keep all follow-up visits as told by your health care provider. This is important. How to recognize changes in your condition You and your family members need to know what changes to watch for in your condition. Watch for the following changes and report them to your health care provider:  Sudden weight gain. Ask your health care provider what amount of weight gain to report.  Shortness of breath: ? Feeling short of breath while at rest, with no exercise or activity that required great effort. ? Feeling breathless with activity.  Swelling of your lower legs or ankles.  Difficulty sleeping: ? You wake up feeling short of breath. ? You have to use more pillows to raise your head in order to sleep.  Frequent, dry, hacking cough.  Loss of appetite.  Feeling more tired all the time.  Depression or feelings of sadness or hopelessness.  Bloating in the stomach. Where to find more information  Local support groups. Ask your health care provider about groups near you.  The American Heart Association: www.heart.org Contact a health care provider if:  You have a rapid weight gain.  You have increasing shortness of breath that is unusual for you.  You are unable to participate in your usual physical  activities.  You tire easily.  You cough more than normal, especially with physical activity.  You have any swelling or more swelling in areas such as your hands, feet, ankles, or abdomen.  You feel like your heart is beating quickly (palpitations).  You become dizzy or light-headed when you stand up. Get help right away if:  You have difficulty breathing.  You notice or your family notices a change in your awareness, such as having trouble staying awake or having difficulty with concentration.  You have pain or discomfort in your chest.  You have an episode of fainting (syncope). Summary  There is no cure for heart failure, so it is important for you to take good care of yourself and follow the treatment plan set by your health care provider.  Medicines are important in reducing your heart's workload, slowing the progression of heart failure, and improving your symptoms.  Living with chronic heart failure often leads to emotions such as fear, stress, anxiety, and depression. If you are feeling  any of these emotions and need help coping, contact your health care provider. This information is not intended to replace advice given to you by your health care provider. Make sure you discuss any questions you have with your health care provider. Document Revised: 12/10/2016 Document Reviewed: 05/12/2016 Elsevier Patient Education  2020 [place].   Heart Failure Medicines  Heart failure is a condition in which the heart cannot pump enough blood through the body. This can cause symptoms such as shortness of breath, fatigue, and confusion. There are two types of heart failure:  Heart failure with reduced ejection fraction. In this type, the heart muscle is weak.  Heart failure with preserved ejection fraction. In this type, the heart muscle does not fill with blood properly and may be stiff. There is no cure for heart failure. However, being treated with medicines and following  your health care provider's instructions about a healthy lifestyle can help you stay active, avoid problems, and live longer. Talk to your health care provider about all medicines that you are taking, how often you should take them, and what possible problems (side effects) they may cause. Talk with your health care provider if you have difficulty affording your medicines. What are some common medicines for heart failure? The medicines that are prescribed for you will depend on your symptoms, the type of heart failure you have, and the cause of your heart failure. In some cases, you may need to take more than one medicine. You will be prescribed the following medicines according to your type of heart failure: Heart failure with reduced ejection fraction  Beta-blockers.  Angiotensin-converting enzyme (ACE) inhibitors.  Angiotensin II receptor blockers (ARBs).  Angiotensin receptor neprilysin inhibitors (ARNIs).  Aldosterone antagonists.  Diuretics.  Digoxin.  Nitrates. Heart failure with preserved ejection fraction  Medicines to control blood pressure, including: ? Beta-blockers. ? Angiotensin-converting enzyme (ACE) inhibitors. ? Angiotensin II receptor blockers (ARBs).  Diuretics.  Aldosterone antagonists. What should I know about beta-blockers?  These medicines lower your blood pressure and slow your heart rate. This helps to lessen your heart's workload.  They can help to relieve chest pain (angina).  They can help to improve your heart's ability to pump.  They may cause asthma attacks and shortness of breath.  Because these medicines slow your heart rate, it is important not to overwork yourself while exercising. Talk to your health care provider about what your target heart rate should be while you exercise.  These medicines can hide the symptoms of low blood sugar (glucose), which is also called hypoglycemia. If you have diabetes, make sure to check your blood glucose  carefully. If you have hypoglycemia, talk to your health care provider about adjusting your medicines.  Beta-blockers may make you feel dizzy or light-headed at first. Do not drive or use heavy machinery when you first start these medicines. Ask your health care provider when it is safe for you to drive. What should I know about ACE inhibitors or ARBs?  These medicines help to widen arteries and veins. This action lowers your blood pressure and lessens the strain on your heart, making it easier for your heart to pump.  They can help to lessen the symptoms of heart failure.  ARBs are often used if a person cannot take ACE inhibitors.  ACE inhibitors may cause a dry cough.  In rare cases, ACE inhibitors and ARBs can cause swelling of the tongue or lips, other swelling, taste problems, and skin rashes. If these symptoms  occur, stop taking the medicines and contact your health care provider.  Do not take ACE inhibitors if you are pregnant or may become pregnant. These medicines can cause health problems in an unborn baby.  These medicines may cause dizziness. You may need regular checkups and blood tests to monitor how they are working. What should I know about ARNIs?  These medicines are a combination of an ARB and another medicine. They lower your blood pressure.  Side effects may include dry cough, dizziness, low blood pressure, and kidney problems.  Do not take ARNIs if you are already taking ACE inhibitors or ARBs.  You may notice increased urination when taking these medicines.  ARNIs can raise the amount of potassium in the blood. Your potassium levels will be monitored regularly by your health care provider. What should I know about aldosterone antagonists?  They help the body to remove excess sodium through urination. This helps to lessen the amount of blood that the heart needs to pump.  They can also help to lower blood pressure and improve the heart's ability to pump  blood.  They may cause dizziness, diarrhea, coughing, or flu-like symptoms.  They should not be used if you have type 2 diabetes.  They can raise the amount of potassium in the blood. Your potassium levels will be monitored regularly by your health care provider.  These medicines can make men's breasts large and tender. What should I know about diuretics?  Diuretics are medicines that help the body get rid of excess fluid through urination. They can also help lessen your heart's workload.  They help to lessen fluid buildup in the lungs, ankles, and feet.  They help to lower your blood pressure.  They can worsen problems with controlling urination (urinary incontinence).  They may cause dizziness, headaches, muscle cramps, and an upset stomach.  They can cause weak muscles, dry mouth, or confusion. It is important to drink plenty of fluids while taking these medicines, especially while exercising or on hot days. What should I know about digoxin?  Digoxin helps the heart pump more blood efficiently. It also lowers your heart rate.  It can help ease heart failure symptoms and may be used if other medicines do not work.  It can also help with irregular heartbeat (arrhythmia).  It may cause stomach problems, fatigue, headache, drowsiness, or vision problems. What should I know about nitrates?  Nitrates relax the blood vessels and increase oxygen and blood supply to the heart. They also lower the blood pressure.  They are usually taken to lessen chest pain.  They may cause headaches, flushing, or irregular heartbeat. Summary  A healthy lifestyle and treatment with medicine will relieve symptoms of heart failure.  In some cases, you may need to take more than one medicine.  It is important to talk to your health care provider about how often you should take your medicines. Do not skip a dose or change your dosage.  Talk to your health care provider about possible side effects  of these medicines. This information is not intended to replace advice given to you by your health care provider. Make sure you discuss any questions you have with your health care provider. Document Revised: 01/12/2017 Document Reviewed: 05/14/2016 Elsevier Patient Education  [place].   Heart Failure, Self Care Heart failure is a serious condition. This document explains the things you need to do to take care of yourself after a heart failure diagnosis. You may be asked to change  your diet, take certain medicines, and make other lifestyle changes in order to stay as healthy as possible. Your health care provider may also give you more specific instructions. If you have problems or questions, contact your health care provider. What are the risks? Having heart failure puts you at higher risk for certain problems. These problems can get worse if you do not take good care of yourself. Problems may include:  Blood clotting problems. This may cause a stroke.  Damage to the kidneys, liver, or lungs.  Abnormal heart rhythms. Supplies needed:  Scale for monitoring weight.  Blood pressure monitor.  Notebook.  Medicines. How to care for yourself when you have heart failure Medicines Take over-the-counter and prescription medicines only as told by your health care provider. Medicines reduce the workload of your heart, slow the progression of heart failure, and improve symptoms. Take your medicines every day.  Do not stop taking your medicine unless your health care provider tells you to do so.  Do not skip any dose of medicine.  Refill your prescriptions before you run out of medicine. Eating and drinking   Eat heart-healthy foods. Talk with a dietitian to make an eating plan that is right for you. ? Choose foods that contain no trans fat and are low in saturated fat and cholesterol. Healthy choices include fresh or frozen fruits and vegetables, fish, lean meats, legumes,  fat-free or low-fat dairy products, and whole-grain or high-fiber foods. ? Limit salt (sodium) if told by your health care provider. Sodium restriction may reduce symptoms of heart failure. Ask a dietitian to recommend heart-healthy seasonings. ? Use healthy cooking methods instead of frying. Healthy methods include roasting, grilling, broiling, baking, poaching, steaming, and stir-frying.  Limit your fluid intake, if directed by your health care provider. Fluid restriction may reduce symptoms of heart failure. Alcohol use  Do not drink alcohol if: ? Your health care provider tells you not to drink. ? Your heart was damaged by alcohol, or you have severe heart failure. ? You are pregnant, may be pregnant, or are planning to become pregnant.  If you drink alcohol: ? Limit how much you use to:  0-1 drink a day for women.  0-2 drinks a day for men. ? Be aware of how much alcohol is in your drink. In the [place] one drink equals one 12 oz bottle of beer (355 mL), one 5 oz glass of wine (148 mL), or one 1 oz glass of hard liquor (44 mL). Lifestyle   Do not use any products that contain nicotine or tobacco, such as cigarettes, e-cigarettes, and chewing tobacco. If you need help quitting, ask your health care provider. ? Do not use nicotine gum or patches before talking to your health care provider.  Do not use illegal drugs.  Work with your health care provider to safely reach the right body weight.  Do physical activity if told by your health care provider. Talk to your health care provider before you begin an exercise if: ? You are an older adult. ? You have severe heart failure.  Learn to manage stress. If you need help to do this, ask your health care provider.  Participate in or seek rehabilitation as needed to keep or improve your independence and quality of life.  Plan rest periods when you get tired. Monitoring important information   Weigh yourself every day. This will help  you to notice if too much fluid is building up in your body. ? Weigh yourself  every morning after you urinate and before you eat breakfast. ? Wear the same amount of clothing each time you weigh yourself. ? Record your daily weight. Provide your health care provider with your weight record.  Monitor and record your pulse and blood pressure as told by your health care provider. Dealing with extreme temperatures  If the weather is extremely hot: ? Avoid vigorous physical activity. ? Use air conditioning or fans, or find a cooler location. ? Avoid caffeine and alcohol. ? Wear loose-fitting, lightweight, and light-colored clothing.  If the weather is extremely cold: ? Avoid vigorous activity. ? Layer your clothes. ? Wear mittens or gloves, a hat, and a scarf when you go outside. ? Avoid alcohol. Follow these instructions at home:  Stay up to date with vaccines. Pneumococcal and flu (influenza) vaccines are especially important in preventing infections of the airways.  Keep all follow-up visits as told by your health care provider. This is important. Contact a health care provider if you:  Have a rapid weight gain.  Have increasing shortness of breath.  Are unable to participate in your usual physical activities.  Get tired easily.  Cough more than normal, especially with physical activity.  Lose your appetite or feel nauseous.  Have any swelling or more swelling in areas such as your hands, feet, ankles, or abdomen.  Are unable to sleep because it is hard to breathe.  Feel like your heart is beating quickly (palpitations).  Become dizzy or light-headed when you stand up. Get help right away if you:  Have trouble breathing.  Notice or your family notices a change in your awareness, such as having trouble staying awake or concentrating.  Have pain or discomfort in your chest.  Have an episode of fainting (syncope). These symptoms may represent a serious problem that is  an emergency. Do not wait to see if the symptoms will go away. Get medical help right away. Call your local emergency services (911 in the [place] Do not drive yourself to the hospital. Summary  Heart failure is a serious condition. To care for yourself, you may be asked to change your diet, take certain medicines, and make other lifestyle changes.  Take your medicines every day. Do not stop taking them unless your health care provider tells you to do so.  Eat heart-healthy foods, such as fresh or frozen fruits and vegetables, fish, lean meats, legumes, fat-free or low-fat dairy products, and whole-grain or high-fiber foods.  Ask your health care provider if you have any alcohol restrictions. You may have to stop drinking alcohol if you have severe heart failure.  Contact your health care provider if you notice problems, such as rapid weight gain or a fast heartbeat. Get help right away if you faint, or have chest pain or trouble breathing. This information is not intended to replace advice given to you by your health care provider. Make sure you discuss any questions you have with your health care provider. Document Revised: 04/11/2018 Document Reviewed: 04/12/2018 Elsevier Patient Education  [place].   Heart Failure, Self Care Heart failure is a serious condition. This sheet explains things you need to do to take care of yourself at home. To help you stay as healthy as possible, you may be asked to change your diet, take certain medicines, and make other changes in your life. Your doctor may also give you more specific instructions. If you have problems or questions, call your doctor. What are the risks? Having heart failure  makes it more likely for you to have some problems. These problems can get worse if you do not take good care of yourself. Problems may include:  Blood clotting problems. This may cause a stroke.  Damage to the kidneys, liver, or lungs.  Abnormal heart  rhythms. Supplies needed:  Scale for weighing yourself.  Blood pressure monitor.  Notebook.  Medicines. How to care for yourself when you have heart failure Medicines Take over-the-counter and prescription medicines only as told by your doctor. Take your medicines every day.  Do not stop taking your medicine unless your doctor tells you to do so.  Do not skip any medicines.  Get your prescriptions refilled before you run out of medicine. This is important. Eating and drinking   Eat heart-healthy foods. Talk with a diet specialist (dietitian) to create an eating plan.  Choose foods that: ? Have no trans fat. ? Are low in saturated fat and cholesterol.  Choose healthy foods, such as: ? Fresh or frozen fruits and vegetables. ? Fish. ? Low-fat (lean) meats. ? Legumes, such as beans, peas, and lentils. ? Fat-free or low-fat dairy products. ? Whole-grain foods. ? High-fiber foods.  Limit salt (sodium) if told by your doctor. Ask your diet specialist to tell you which seasonings are healthy for your heart.  Cook in healthy ways instead of frying. Healthy ways of cooking include roasting, grilling, broiling, baking, poaching, steaming, and stir-frying.  Limit how much fluid you drink, if told by your doctor. Alcohol use  Do not drink alcohol if: ? Your doctor tells you not to drink. ? Your heart was damaged by alcohol, or you have very bad heart failure. ? You are pregnant, may be pregnant, or are planning to become pregnant.  If you drink alcohol: ? Limit how much you use to:  0-1 drink a day for women.  0-2 drinks a day for men. ? Be aware of how much alcohol is in your drink. In the [place] one drink equals one 12 oz bottle of beer (355 mL), one 5 oz glass of wine (148 mL), or one 1 oz glass of hard liquor (44 mL). Lifestyle   Do not use any products that contain nicotine or tobacco, such as cigarettes, e-cigarettes, and chewing tobacco. If you need help quitting,  ask your doctor. ? Do not use nicotine gum or patches before talking to your doctor.  Do not use illegal drugs.  Lose weight if told by your doctor.  Do physical activity if told by your doctor. Talk to your doctor before you begin an exercise if: ? You are an older adult. ? You have very bad heart failure.  Learn to manage stress. If you need help, ask your doctor.  Get rehab (rehabilitation) to help you stay independent and to help with your quality of life.  Plan time to rest when you get tired. Check weight and blood pressure   Weigh yourself every day. This will help you to know if fluid is building up in your body. ? Weigh yourself every morning after you pee (urinate) and before you eat breakfast. ? Wear the same amount of clothing each time. ? Write down your daily weight. Give your record to your doctor.  Check and write down your blood pressure as told by your doctor.  Check your pulse as told by your doctor. Dealing with very hot and very cold weather  If it is very hot: ? Avoid activities that take a lot of energy. ?  Use air conditioning or fans, or find a cooler place. ? Avoid caffeine and alcohol. ? Wear clothing that is loose-fitting, lightweight, and light-colored.  If it is very cold: ? Avoid activities that take a lot of energy. ? Layer your clothes. ? Wear mittens or gloves, a hat, and a scarf when you go outside. ? Avoid alcohol. Follow these instructions at home:  Stay up to date with shots (vaccines). Get pneumococcal and flu (influenza) shots.  Keep all follow-up visits as told by your doctor. This is important. Contact a doctor if:  You gain weight quickly.  You have increasing shortness of breath.  You cannot do your normal activities.  You get tired easily.  You cough a lot.  You don't feel like eating or feel like you may vomit (nauseous).  You become puffy (swell) in your hands, feet, ankles, or belly (abdomen).  You cannot  sleep well because it is hard to breathe.  You feel like your heart is beating fast (palpitations).  You get dizzy when you stand up. Get help right away if:  You have trouble breathing.  You or someone else notices a change in your behavior, such as having trouble staying awake.  You have chest pain or discomfort.  You pass out (faint). These symptoms may be an emergency. Do not wait to see if the symptoms will go away. Get medical help right away. Call your local emergency services (911 in the [place] Do not drive yourself to the hospital. Summary  Heart failure is a serious condition. To care for yourself, you may have to change your diet, take medicines, and make other lifestyle changes.  Take your medicines every day. Do not stop taking them unless your doctor tells you to do so.  Eat heart-healthy foods, such as fresh or frozen fruits and vegetables, fish, lean meats, legumes, fat-free or low-fat dairy products, and whole-grain or high-fiber foods.  Ask your doctor if you can drink alcohol. You may have to stop alcohol use if you have very bad heart failure.  Contact your doctor if you gain weight quickly or feel that your heart is beating too fast. Get help right away if you pass out, or have chest pain or trouble breathing. This information is not intended to replace advice given to you by your health care provider. Make sure you discuss any questions you have with your health care provider. Document Revised: 04/11/2018 Document Reviewed: 04/12/2018 Elsevier Patient Education  [place].   Heart Failure Exacerbation  Heart failure is a condition in which the heart does not fill up with enough blood, and therefore does not pump enough blood and oxygen to the body. When this happens, parts of the body do not get the blood and oxygen they need to function properly. This can cause symptoms such as breathing problems, fatigue, swelling, and confusion. Heart failure  exacerbation refers to heart failure symptoms that get worse. The symptoms may get worse suddenly or develop slowly over time. Heart failure exacerbation is a serious medical problem that should be treated right away. What are the causes? A heart failure exacerbation can be triggered by:  Not taking your heart failure medicines correctly.  Infections.  Eating an unhealthy diet or a diet that is high in salt (sodium).  Drinking too much fluid.  Drinking alcohol.  Taking illegal drugs, such as cocaine or methamphetamine.  Not exercising. Other causes include:  Other heart conditions such as an irregular heartbeat (arrhythmia).  Anemia.  Other medical problems, such as kidney failure. Sometimes the cause of the exacerbation is not known. What are the signs or symptoms? When heart failure symptoms suddenly or slowly get worse, this may be a sign of heart failure exacerbation. Symptoms of heart failure include:  Breathing problems or shortness of breath.  Chronic coughing or wheezing.  Fatigue.  Nausea or lack of appetite.  Feeling light-headed.  Confusion or memory loss.  Increased heart rate or irregular heartbeat.  Buildup of fluid in the legs, ankles, feet, or abdomen.  Difficulty breathing when lying down. How is this diagnosed? This condition is diagnosed based on:  Your symptoms and medical history.  A physical exam. You may also have tests, including:  Electrocardiogram (ECG). This test measures the electrical activity of your heart.  Echocardiogram. This test uses sound waves to take a picture of your heart to see how well it works.  Blood tests.  Imaging tests, such as: ? Chest X-ray. ? MRI. ? Ultrasound.  Stress test. This test examines how well your heart functions when you exercise. Your heart is monitored while you exercise on a treadmill or exercise bike. If you cannot exercise, medicines may be used to increase your heartbeat in place of  exercise.  Cardiac catheterization. During this test, a thin, flexible tube (catheter) is inserted into a blood vessel and threaded up to your heart. This test allows your health care provider to check the arteries that lead to your heart (coronary arteries).  Right heart catheterization. During this test, the pressure in your heart is measured. How is this treated? This condition may be treated by:  Adjusting your heart medicines.  Maintaining a healthy lifestyle. This includes: ? Eating a heart-healthy diet that is low in sodium. ? Not using any products that contain nicotine or tobacco, such as cigarettes and e-cigarettes. ? Regular exercise. ? Monitoring your fluid intake. ? Monitoring your weight and reporting changes to your health care provider.  Treating sleep apnea, if you have this condition.  Surgery. This may include: ? Implanting a device that helps both sides of your heart contract at the same time (cardiac resynchronization therapy device). This can help with heart function and relieve heart failure symptoms. ? Implanting a device that can correct heart rhythm problems (implantable cardioverter defibrillator). ? Connecting a device to your heart to help it pump blood (ventricular assist device). ? Heart transplant. Follow these instructions at home: Medicines  Take over-the-counter and prescription medicines only as told by your health care provider.  Do not stop taking your medicines or change the amount you take. If you are having problems or side effects from your medicines, talk to your health care provider.  If you are having difficulty paying for your medicines, contact a social worker or your clinic. There are many programs to assist with medicine costs.  Talk to your health care provider before starting any new medicines or supplements.  Make sure your health care provider and pharmacist have a list of all the medicines you are taking. Eating and  drinking   Avoid drinking alcohol.  Eat a heart-healthy diet as told by your health care provider. This includes: ? Plenty of fruits and vegetables. ? Lean proteins. ? Low-fat dairy. ? Whole grains. ? Foods that are low in sodium. Activity   Exercise regularly as told by your health care provider. Balance exercise with rest.  Ask your health care provider what activities are safe for you. This includes sexual activity, exercise, and  daily tasks at home or work. Lifestyle  Do not use any products that contain nicotine or tobacco, such as cigarettes and e-cigarettes. If you need help quitting, ask your health care provider.  Maintain a healthy weight. Ask your health care provider what weight is healthy for you.  Consider joining a patient support group. This can help with emotional problems you may have, such as stress and anxiety. General instructions  Talk to your health care provider about flu and pneumonia vaccines.  Keep a list of medicines that you are taking. This may help in emergency situations.  Keep all follow-up visits as told by your health care provider. This is important. Contact a health care provider if:  You have questions about your medicines or you miss a dose.  You feel anxious, depressed, or stressed.  You have swelling in your feet, ankles, legs, or abdomen.  You have shortness of breath during activity or exercise.  You have a cough.  You have a fever.  You have trouble sleeping.  You gain 2-3 lb (1-1.4 kg) in 24 hours or 5 lb (2.3 kg) in a week. Get help right away if:  You have chest pain.  You have shortness of breath while resting.  You have severe fatigue.  You are confused.  You have severe dizziness.  You have a rapid or irregular heartbeat.  You have nausea or you vomit.  You have a cough that is worse at night or you cannot lie flat.  You have a cough that will not go away.  You have severe depression or  sadness. Summary  When heart failure symptoms get worse, it is called heart failure exacerbation.  Common causes of this condition include taking medicines incorrectly, infections, and drinking alcohol.  This condition may be treated by adjusting medicines, maintaining a healthy lifestyle, or surgery.  Do not stop taking your medicines or change the amount you take. If you are having problems or side effects from your medicines, talk to your health care provider. This information is not intended to replace advice given to you by your health care provider. Make sure you discuss any questions you have with your health care provider. Document Revised: 12/10/2016 Document Reviewed: 05/11/2016 Elsevier Patient Education  [place].   Heart Failure Action Plan A heart failure action plan helps you understand what to do when you have symptoms of heart failure. Follow the plan that was created by you and your health care provider. Review your plan each time you visit your health care provider. Red zone These signs and symptoms mean you should get medical help right away:  You have trouble breathing when resting.  You have a dry cough that is getting worse.  You have swelling or pain in your legs or abdomen that is getting worse.  You suddenly gain more than 2-3 lb (0.9-1.4 kg) in a day, or more than 5 lb (2.3 kg) in one week. This amount may be more or less depending on your condition.  You have trouble staying awake or you feel confused.  You have chest pain.  You do not have an appetite.  You pass out. If you experience any of these symptoms:  Call your local emergency services (911 in the [place] right away or seek help at the emergency department of the nearest hospital. Yellow zone These signs and symptoms mean your condition may be getting worse and you should make some changes:  You have trouble breathing when you are  active or you need to sleep with extra  pillows.  You have swelling in your legs or abdomen.  You gain 2-3 lb (0.9-1.4 kg) in one day, or 5 lb (2.3 kg) in one week. This amount may be more or less depending on your condition.  You get tired easily.  You have trouble sleeping.  You have a dry cough. If you experience any of these symptoms:  Contact your health care provider within the next day.  Your health care provider may adjust your medicines. Green zone These signs mean you are doing well and can continue what you are doing:  You do not have shortness of breath.  You have very little swelling or no new swelling.  Your weight is stable (no gain or loss).  You have a normal activity level.  You do not have chest pain or any other new symptoms. Follow these instructions at home:  Take over-the-counter and prescription medicines only as told by your health care provider.  Weigh yourself daily. Your target weight is __________ lb (__________ kg). ? Call your health care provider if you gain more than __________ lb (__________ kg) in a day, or more than __________ lb (__________ kg) in one week.  Eat a heart-healthy diet. Work with a diet and nutrition specialist (dietitian) to create an eating plan that is best for you.  Keep all follow-up visits as told by your health care provider. This is important. Where to find more information  American Heart Association: www.heart.org Summary  Follow the action plan that was created by you and your health care provider.  Get help right away if you have any symptoms in the Red zone. This information is not intended to replace advice given to you by your health care provider. Make sure you discuss any questions you have with your health care provider. Document Revised: 12/10/2016 Document Reviewed: 02/07/2016 Elsevier Patient Education  2020 [place].   Heart Failure Eating Plan Heart failure, also called congestive heart failure, occurs when your heart does not  pump blood well enough to meet your body's needs for oxygen-rich blood. Heart failure is a long-term (chronic) condition. Living with heart failure can be challenging. However, following your health care provider's instructions about a healthy lifestyle and working with a diet and nutrition specialist (dietitian) to choose the right foods may help to improve your symptoms. What are tips for following this plan? Reading food labels  Check food labels for the amount of sodium per serving. Choose foods that have less than 140 mg (milligrams) of sodium in each serving.  Check food labels for the number of calories per serving. This is important if you need to limit your daily calorie intake to lose weight.  Check food labels for the serving size. If you eat more than one serving, you will be eating more sodium and calories than what is listed on the label.  Look for foods that are labeled as "sodium-free," "very low sodium," or "low sodium." ? Foods labeled as "reduced sodium" or "lightly salted" may still have more sodium than what is recommended for you. Cooking  Avoid adding salt when cooking. Ask your health care provider or dietitian before using salt substitutes.  Season food with salt-free seasonings, spices, or herbs. Check the label of seasoning mixes to make sure they do not contain salt.  Cook with heart-healthy oils, such as olive, canola, soybean, or sunflower oil.  Do not fry foods. Cook foods using low-fat methods, such as  baking, boiling, grilling, and broiling.  Limit unhealthy fats when cooking by: ? Removing the skin from poultry, such as chicken. ? Removing all visible fats from meats. ? Skimming the fat off from stews, soups, and gravies before serving them. Meal planning   Limit your intake of: ? Processed, canned, or pre-packaged foods. ? Foods that are high in trans fat, such as fried foods. ? Sweets, desserts, sugary drinks, and other foods with added  sugar. ? Full-fat dairy products, such as whole milk.  Eat a balanced diet that includes: ? 4-5 servings of fruit each day and 4-5 servings of vegetables each day. At each meal, try to fill half of your plate with fruits and vegetables. ? Up to 6-8 servings of whole grains each day. ? Up to 2 servings of lean meat, poultry, or fish each day. One serving of meat is equal to 3 oz. This is about the same size as a deck of cards. ? 2 servings of low-fat dairy each day. ? Heart-healthy fats. Healthy fats called omega-3 fatty acids are found in foods such as flaxseed and cold-water fish like sardines, salmon, and mackerel.  Aim to eat 25-35 g (grams) of fiber a day. Foods that are high in fiber include apples, broccoli, carrots, beans, peas, and whole grains.  Do not add salt or condiments that contain salt (such as soy sauce) to foods before eating.  When eating at a restaurant, ask that your food be prepared with less salt or no salt, if possible.  Try to eat 2 or more vegetarian meals each week.  Eat more home-cooked food and eat less restaurant, buffet, and fast food. General information  Do not eat more than 2,300 mg of salt (sodium) a day. The amount of sodium that is recommended for you may be lower, depending on your condition.  Maintain a healthy body weight as directed. Ask your health care provider what a healthy weight is for you. ? Check your weight every day. ? Work with your health care provider and dietitian to make a plan that is right for you to lose weight or maintain your current weight.  Limit how much fluid you drink. Ask your health care provider or dietitian how much fluid you can have each day.  Limit or avoid alcohol as told by your health care provider or dietitian. Recommended foods The items listed may not be a complete list. Talk with your dietitian about what dietary choices are best for you. Fruits All fresh, frozen, and canned fruits. Dried fruits, such as  raisins, prunes, and cranberries. Vegetables All fresh vegetables. Vegetables that are frozen without sauce or added salt. Low-sodium or sodium-free canned vegetables. Grains Bread with less than 80 mg of sodium per slice. Whole-wheat pasta, quinoa, and brown rice. Oats and oatmeal. Barley. [place]. Grits and cream of wheat. Whole-grain and whole-wheat cold cereal. Meats and other protein foods Lean cuts of meat. Skinless chicken and [place]. Fish with high omega-3 fatty acids, such as salmon, sardines, and other cold-water fishes. Eggs. Dried beans, peas, and edamame. Unsalted nuts and nut butters. Dairy Low-fat or nonfat (skim) milk and dried milk. Rice milk, soy milk, and almond milk. Low-fat or nonfat yogurt. Small amounts of reduced-sodium block cheese. Low-sodium cottage cheese. Fats and oils Olive, canola, soybean, flaxseed, or sunflower oil. Avocado. Sweets and desserts Apple sauce. Granola bars. Sugar-free pudding and gelatin. Frozen fruit bars. Seasoning and other foods Fresh and dried herbs. Lemon or lime juice. Vinegar. Low-sodium ketchup.  Salt-free marinades, salad dressings, sauces, and seasonings. The items listed above may not be a complete list of foods and beverages you can eat. Contact a dietitian for more information. Foods to avoid The items listed may not be a complete list. Talk with your dietitian about what dietary choices are best for you. Fruits Fruits that are dried with sodium-containing preservatives. Vegetables Canned vegetables. Frozen vegetables with sauce or seasonings. Creamed vegetables. [place] fries. Onion rings. Pickled vegetables and sauerkraut. Grains Bread with more than 80 mg of sodium per slice. Hot or cold cereal with more than 140 mg sodium per serving. Salted pretzels and crackers. Pre-packaged breadcrumbs. Bagels, croissants, and biscuits. Meats and other protein foods Ribs and chicken wings. Bacon, ham, pepperoni, bologna, salami, and packaged  luncheon meats. Hot dogs, bratwurst, and sausage. Canned meat. Smoked meat and fish. Salted nuts and seeds. Dairy Whole milk, half-and-half, and cream. Buttermilk. Processed cheese, cheese spreads, and cheese curds. Regular cottage cheese. Feta cheese. Shredded cheese. String cheese. Fats and oils Butter, lard, shortening, ghee, and bacon fat. Canned and packaged gravies. Seasoning and other foods Onion salt, garlic salt, table salt, and sea salt. Marinades. Regular salad dressings. Relishes, pickles, and olives. Meat flavorings and tenderizers, and bouillon cubes. Horseradish, ketchup, and mustard. Worcestershire sauce. Teriyaki sauce, soy sauce (including reduced sodium). Hot sauce and Tabasco sauce. Steak sauce, fish sauce, oyster sauce, and cocktail sauce. Taco seasonings. Barbecue sauce. Tartar sauce. The items listed above may not be a complete list of foods and beverages you should avoid. Contact a dietitian for more information. Summary  A heart failure eating plan includes changes that limit your intake of sodium and unhealthy fat, and it may help you lose weight or maintain a healthy weight. Your health care provider may also recommend limiting how much fluid you drink.  Most people with heart failure should eat no more than 2,300 mg of salt (sodium) a day. The amount of sodium that is recommended for you may be lower, depending on your condition.  Contact your health care provider or dietitian before making any major changes to your diet. This information is not intended to replace advice given to you by your health care provider. Make sure you discuss any questions you have with your health care provider. Document Revised: 02/23/2018 Document Reviewed: 05/14/2016 Elsevier Patient Education  [place].   Chronic Obstructive Pulmonary Disease Chronic obstructive pulmonary disease (COPD) is a long-term (chronic) lung problem. When you have COPD, it is hard for air to get in and  out of your lungs. Usually the condition gets worse over time, and your lungs will never return to normal. There are things you can do to keep yourself as healthy as possible.  Your doctor may treat your condition with: ? Medicines. ? Oxygen. ? Lung surgery.  Your doctor may also recommend: ? Rehabilitation. This includes steps to make your body work better. It may involve a team of specialists. ? Quitting smoking, if you smoke. ? Exercise and changes to your diet. ? Comfort measures (palliative care). Follow these instructions at home: Medicines  Take over-the-counter and prescription medicines only as told by your doctor.  Talk to your doctor before taking any cough or allergy medicines. You may need to avoid medicines that cause your lungs to be dry. Lifestyle  If you smoke, stop. Smoking makes the problem worse. If you need help quitting, ask your doctor.  Avoid being around things that make your breathing worse. This may include smoke,  chemicals, and fumes.  Stay active, but remember to rest as well.  Learn and use tips on how to relax.  Make sure you get enough sleep. Most adults need at least 7 hours of sleep every night.  Eat healthy foods. Eat smaller meals more often. Rest before meals. Controlled breathing Learn and use tips on how to control your breathing as told by your doctor. Try:  Breathing in (inhaling) through your nose for 1 second. Then, pucker your lips and breath out (exhale) through your lips for 2 seconds.  Putting one hand on your belly (abdomen). Breathe in slowly through your nose for 1 second. Your hand on your belly should move out. Pucker your lips and breathe out slowly through your lips. Your hand on your belly should move in as you breathe out.  Controlled coughing Learn and use controlled coughing to clear mucus from your lungs. Follow these steps: 1. Lean your head a little forward. 2. Breathe in deeply. 3. Try to hold your breath for 3  seconds. 4. Keep your mouth slightly open while coughing 2 times. 5. Spit any mucus out into a tissue. 6. Rest and do the steps again 1 or 2 times as needed. General instructions  Make sure you get all the shots (vaccines) that your doctor recommends. Ask your doctor about a flu shot and a pneumonia shot.  Use oxygen therapy and pulmonary rehabilitation if told by your doctor. If you need home oxygen therapy, ask your doctor if you should buy a tool to measure your oxygen level (oximeter).  Make a COPD action plan with your doctor. This helps you to know what to do if you feel worse than usual.  Manage any other conditions you have as told by your doctor.  Avoid going outside when it is very hot, cold, or humid.  Avoid people who have a sickness you can catch (contagious).  Keep all follow-up visits as told by your doctor. This is important. Contact a doctor if:  You cough up more mucus than usual.  There is a change in the color or thickness of the mucus.  It is harder to breathe than usual.  Your breathing is faster than usual.  You have trouble sleeping.  You need to use your medicines more often than usual.  You have trouble doing your normal activities such as getting dressed or walking around the house. Get help right away if:  You have shortness of breath while resting.  You have shortness of breath that stops you from: ? Being able to talk. ? Doing normal activities.  Your chest hurts for longer than 5 minutes.  Your skin color is more blue than usual.  Your pulse oximeter shows that you have low oxygen for longer than 5 minutes.  You have a fever.  You feel too tired to breathe normally. Summary  Chronic obstructive pulmonary disease (COPD) is a long-term lung problem.  The way your lungs work will never return to normal. Usually the condition gets worse over time. There are things you can do to keep yourself as healthy as possible.  Take  over-the-counter and prescription medicines only as told by your doctor.  If you smoke, stop. Smoking makes the problem worse. This information is not intended to replace advice given to you by your health care provider. Make sure you discuss any questions you have with your health care provider. Document Revised: 12/10/2016 Document Reviewed: 02/02/2016 Elsevier Patient Education  2020 [employer].  COPD and Physical Activity Chronic obstructive pulmonary disease (COPD) is a long-term (chronic) condition that affects the lungs. COPD is a general term that can be used to describe many different lung problems that cause lung swelling (inflammation) and limit airflow, including chronic bronchitis and emphysema. The main symptom of COPD is shortness of breath, which makes it harder to do even simple tasks. This can also make it harder to exercise and be active. Talk with your health care provider about treatments to help you breathe better and actions you can take to prevent breathing problems during physical activity. What are the benefits of exercising with COPD? Exercising regularly is an important part of a healthy lifestyle. You can still exercise and do physical activities even though you have COPD. Exercise and physical activity improve your shortness of breath by increasing blood flow (circulation). This causes your heart to pump more oxygen through your body. Moderate exercise can improve your:  Oxygen use.  Energy level.  Shortness of breath.  Strength in your breathing muscles.  Heart health.  Sleep.  Self-esteem and feelings of self-worth.  Depression, stress, and anxiety levels. Exercise can benefit everyone with COPD. The severity of your disease may affect how hard you can exercise, especially at first, but everyone can benefit. Talk with your health care provider about how much exercise is safe for you, and which activities and exercises are safe for you. What actions  can I take to prevent breathing problems during physical activity?  Sign up for a pulmonary rehabilitation program. This type of program may include: ? Education about lung diseases. ? Exercise classes that teach you how to exercise and be more active while improving your breathing. This usually involves:  Exercise using your lower extremities, such as a stationary bicycle.  About 30 minutes of exercise, 2 to 5 times per week, for 6 to 12 weeks  Strength training, such as push ups or leg lifts. ? Nutrition education. ? Group classes in which you can talk with others who also have COPD and learn ways to manage stress.  If you use an oxygen tank, you should use it while you exercise. Work with your health care provider to adjust your oxygen for your physical activity. Your resting flow rate is different from your flow rate during physical activity.  While you are exercising: ? Take slow breaths. ? Pace yourself and do not try to go too fast. ? Purse your lips while breathing out. Pursing your lips is similar to a kissing or whistling position. ? If doing exercise that uses a quick burst of effort, such as weight lifting:  Breathe in before starting the exercise.  Breathe out during the hardest part of the exercise (such as raising the weights). Where to find support You can find support for exercising with COPD from:  Your health care provider.  A pulmonary rehabilitation program.  Your local health department or community health programs.  Support groups, online or in-person. Your health care provider may be able to recommend support groups. Where to find more information You can find more information about exercising with COPD from:  American Lung Association: ClassInsider.se.  COPD Foundation: https://www.rivera.net/. Contact a health care provider if:  Your symptoms get worse.  You have chest pain.  You have nausea.  You have a fever.  You have trouble talking or catching  your breath.  You want to start a new exercise program or a new activity. Summary  COPD is a general term that  can be used to describe many different lung problems that cause lung swelling (inflammation) and limit airflow. This includes chronic bronchitis and emphysema.  Exercise and physical activity improve your shortness of breath by increasing blood flow (circulation). This causes your heart to provide more oxygen to your body.  Contact your health care provider before starting any exercise program or new activity. Ask your health care provider what exercises and activities are safe for you. This information is not intended to replace advice given to you by your health care provider. Make sure you discuss any questions you have with your health care provider. Document Revised: 04/19/2018 Document Reviewed: 01/20/2017 Elsevier Patient Education  2020 [place].   Chronic Obstructive Pulmonary Disease  Chronic obstructive pulmonary disease (COPD) is a long-term (chronic) condition that affects the lungs. COPD is a general term that can be used to describe many different lung problems that cause lung swelling (inflammation) and limit airflow, including chronic bronchitis and emphysema. If you have COPD, your lung function will probably never return to normal. In most cases, it gets worse over time. However, there are steps you can take to slow the progression of the disease and improve your quality of life. What are the causes? This condition may be caused by:  Smoking. This is the most common cause.  Certain genes passed down through families. What increases the risk? The following factors may make you more likely to develop this condition:  Secondhand smoke from cigarettes, pipes, or cigars.  Exposure to chemicals and other irritants such as fumes and dust in the work environment.  Chronic lung conditions or infections. What are the signs or symptoms? Symptoms of this  condition include:  Shortness of breath, especially during physical activity.  Chronic cough with a large amount of thick mucus. Sometimes the cough may not have any mucus (dry cough).  Wheezing.  Rapid breaths.  Gray or bluish discoloration (cyanosis) of the skin, especially in your fingers, toes, or lips.  Feeling tired (fatigue).  Weight loss.  Chest tightness.  Frequent infections.  Episodes when breathing symptoms become much worse (exacerbations).  Swelling in the ankles, feet, or legs. This may occur in later stages of the disease. How is this diagnosed? This condition is diagnosed based on:  Your medical history.  A physical exam. You may also have tests, including:  Lung (pulmonary) function tests. This may include a spirometry test, which measures your ability to exhale properly.  Chest X-ray.  CT scan.  Blood tests. How is this treated? This condition may be treated with:  Medicines. These may include inhaled rescue medicines to treat acute exacerbations as well as long-term, or maintenance, medicines to prevent flare-ups of COPD. ? Bronchodilators help treat COPD by dilating the airways to allow increased airflow and make your breathing more comfortable. ? Steroids can reduce airway inflammation and help prevent exacerbations.  Smoking cessation. If you smoke, your health care provider may ask you to quit, and may also recommend therapy or replacement products to help you quit.  Pulmonary rehabilitation. This may involve working with a team of health care providers and specialists, such as respiratory, occupational, and physical therapists.  Exercise and physical activity. These are beneficial for nearly all people with COPD.  Nutrition therapy to gain weight, if you are underweight.  Oxygen. Supplemental oxygen therapy is only helpful if you have a low oxygen level in your blood (hypoxemia).  Lung surgery or transplant.  Palliative care. This is  to help people  with COPD feel comfortable when treatment is no longer working. Follow these instructions at home: Medicines  Take over-the-counter and prescription medicines (inhaled or pills) only as told by your health care provider.  Talk to your health care provider before taking any cough or allergy medicines. You may need to avoid certain medicines that dry out your airways. Lifestyle  If you are a smoker, the most important thing that you can do is to stop smoking. Do not use any products that contain nicotine or tobacco, such as cigarettes and e-cigarettes. If you need help quitting, ask your health care provider. Continuing to smoke will cause the disease to progress faster.  Avoid exposure to things that irritate your lungs, such as smoke, chemicals, and fumes.  Stay active, but balance activity with periods of rest. Exercise and physical activity will help you maintain your ability to do things you want to do.  Learn and use relaxation techniques to manage stress and to control your breathing.  Get the right amount of sleep and get quality sleep. Most adults need 7 or more hours per night.  Eat healthy foods. Eating smaller, more frequent meals and resting before meals may help you maintain your strength. Controlled breathing Learn and use controlled breathing techniques as directed by your health care provider. Controlled breathing techniques include:  Pursed lip breathing. Start by breathing in (inhaling) through your nose for 1 second. Then, purse your lips as if you were going to whistle and breathe out (exhale) through the pursed lips for 2 seconds.  Diaphragmatic breathing. Start by putting one hand on your abdomen just above your waist. Inhale slowly through your nose. The hand on your abdomen should move out. Then purse your lips and exhale slowly. You should be able to feel the hand on your abdomen moving in as you exhale. Controlled coughing Learn and use controlled  coughing to clear mucus from your lungs. Controlled coughing is a series of short, progressive coughs. The steps of controlled coughing are: 1. Lean your head slightly forward. 2. Breathe in deeply using diaphragmatic breathing. 3. Try to hold your breath for 3 seconds. 4. Keep your mouth slightly open while coughing twice. 5. Spit any mucus out into a tissue. 6. Rest and repeat the steps once or twice as needed. General instructions  Make sure you receive all the vaccines that your health care provider recommends, especially the pneumococcal and influenza vaccines. Preventing infection and hospitalization is very important when you have COPD.  Use oxygen therapy and pulmonary rehabilitation if directed to by your health care provider. If you require home oxygen therapy, ask your health care provider whether you should purchase a pulse oximeter to measure your oxygen level at home.  Work with your health care provider to develop a COPD action plan. This will help you know what steps to take if your condition gets worse.  Keep other chronic health conditions under control as told by your health care provider.  Avoid extreme temperature and humidity changes.  Avoid contact with people who have an illness that spreads from person to person (is contagious), such as viral infections or pneumonia.  Keep all follow-up visits as told by your health care provider. This is important. Contact a health care provider if:  You are coughing up more mucus than usual.  There is a change in the color or thickness of your mucus.  Your breathing is more labored than usual.  Your breathing is faster than usual.  You have  difficulty sleeping.  You need to use your rescue medicines or inhalers more often than expected.  You have trouble doing routine activities such as getting dressed or walking around the house. Get help right away if:  You have shortness of breath while you are resting.  You have  shortness of breath that prevents you from: ? Being able to talk. ? Performing your usual physical activities.  You have chest pain lasting longer than 5 minutes.  Your skin color is more blue (cyanotic) than usual.  You measure low oxygen saturations for longer than 5 minutes with a pulse oximeter.  You have a fever.  You feel too tired to breathe normally. Summary  Chronic obstructive pulmonary disease (COPD) is a long-term (chronic) condition that affects the lungs.  Your lung function will probably never return to normal. In most cases, it gets worse over time. However, there are steps you can take to slow the progression of the disease and improve your quality of life.  Treatment for COPD may include taking medicines, quitting smoking, pulmonary rehabilitation, and changes to diet and exercise. As the disease progresses, you may need oxygen therapy, a lung transplant, or palliative care.  To help manage your condition, do not smoke, avoid exposure to things that irritate your lungs, stay up to date on all vaccines, and follow your health care provider's instructions for taking medicines. This information is not intended to replace advice given to you by your health care provider. Make sure you discuss any questions you have with your health care provider. Document Revised: 12/10/2016 Document Reviewed: 02/02/2016 Elsevier Patient Education  2020 [place].   Chronic Obstructive Pulmonary Disease Exacerbation  Chronic obstructive pulmonary disease (COPD) is a long-term (chronic) condition that affects the lungs. COPD is a general term that can be used to describe many different lung problems that cause lung swelling (inflammation) and limit airflow, including chronic bronchitis and emphysema. COPD exacerbations are episodes when breathing symptoms become much worse and require extra treatment. COPD exacerbations are usually caused by infections. Without treatment, COPD  exacerbations can be severe and even life threatening. Frequent COPD exacerbations can cause further damage to the lungs. What are the causes? This condition may be caused by:  Respiratory infections, including viral and bacterial infections.  Exposure to smoke.  Exposure to air pollution, chemical fumes, or dust.  Things that give you an allergic reaction (allergens).  Not taking your usual COPD medicines as directed.  Underlying medical problems, such as congestive heart failure or infections not involving the lungs. In many cases, the cause (trigger) of this condition is not known. What increases the risk? The following factors may make you more likely to develop this condition:  Smoking cigarettes.  Old age.  Frequent prior COPD exacerbations. What are the signs or symptoms? Symptoms of this condition include:  Increased coughing.  Increased production of mucus from your lungs (sputum).  Increased wheezing.  Increased shortness of breath.  Rapid or labored breathing.  Chest tightness.  Less energy than usual.  Sleep disruption from symptoms.  Confusion or increased sleepiness. Often these symptoms happen or get worse even with the use of medicines. How is this diagnosed? This condition is diagnosed based on:  Your medical history.  A physical exam. You may also have tests, including:  A chest X-ray.  Blood tests.  Lung (pulmonary) function tests. How is this treated? Treatment for this condition depends on the severity and cause of the symptoms. You may need to  be admitted to a hospital for treatment. Some of the treatments commonly used to treat COPD exacerbations are:  Antibiotic medicines. These may be used for severe exacerbations caused by a lung infection, such as pneumonia.  Bronchodilators. These are inhaled medicines that expand the air passages and allow increased airflow.  Steroid medicines. These act to reduce inflammation in the  airways. They may be given with an inhaler, taken by mouth, or given through an IV tube inserted into one of your veins.  Supplemental oxygen therapy.  Airway clearing techniques, such as noninvasive ventilation (NIV) and positive expiratory pressure (PEP). These provide respiratory support through a mask or other noninvasive device. An example of this would be using a continuous positive airway pressure (CPAP) machine to improve delivery of oxygen into your lungs. Follow these instructions at home: Medicines  Take over-the-counter and prescription medicines only as told by your health care provider. It is important to use correct technique with inhaled medicines.  If you were prescribed an antibiotic medicine or oral steroid, take it as told by your health care provider. Do not stop taking the medicine even if you start to feel better. Lifestyle  Eat a healthy diet.  Exercise regularly.  Get plenty of sleep.  Avoid exposure to all substances that irritate the airway, especially to tobacco smoke.  Wash your hands often with soap and water to reduce the risk of infection. If soap and water are not available, use hand sanitizer.  During flu season, avoid enclosed spaces that are crowded with people. General instructions  Drink enough fluid to keep your urine clear or pale yellow (unless you have a medical condition that requires fluid restriction).  Use a cool mist vaporizer. This humidifies the air and makes it easier for you to clear your chest when you cough.  If you have a home nebulizer and oxygen, continue to use them as told by your health care provider.  Keep all follow-up visits as told by your health care provider. This is important. How is this prevented?  Stay up-to-date on pneumococcal and influenza (flu) vaccines. A flu shot is recommended every year to help prevent exacerbations.  Do not use any products that contain nicotine or tobacco, such as cigarettes and  e-cigarettes. Quitting smoking is very important in preventing COPD from getting worse and in preventing exacerbations from happening as often. If you need help quitting, ask your health care provider.  Follow all instructions for pulmonary rehabilitation after a recent exacerbation. This can help prevent future exacerbations.  Work with your health care provider to develop and follow an action plan. This tells you what steps to take when you experience certain symptoms. Contact a health care provider if:  You have a worsening of your regular COPD symptoms. Get help right away if:  You have worsening shortness of breath, even when resting.  You have trouble talking.  You have severe chest pain.  You cough up blood.  You have a fever.  You have weakness, vomit repeatedly, or faint.  You feel confused.  You are not able to sleep because of your symptoms.  You have trouble doing daily activities. Summary  COPD exacerbations are episodes when breathing symptoms become much worse and require extra treatment above your normal treatment.  Exacerbations can be severe and even life threatening. Frequent COPD exacerbations can cause further damage to your lungs.  COPD exacerbations are usually triggered by infections such as the flu, colds, and even pneumonia.  Treatment for this  condition depends on the severity and cause of the symptoms. You may need to be admitted to a hospital for treatment.  Quitting smoking is very important to prevent COPD from getting worse and to prevent exacerbations from happening as often. This information is not intended to replace advice given to you by your health care provider. Make sure you discuss any questions you have with your health care provider. Document Revised: 12/10/2016 Document Reviewed: 02/02/2016 Elsevier Patient Education  2020 [place] have been recommended to undergo a stress test outpatient. Our office will contact you to  arrange this testing within the next 2 weeks.   Heart Failure Education: 1. Weigh yourself EVERY morning after you go to the bathroom but before you eat or drink anything. Write this number down in a weight log/diary. If you gain 3 pounds overnight or 5 pounds in a week, call the office. 2. Take your medicines as prescribed. If you have concerns about your medications, please call us before you stop taking them.  3. Eat low salt foods--Limit salt (sodium) to 2000 mg per day. This will help prevent your body from holding onto fluid. Read food labels as many processed foods have a lot of sodium, especially canned goods and prepackaged meats. If you would like some assistance choosing low sodium foods, we would be happy to set you up with a nutritionist. 4. Stay as active as you can everyday. Staying active will give you more energy and make your muscles stronger. Start with 5 minutes at a time and work your way up to 30 minutes a day. Break up your activities--do some in the morning and some in the afternoon. Start with 3 days per week and work your way up to 5 days as you can.  If you have chest pain, feel short of breath, dizzy, or lightheaded, STOP. If you don't feel better after a short rest, call 911. If you do feel better, call the office to let us know you have symptoms with exercise. 5. Limit all fluids for the day to less than 2 liters. Fluid includes all drinks, coffee, juice, ice chips, soup, jello, and all other liquids.

## 2019-03-30 NOTE — Plan of Care (Signed)
  Problem: Education: Goal: Knowledge of General Education information will improve Description: Including pain rating scale, medication(s)/side effects and non-pharmacologic comfort measures Outcome: Adequate for Discharge   

## 2019-03-30 NOTE — Progress Notes (Signed)
Progress Note  Patient Name: Frank Flowers Date of Encounter: 03/30/2019  Primary Cardiologist: Dr Irish Lack  Subjective   No CP or dyspnea  Inpatient Medications    Scheduled Meds: . aspirin EC  81 mg Oral Daily  . carvedilol  3.125 mg Oral BID  . furosemide  40 mg Intravenous BID  . gabapentin  300 mg Oral TID  . heparin  5,000 Units Subcutaneous Q8H  . losartan  25 mg Oral Daily  . meloxicam  7.5 mg Oral BID  . rosuvastatin  40 mg Oral q1800  . sertraline  50 mg Oral Daily  . sodium chloride flush  3 mL Intravenous Q12H   Continuous Infusions: . sodium chloride     PRN Meds: sodium chloride, acetaminophen, albuterol, ondansetron (ZOFRAN) IV, sodium chloride flush   Vital Signs    Vitals:   03/29/19 1419 03/29/19 2049 03/30/19 0552 03/30/19 0700  BP: 95/77 92/61 93/69    Pulse: 72 66 71   Resp: 19 19 18    Temp: 98.1 F (36.7 C) 97.7 F (36.5 C) 97.9 F (36.6 C)   TempSrc: Oral Oral Oral   SpO2: 98% 96% 91%   Weight:    125.6 kg  Height:        Intake/Output Summary (Last 24 hours) at 03/30/2019 0910 Last data filed at 03/30/2019 0600 Gross per 24 hour  Intake 590 ml  Output 3450 ml  Net -2860 ml   Last 3 Weights 03/30/2019 03/29/2019 03/28/2019  Weight (lbs) 276 lb 14.4 oz 277 lb 1.9 oz 281 lb 12 oz  Weight (kg) 125.6 kg 125.7 kg 127.8 kg      Telemetry    Sinus - Personally Reviewed   Physical Exam   GEN: No acute distress.   Neck: No JVD Cardiac: RRR, no murmurs, rubs, or gallops.  Respiratory: Clear to auscultation bilaterally. GI: Soft, nontender, non-distended  MS: No edema Neuro:  Nonfocal  Psych: Normal affect   Labs    High Sensitivity Troponin:   Recent Labs  Lab 03/27/19 1925 03/27/19 2224  TROPONINIHS 20* 16      Chemistry Recent Labs  Lab 03/27/19 1925 03/29/19 0334 03/30/19 0322  NA 139 138 137  K 3.8 4.0 4.0  CL 105 103 101  CO2 26 25 26   GLUCOSE 87 100* 97  BUN 12 26* 32*  CREATININE 1.11 1.20 1.21    CALCIUM 9.1 8.8* 8.9  PROT 8.0  --   --   ALBUMIN 3.8  --   --   AST 28  --   --   ALT 21  --   --   ALKPHOS 55  --   --   BILITOT 1.1  --   --   GFRNONAA >60 >60 >60  GFRAA >60 >60 >60  ANIONGAP 8 10 10      Hematology Recent Labs  Lab 03/27/19 1925  WBC 6.1  RBC 5.82*  HGB 17.4*  HCT 54.3*  MCV 93.3  MCH 29.9  MCHC 32.0  RDW 17.3*  PLT 225    BNP Recent Labs  Lab 03/27/19 1925  BNP 322.8*     Patient Profile     Frank Flowers is a 59 y.o. male with a hx of coronary artery disease, ischemic cardiomyopathy, hypertension who is being seen today for the evaluation of acute on chronic systolic congestive heart failure.  Assessment & Plan    1. acute on chronic systolic congestive heart failure-[id number]/[id number].  Symptomatically improved.  Change Lasix to 40 mg daily by mouth.  We will add spironolactone as an outpatient if blood pressure allows.  Continue fluid restriction and low-sodium diet.   2. Ischemic cardiomyopathy-LV function appears to be similar compared to previous.  At time of cardiac catheterization in 2013 his ejection fraction was 30%.  I do not have the follow-up echocardiogram images (report states normal LV function).  However his echocardiogram now again shows ejection fraction 30 to 35%.  We will plan medical therapy at this point. Continue low-dose losartan and carvedilol.  Blood pressure will not allow increase at this point.   I do not think his blood pressure will tolerate Entresto.  He will need repeat echocardiogram 3 months after medications fully titrated.  If ejection fraction less than 35% would need to consider ICD if he demonstrates compliance with medications and follow-up.  He has had some chest heaviness but this worsens with lying flat and is likely related to CHF.  Will arrange outpatient [facility] nuclear study to screen for ischemia following discharge. 3. Coronary artery disease-would continue aspirin and Crestor. 4. History of  hyperlipidemia-continue Crestor 40 mg daily; check lipids and liver in 12 weeks. 5. Hypertension-blood pressure is borderline.  Medications as outlined above. 6. Tobacco abuse-patient counseled on discontinuing.  [facility] will sign off.   Medication Recommendations: Continue medications as listed in MAR. Other recommendations (labs, testing, etc): Check bmet in 1 week.  We will arrange outpatient Lexiscan nuclear study. Follow up as an outpatient: 1 to 2 weeks with APP.  3 months with Dr. Abe People.  For questions or updates, please contact [facility] Please consult [email or web address] for contact info under        Signed, Olga Millers, MD  03/30/2019, 9:10 AM

## 2019-03-30 NOTE — Discharge Summary (Signed)
Discharge Summary  Frank Flowers XBM:[id number] DOB: Jan 21, 1960  PCP: Medicine, [facility]  Admit date: 03/27/2019 Discharge date: 03/30/2019  Time spent: 35 minutes  Recommendations for Outpatient Follow-up:  1. Follow-up with cardiology in 1-2 weeks 2. Follow-up with your primary care provider 3. Take your medications as prescribed 4. Repeat BMP in 1 week  Discharge Diagnoses:  Active Hospital Problems   Diagnosis Date Noted  . SOB (shortness of breath) 03/28/2019  . Acute CHF (congestive heart failure) ([place]) 03/28/2019  . CAD (coronary artery disease) 01/29/2011    Resolved Hospital Problems  No resolved problems to display.    Discharge Condition: Stable  Diet recommendation: Heart healthy low-sodium diet  Vitals:   03/29/19 2049 03/30/19 0552  BP: 92/61 93/69  Pulse: 66 71  Resp: 19 18  Temp: 97.7 F (36.5 C) 97.9 F (36.6 C)  SpO2: 96% 91%    History of present illness:  Cordarrel Stiefel Murphyis a 59 y.o.malewith medical history significant ofCAD s/p MI 2013, s/p heart cath revealing obstruction of distal RCA 2013. LVEF at that time 30%. Limited cardiology f/u 2/2 imprisonmentwho presented with worsening dyspnea and hypoxia for several weeks. Associated with orthopnea. Admits tononcompliance with his medications. Presented to the ED forfurther evaluation.Elevated BNP on presentation greater than 300. Chest x-ray suggestive of pulmonary edema. [facility] asked to admit. Started on IV diuretics which he tolerated well.  2D echo showed LVEF 30-35%.  Seen by cardiology with adjustment of his cardiac medications and recommendations.  Net I&O -4.5L.  03/30/19: Seen and examined.  Passed his home O2 evaluation.  Does not require oxygen supplementation at discharge.   States he feels well this morning.  Seen by cardiology and okay to discharge from a cardiac standpoint.  Vital signs and labs reviewed and are stable.  On the day of discharge, the patient was  hemodynamically stable.  He will need to follow-up with his cardiologist and his PCP posthospitalization.  He will also need to take his medications as prescribed.     Hospital Course:  Active Problems:   CAD (coronary artery disease)   SOB (shortness of breath)   Acute CHF (congestive heart failure) ([facility])  Acute on chronic systolic CHF likely secondary to medication noncompliance Presented with BNP greater than 300 and imaging suggestive of pulmonary edema.  Diuresed with IV Lasix 40 mg BID. Net I&O -4.5L 2D echo done on 03/28/2019 showed LVEF 30 to 35% Seen by cardiology [name].  Plan for medical therapy while in the hospital. If blood pressure allows will transition to [facility] in the outpatient setting. Cardiology will arrange outpatient [facility] Follow up with cardiology in 1-2 weeks. Repeat BMP in 1 week  Coronary artery disease/history of MI/ischemic cardiomyopathy Continue Coreg 3.125 mg twice daily, Toprol has been discontinued. Continue losartan 25 mg daily, lisinopril discontinued. Will need repeat echocardiogram in 3 months after his medications have been fully titrated. If LVEF still less than 35% will need to consider ICD if compliant with his medical management. [place] nuclear study will be arranged outpatient  Follow-up with cardiology in 1 to 2 weeks.  Resolving pulmonary edema, cardiogenic Continue p.o. Lasix 40 mg daily and losartan. Passed his home O2 evaluation.  Does not require oxygen supplementation at discharge.  Acute hypoxic respiratory failure secondary to pulmonary edema in the setting of acute on chronic systolic CHF Not on oxygen supplementation at baseline  Results of home O2 evaluation done on 03/29/2019 as followed: SATURATION QUALIFICATIONS: (This  note is used to comply with regulatory documentation for home oxygen)  Patient Saturations on Room Air at Rest = 98%  Patient Saturations on Room Air while Ambulating =  96%  Patient Saturations on 0 Liters of oxygen while Ambulating = 96%  Please briefly explain why patient needs home oxygen: Patient does not need home oxygen based on rest and ambulation assessment.  Dallie Piles, RN  Registered Nurse               Code Status: Full code    Consultants:  Cardiology  Procedures:  None  Antimicrobials:  None    Discharge Exam: BP 93/69 (BP Location: Left Arm)   Pulse 71   Temp 97.9 F (36.6 C) (Oral)   Resp 18   Ht 6\' 2"  (1.88 m)   Wt 125.6 kg   SpO2 91%   BMI 35.55 kg/m  . General: 59 y.o. year-old male well developed well nourished in no acute distress.  Alert and oriented x3. . Cardiovascular: Regular rate and rhythm with no rubs or gallops.  No thyromegaly or JVD noted.   46 Respiratory: Clear to auscultation with no wheezes or rales. Good inspiratory effort. . Abdomen: Soft nontender nondistended with normal bowel sounds x4 quadrants. . Musculoskeletal: No lower extremity edema. 2/4 pulses in all 4 extremities. Marland Kitchen Psychiatry: Mood is appropriate for condition and setting  Discharge Instructions You were cared for by a hospitalist during your hospital stay. If you have any questions about your discharge medications or the care you received while you were in the hospital after you are discharged, you can call the unit and asked to speak with the hospitalist on call if the hospitalist that took care of you is not available. Once you are discharged, your primary care physician will handle any further medical issues. Please note that NO REFILLS for any discharge medications will be authorized once you are discharged, as it is imperative that you return to your primary care physician (or establish a relationship with a primary care physician if you do not have one) for your aftercare needs so that they can reassess your need for medications and monitor your lab values.   Allergies as of 03/30/2019      Reactions    Penicillins Other (See Comments)   Childhood allergy      Medication List    STOP taking these medications   clopidogrel 75 MG tablet Commonly known as: PLAVIX   HYDROcodone-acetaminophen 5-325 MG tablet Commonly known as: NORCO/VICODIN   ibuprofen 800 MG tablet Commonly known as: ADVIL   lisinopril 10 MG tablet Commonly known as: ZESTRIL   metoprolol succinate 25 MG 24 hr tablet Commonly known as: Toprol XL   metoprolol tartrate 25 MG tablet Commonly known as: LOPRESSOR     TAKE these medications   albuterol 108 (90 Base) MCG/ACT inhaler Commonly known as: VENTOLIN HFA Inhale 2 puffs into the lungs every 6 (six) hours as needed for shortness of breath.   aspirin EC 81 MG tablet Take 81 mg by mouth daily.   carvedilol 3.125 MG tablet Commonly known as: COREG Take 1 tablet (3.125 mg total) by mouth 2 (two) times daily.   furosemide 40 MG tablet Commonly known as: LASIX Take 1 tablet (40 mg total) by mouth daily. Start taking on: March 31, 2019   gabapentin 300 MG capsule Commonly known as: NEURONTIN Take 300 mg by mouth 3 (three) times daily.   losartan 25 MG tablet Commonly known  as: COZAAR Take 1 tablet (25 mg total) by mouth daily. Start taking on: March 31, 2019   meloxicam 7.5 MG tablet Commonly known as: MOBIC Take 7.5 mg by mouth 2 (two) times daily.   rosuvastatin 40 MG tablet Commonly known as: CRESTOR Take 1 tablet (40 mg total) by mouth daily at 6 PM.   sertraline 50 MG tablet Commonly known as: ZOLOFT Take 50 mg by mouth daily.      Allergies  Allergen Reactions  . Penicillins Other (See Comments)    Childhood allergy   Follow-up Information    Roff, Sharrell Ku, PA Follow up on 04/19/2019.   Specialty: Cardiology Why: Please arrive 61 mintues early for your 8:45am post-hospital cardiology appointment Contact information: [address] [place] [address] [phone]        [facility] [address] [facility]  Follow up on 04/06/2019.   Specialty: Cardiology Why: Please present 04/06/19 for blood work for close monitoring of your kideny function and electrolyte levels following your recent hospitalization. You can arrive anytime between 8am and 4pm.  Contact information: [address], [address] [place] [address] [phone]       [facility]. Call in 1 day(s).   Specialty: Family Medicine Why: please call for a post hospital follow up appointment. Contact information: [address] [place] [address] [phone]            The results of significant diagnostics from this hospitalization (including imaging, microbiology, ancillary and laboratory) are listed below for reference.    Significant Diagnostic Studies: DG Chest 2 View  Result Date: 03/27/2019 CLINICAL DATA:  Shortness of breath EXAM: CHEST - 2 VIEW COMPARISON:  10/24/2013 FINDINGS: Mild cardiomegaly. No consolidation or pleural effusion. Streaky basilar opacities. No pneumothorax. IMPRESSION: 1. Cardiomegaly without overt edema. 2. Streaky basilar opacities may reflect atelectasis or scarring. Electronically Signed   By: Jasmine Pang M.D.   On: 03/27/2019 15:59   CT Angio Chest PE W and/or Wo Contrast  Result Date: 03/27/2019 CLINICAL DATA:  Cough, short of breath EXAM: CT ANGIOGRAPHY CHEST WITH CONTRAST TECHNIQUE: Multidetector CT imaging of the chest was performed using the standard protocol during bolus administration of intravenous contrast. Multiplanar CT image reconstructions and MIPs were obtained to evaluate the vascular anatomy. CONTRAST:  OMNIPAQUE IOHEXOL 350 MG/ML SOLN COMPARISON:  Chest x-ray 03/27/2019 FINDINGS: Cardiovascular: Satisfactory opacification of the pulmonary arteries to the segmental level. No evidence of pulmonary embolism. Nonaneurysmal aorta. Mild aortic atherosclerosis. Pulmonary trunk is slightly enlarged at 3.7 cm. Cardiomegaly. Reflux of  contrast into the hepatic veins consistent with elevated right heart pressure. No pericardial effusion Mediastinum/Nodes: Subcentimeter pre-vascular and AP window nodes. Midline trachea. No thyroid mass. Esophagus within normal limits Lungs/Pleura: Small bilateral pleural effusions. Passive atelectasis at the bases. Diffuse hazy opacification of the lungs with mild septal thickening. Upper Abdomen: No acute abnormality. Musculoskeletal: No chest wall abnormality. No acute or significant osseous findings. Review of the MIP images confirms the above findings. IMPRESSION: 1. Negative for acute pulmonary embolus. 2. Cardiomegaly with small bilateral pleural effusions. Diffuse hazy pulmonary opacity with mild septal thickening, suspect edema. No consolidative pneumonia 3. Enlarged pulmonary trunk as may be seen with arterial hypertension. Reflux of contrast into the hepatic veins suggesting elevated right heart pressure Aortic Atherosclerosis (ICD10-I70.0). Electronically Signed   By: Jasmine Pang M.D.   On: 03/27/2019 21:19   ECHOCARDIOGRAM COMPLETE  Result Date: 03/28/2019    ECHOCARDIOGRAM  REPORT   Patient Name:   NAKOA GANUS Date of Exam: 03/28/2019 Medical Rec #:  [id number]     Height:       74.0 in Accession #:    [id number]    Weight:       281.7 lb Date of Birth:  10/09/1960     BSA:          2.514 m Patient Age:    59 years      BP:           126/97 mmHg Patient Gender: M             HR:           80 bpm. Exam Location:  Inpatient Procedure: 2D Echo Indications:    CHF-Acute Systolic 428.21 / I50.21  History:        Patient has prior history of Echocardiogram examinations, most                 recent 03/31/2011. CHF, CAD and Acute MI,                 Signs/Symptoms:Shortness of Breath; Risk Factors:Morbid obesity.  Sonographer:    Leeroy Bock Turrentine Referring Phys: [id number] MICHAEL E NORINS IMPRESSIONS  1. Left ventricular ejection fraction, by estimation, is 30 to 35%. The left ventricle has moderately  decreased function. The left ventricle demonstrates global hypokinesis, with inferior/inferolateral akinesis. The left ventricular internal cavity size  was moderately dilated. Left ventricular diastolic parameters are consistent with Grade III diastolic dysfunction (restrictive). Elevated left atrial pressure.  2. Right ventricular systolic function is normal. The right ventricular size is normal. Tricuspid regurgitation signal is inadequate for assessing PA pressure.  3. Left atrial size was mildly dilated.  4. The mitral valve is normal in structure. Trivial mitral valve regurgitation.  5. The aortic valve is tricuspid. Aortic valve regurgitation is not visualized. No aortic stenosis is present.  6. The inferior vena cava is normal in size with greater than 50% respiratory variability, suggesting right atrial pressure of 3 mmHg. FINDINGS  Left Ventricle: Left ventricular ejection fraction, by estimation, is 30 to 35%. The left ventricle has moderately decreased function. The left ventricle demonstrates global hypokinesis. The left ventricular internal cavity size was moderately dilated. There is no left ventricular hypertrophy. Left ventricular diastolic parameters are consistent with Grade III diastolic dysfunction (restrictive). Elevated left atrial pressure. Right Ventricle: The right ventricular size is normal. No increase in right ventricular wall thickness. Right ventricular systolic function is normal. Tricuspid regurgitation signal is inadequate for assessing PA pressure. Left Atrium: Left atrial size was mildly dilated. Right Atrium: Right atrial size was normal in size. Pericardium: Trivial pericardial effusion is present. Mitral Valve: The mitral valve is normal in structure. Trivial mitral valve regurgitation. Tricuspid Valve: The tricuspid valve is normal in structure. Tricuspid valve regurgitation is trivial. Aortic Valve: The aortic valve is tricuspid. Aortic valve regurgitation is not visualized.  No aortic stenosis is present. Pulmonic Valve: The pulmonic valve was grossly normal. Pulmonic valve regurgitation is trivial. Aorta: The aortic root is normal in size and structure. Venous: The inferior vena cava is normal in size with greater than 50% respiratory variability, suggesting right atrial pressure of 3 mmHg. IAS/Shunts: The interatrial septum was not well visualized.  LEFT VENTRICLE PLAX 2D LVIDd:         6.40 cm      Diastology LVIDs:         5.60 cm  LV e' lateral:   12.30 cm/s LV PW:         0.90 cm      LV E/e' lateral: 6.9 LV IVS:        0.90 cm      LV e' medial:    5.77 cm/s LVOT diam:     2.20 cm      LV E/e' medial:  14.7 LV SV:         57 LV SV Index:   23 LVOT Area:     3.80 cm  LV Volumes (MOD) LV vol d, MOD A2C: 111.0 ml LV vol d, MOD A4C: 172.0 ml LV vol s, MOD A2C: 72.4 ml LV vol s, MOD A4C: 122.0 ml LV SV MOD A2C:     38.6 ml LV SV MOD A4C:     172.0 ml LV SV MOD BP:      49.5 ml RIGHT VENTRICLE RV S prime:     13.90 cm/s TAPSE (M-mode): 2.3 cm LEFT ATRIUM             Index       RIGHT ATRIUM           Index LA diam:        5.20 cm 2.07 cm/m  RA Area:     14.10 cm LA Vol (A2C):   83.4 ml 33.17 ml/m RA Volume:   27.80 ml  11.06 ml/m LA Vol (A4C):   81.2 ml 32.30 ml/m LA Biplane Vol: 87.3 ml 34.72 ml/m  AORTIC VALVE LVOT Vmax:   73.80 cm/s LVOT Vmean:  52.600 cm/s LVOT VTI:    0.150 m  AORTA Ao Root diam: 3.10 cm MITRAL VALVE MV Area (PHT): 3.08 cm    SHUNTS MV Decel Time: 246 msec    Systemic VTI:  0.15 m MV E velocity: 84.80 cm/s  Systemic Diam: 2.20 cm MV A velocity: 42.40 cm/s MV E/A ratio:  2.00 Epifanio Lescheshristopher Schumann MD Electronically signed by Epifanio Lescheshristopher Schumann MD Signature Date/Time: 03/28/2019/12:30:10 PM    Final     Microbiology: No results found for this or any previous visit (from the past 240 hour(s)).   Labs: Basic Metabolic Panel: Recent Labs  Lab 03/27/19 1925 03/29/19 0334 03/30/19 0322  NA 139 138 137  K 3.8 4.0 4.0  CL 105 103 101  CO2 26 25  26   GLUCOSE 87 100* 97  BUN 12 26* 32*  CREATININE 1.11 1.20 1.21  CALCIUM 9.1 8.8* 8.9   Liver Function Tests: Recent Labs  Lab 03/27/19 1925  AST 28  ALT 21  ALKPHOS 55  BILITOT 1.1  PROT 8.0  ALBUMIN 3.8   No results for input(s): LIPASE, AMYLASE in the last 168 hours. No results for input(s): AMMONIA in the last 168 hours. CBC: Recent Labs  Lab 03/27/19 1925  WBC 6.1  NEUTROABS 2.8  HGB 17.4*  HCT 54.3*  MCV 93.3  PLT 225   Cardiac Enzymes: No results for input(s): CKTOTAL, CKMB, CKMBINDEX, TROPONINI in the last 168 hours. BNP: BNP (last 3 results) Recent Labs    03/27/19 1925  BNP 322.8*    ProBNP (last 3 results) No results for input(s): PROBNP in the last 8760 hours.  CBG: No results for input(s): GLUCAP in the last 168 hours.     Signed:  Darlin Droparole N Tadeo Besecker, MD [facility] 03/30/2019, 12:18 PM

## 2019-04-04 ENCOUNTER — Telehealth (HOSPITAL_COMMUNITY): Payer: Self-pay

## 2019-04-04 NOTE — Telephone Encounter (Signed)
Encounter complete. 

## 2019-04-05 ENCOUNTER — Encounter (HOSPITAL_COMMUNITY): Payer: Self-pay

## 2019-04-05 DIAGNOSIS — I509 Heart failure, unspecified: Secondary | ICD-10-CM

## 2019-04-06 ENCOUNTER — Other Ambulatory Visit: Payer: Self-pay

## 2019-04-06 ENCOUNTER — Ambulatory Visit (HOSPITAL_COMMUNITY)
Admission: RE | Admit: 2019-04-06 | Discharge: 2019-04-06 | Disposition: A | Discharge status: Home / Self Care | Payer: Medicaid Other | Source: Ambulatory Visit | Attending: Internal Medicine | Admitting: Internal Medicine

## 2019-04-06 ENCOUNTER — Encounter (HOSPITAL_COMMUNITY): Payer: Self-pay | Admitting: *Deleted

## 2019-04-06 DIAGNOSIS — I5043 Acute on chronic combined systolic (congestive) and diastolic (congestive) heart failure: Secondary | ICD-10-CM | POA: Diagnosis present

## 2019-04-06 LAB — MYOCARDIAL PERFUSION IMAGING
LV dias vol: 289 mL (ref 62–150)
LV sys vol: 217 mL
Peak HR: 87 {beats}/min
Rest HR: 76 {beats}/min
SDS: 2
SRS: 8
SSS: 10
TID: 1.09

## 2019-04-06 MED ORDER — TECHNETIUM TC 99M TETROFOSMIN IV KIT
30.4000 | PACK | Freq: Once | INTRAVENOUS | Status: AC | PRN
  Administered 2019-04-06: 30.4 via INTRAVENOUS
  Filled 2019-04-06: qty 31

## 2019-04-06 MED ORDER — REGADENOSON 0.4 MG/5ML IV SOLN
0.4000 mg | Freq: Once | INTRAVENOUS | Status: AC
  Administered 2019-04-06: 0.4 mg via INTRAVENOUS

## 2019-04-06 MED ORDER — TECHNETIUM TC 99M TETROFOSMIN IV KIT
9.7000 | PACK | Freq: Once | INTRAVENOUS | Status: AC | PRN
  Administered 2019-04-06: 9.7 via INTRAVENOUS
  Filled 2019-04-06: qty 10

## 2019-04-06 NOTE — Progress Notes (Signed)
MPI study read by Dr Jens Som and interpreted as high risk due to reduced LV function. Results of MPI study were shown to Dr Rennis Golden who was DOD, April 06, 2019.
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
## 2019-04-19 ENCOUNTER — Ambulatory Visit (INDEPENDENT_AMBULATORY_CARE_PROVIDER_SITE_OTHER): Payer: Self-pay | Admitting: Physician Assistant

## 2019-04-19 ENCOUNTER — Other Ambulatory Visit: Payer: Self-pay

## 2019-04-19 ENCOUNTER — Encounter: Payer: Self-pay | Admitting: Physician Assistant

## 2019-04-19 VITALS — BP 120/80 | HR 86 | Ht 74.0 in | Wt 282.8 lb

## 2019-04-19 DIAGNOSIS — I5043 Acute on chronic combined systolic (congestive) and diastolic (congestive) heart failure: Secondary | ICD-10-CM

## 2019-04-19 DIAGNOSIS — I5041 Acute combined systolic (congestive) and diastolic (congestive) heart failure: Secondary | ICD-10-CM

## 2019-04-19 DIAGNOSIS — I255 Ischemic cardiomyopathy: Secondary | ICD-10-CM

## 2019-04-19 DIAGNOSIS — I2510 Atherosclerotic heart disease of native coronary artery without angina pectoris: Secondary | ICD-10-CM

## 2019-04-19 DIAGNOSIS — E782 Mixed hyperlipidemia: Secondary | ICD-10-CM

## 2019-04-19 DIAGNOSIS — I10 Essential (primary) hypertension: Secondary | ICD-10-CM

## 2019-04-19 NOTE — Patient Instructions (Signed)
Medication Instructions:  Your physician recommends that you continue on your current medications as directed. Please refer to the Current Medication list given to you today.   *If you need a refill on your cardiac medications before your next appointment, please call your pharmacy*   Lab Work: TODAY:  BMET & PRO BNP  If you have labs (blood work) drawn today and your tests are completely normal, you will receive your results only by: Marland Kitchen MyChart Message (if you have MyChart) OR . A paper copy in the mail If you have any lab test that is abnormal or we need to change your treatment, we will call you to review the results.   Testing/Procedures: None ordered   Follow-Up: At [facility], you and your health needs are our priority.  As part of our continuing mission to provide you with exceptional heart care, we have created designated Provider Care Teams.  These Care Teams include your primary Cardiologist (physician) and Advanced Practice Providers (APPs -  Physician Assistants and Nurse Practitioners) who all work together to provide you with the care you need, when you need it.  We recommend signing up for the patient portal called "MyChart".  Sign up information is provided on this After Visit Summary.  MyChart is used to connect with patients for Virtual Visits (Telemedicine).  Patients are able to view lab/test results, encounter notes, upcoming appointments, etc.  Non-urgent messages can be sent to your provider as well.   To learn more about what you can do with MyChart, go to [email or web address].    Your next appointment:   3 month(s)  The format for your next appointment:   In Person  Provider:   You may see Dr. Olga Millers or one of the following Advanced Practice Providers on your designated Care Team:    Corine Shelter, PA-C  [place], [place]  Edd Fabian, [place]    Other Instructions Monitor your blood pressure daily and keep a log.  Call us in 1 week  with those readings.

## 2019-04-20 ENCOUNTER — Encounter: Payer: Self-pay | Admitting: Physician Assistant

## 2019-04-20 LAB — BASIC METABOLIC PANEL
BUN/Creatinine Ratio: 15 (ref 9–20)
BUN: 16 mg/dL (ref 6–24)
CO2: 20 mmol/L (ref 20–29)
Calcium: 9.1 mg/dL (ref 8.7–10.2)
Chloride: 106 mmol/L (ref 96–106)
Creatinine, Ser: 1.1 mg/dL (ref 0.76–1.27)
GFR calc Af Amer: 84 mL/min/{1.73_m2} (ref >59)
GFR calc non Af Amer: 73 mL/min/{1.73_m2} (ref >59)
Glucose: 96 mg/dL (ref 65–99)
Potassium: 4.4 mmol/L (ref 3.5–5.2)
Sodium: 142 mmol/L (ref 134–144)

## 2019-04-20 LAB — PRO B NATRIURETIC PEPTIDE: NT-Pro BNP: 638 pg/mL — ABNORMAL HIGH (ref 0–210)

## 2019-04-20 NOTE — Telephone Encounter (Signed)
Follow up  ° ° °Pt is returning call  ° ° °Please call back  °

## 2019-04-20 NOTE — Telephone Encounter (Signed)
This encounter was created in error - please disregard.

## 2019-04-24 ENCOUNTER — Telehealth: Payer: Self-pay

## 2019-04-24 NOTE — Telephone Encounter (Signed)
-----   Message from [place], [place] sent at 04/20/2019 11:11 AM EDT ----- Fluid marker minimally up.  Normal renal function and electrolytes.  Start Lasix 40 mg daily for 3 days and then as needed for lower extremity edema or shortness of breath.  Send [place] 1 week of blood pressure readings next week

## 2019-04-24 NOTE — Telephone Encounter (Signed)
Pt verbalized understanding of his lab results.. will forward to [facility] PA to clarify the Lasix dose.. he is currently taking Lasix 40 mg po daily... should he add an extra 40 mg the next three days?

## 2019-04-24 NOTE — Telephone Encounter (Signed)
Per [employer] PA.. pt verbalized understanding and will call back next week with his BP readings and let us know how he is doing.   Yes, add extra Lasix for 3 days and then daily dose. He may take additional 40 mg for dyspnea or lower extremity edema. Needs to keep track of his blood pressure.

## 2019-06-08 ENCOUNTER — Telehealth: Payer: Self-pay

## 2019-06-08 MED ORDER — FUROSEMIDE 40 MG PO TABS: 40.0000 mg | ORAL_TABLET | Freq: Every day | ORAL | 3 refills | Status: DC

## 2019-06-08 MED ORDER — CARVEDILOL 3.125 MG PO TABS: 3.1250 mg | ORAL_TABLET | Freq: Two times a day (BID) | ORAL | 3 refills | Status: DC

## 2019-06-08 MED ORDER — ROSUVASTATIN CALCIUM 40 MG PO TABS: 40.0000 mg | ORAL_TABLET | Freq: Every day | ORAL | 3 refills | Status: DC

## 2019-06-08 MED ORDER — LOSARTAN POTASSIUM 25 MG PO TABS: 25.0000 mg | ORAL_TABLET | Freq: Every day | ORAL | 3 refills | Status: DC

## 2019-06-08 NOTE — Telephone Encounter (Signed)
Pt's medications were sent to pt's pharmacy as requested. Confirmation received.  

## 2019-07-03 ENCOUNTER — Other Ambulatory Visit (HOSPITAL_COMMUNITY): Payer: Self-pay | Admitting: Nurse Practitioner

## 2019-07-03 DIAGNOSIS — Z01811 Encounter for preprocedural respiratory examination: Secondary | ICD-10-CM

## 2019-07-12 ENCOUNTER — Telehealth: Payer: Self-pay | Admitting: *Deleted

## 2019-07-12 NOTE — Telephone Encounter (Signed)
Forwarded to requesting providers office via [employer] function.

## 2019-07-12 NOTE — Telephone Encounter (Signed)
Primary Cardiologist:Ladajah Soltys Jens Som, MD  Chart reviewed as part of pre-operative protocol coverage. Because of Frank Flowers's past medical history and time since last visit, he/she will require a follow-up visit in order to better assess preoperative cardiovascular risk.  Pre-op covering staff: - Please schedule appointment and call patient to inform them. - Please contact requesting surgeon's office via preferred method (i.e, phone, fax) to inform them of need for appointment prior to surgery.  If applicable, this message will also be routed to pharmacy pool and/or primary cardiologist for input on holding anticoagulant/antiplatelet agent as requested below so that this information is available at time of patient's appointment.   Ronney Asters, NP  07/12/2019, 12:01 PM

## 2019-07-12 NOTE — Telephone Encounter (Signed)
Called pt and scheduled first available appt 08-14-19 @345pm . Pt will arrive early wearing a mask.

## 2019-07-12 NOTE — Telephone Encounter (Signed)
   [facility] Pre-operative Risk Assessment    HEARTCARE STAFF: - Please ensure there is not already an duplicate clearance open for this procedure. - Under Visit Info/Reason for Call, type in Other and utilize the format Clearance MM/DD/YY or Clearance TBD. Do not use dashes or single digits. - If request is for dental extraction, please clarify the # of teeth to be extracted.  Request for surgical clearance:  1. What type of surgery is being performed? RIGHT TOTAL HIP ARTHOPLASTY   2. When is this surgery scheduled? TBD   3. What type of clearance is required (medical clearance vs. Pharmacy clearance to hold med vs. Both)? MEDICAL  4. Are there any medications that need to be held prior to surgery and how long? ASA    5. Practice name and name of physician performing surgery? [facility]; DR. Lyla Glassing   6. What is the office phone number? [phone]   7.   What is the office fax number? [phone] ATTN: ASHLEY HITLON  8.   Anesthesia type (None, local, MAC, general) ? SPINAL   Julaine Hua 07/12/2019, 11:42 AM  _________________________________________________________________   (provider comments below)

## 2019-07-31 ENCOUNTER — Ambulatory Visit: Payer: Self-pay | Admitting: Student
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
## 2019-08-07 ENCOUNTER — Ambulatory Visit (INDEPENDENT_AMBULATORY_CARE_PROVIDER_SITE_OTHER): Payer: 59 | Admitting: General Practice

## 2019-08-07 ENCOUNTER — Encounter: Payer: Self-pay | Admitting: General Practice

## 2019-08-07 ENCOUNTER — Other Ambulatory Visit: Payer: Self-pay

## 2019-08-07 VITALS — BP 130/70 | HR 85 | Ht 74.0 in | Wt 284.0 lb

## 2019-08-07 DIAGNOSIS — I255 Ischemic cardiomyopathy: Secondary | ICD-10-CM

## 2019-08-07 DIAGNOSIS — I2510 Atherosclerotic heart disease of native coronary artery without angina pectoris: Secondary | ICD-10-CM | POA: Diagnosis not present

## 2019-08-07 DIAGNOSIS — E782 Mixed hyperlipidemia: Secondary | ICD-10-CM | POA: Diagnosis not present

## 2019-08-07 DIAGNOSIS — Z01810 Encounter for preprocedural cardiovascular examination: Secondary | ICD-10-CM

## 2019-08-07 DIAGNOSIS — I10 Essential (primary) hypertension: Secondary | ICD-10-CM

## 2019-08-07 NOTE — Patient Instructions (Signed)
Medication Instructions:  Your physician recommends that you continue on your current medications as directed. Please refer to the Current Medication list given to you today.  *If you need a refill on your cardiac medications before your next appointment, please call your pharmacy*    Testing/Procedures: Your physician has requested that you have an echocardiogram. Echocardiography is a painless test that uses sound waves to create images of your heart. It provides your doctor with information about the size and shape of your heart and how well your heart's chambers and valves are working. This procedure takes approximately one hour. There are no restrictions for this procedure.   Follow-Up: At [facility], you and your health needs are our priority.  As part of our continuing mission to provide you with exceptional heart care, we have created designated Provider Care Teams.  These Care Teams include your primary Cardiologist (physician) and Advanced Practice Providers (APPs -  Physician Assistants and Nurse Practitioners) who all work together to provide you with the care you need, when you need it.  We recommend signing up for the patient portal called "MyChart".  Sign up information is provided on this After Visit Summary.  MyChart is used to connect with patients for Virtual Visits (Telemedicine).  Patients are able to view lab/test results, encounter notes, upcoming appointments, etc.  Non-urgent messages can be sent to your provider as well.   To learn more about what you can do with MyChart, go to [email or web address].    Your next appointment:   1 month(s)  The format for your next appointment:   In Person  Provider:   Edd Fabian, NP   Other Instructions We will call you with the results of your echocardiogram and to let you know if you are cleared for your procedure.

## 2019-08-08 ENCOUNTER — Ambulatory Visit (HOSPITAL_COMMUNITY)
Admission: RE | Admit: 2019-08-08 | Discharge: 2019-08-08 | Disposition: A | Discharge status: Home / Self Care | Payer: 59 | Source: Ambulatory Visit | Attending: General Practice | Admitting: General Practice

## 2019-08-08 DIAGNOSIS — I10 Essential (primary) hypertension: Secondary | ICD-10-CM | POA: Diagnosis not present

## 2019-08-08 DIAGNOSIS — I255 Ischemic cardiomyopathy: Secondary | ICD-10-CM | POA: Diagnosis not present

## 2019-08-08 DIAGNOSIS — Z01810 Encounter for preprocedural cardiovascular examination: Secondary | ICD-10-CM | POA: Diagnosis not present

## 2019-08-08 LAB — ECHOCARDIOGRAM COMPLETE
Area-P 1/2: 3.27 cm2
Calc EF: 34.1 %
S' Lateral: 5.5 cm
Single Plane A2C EF: 31.5 %
Single Plane A4C EF: 35.4 %

## 2019-08-08 NOTE — Progress Notes (Signed)
Echocardiogram 2D Echocardiogram has been performed.  Frank Flowers [name] 08/08/2019, 8:37 AM

## 2019-08-09 ENCOUNTER — Telehealth: Payer: Self-pay | Admitting: General Practice

## 2019-08-09 DIAGNOSIS — I255 Ischemic cardiomyopathy: Secondary | ICD-10-CM

## 2019-08-09 NOTE — Telephone Encounter (Signed)
Patient wanted to know if his EKG was better and he could have his hip surgery 08/16/19. Please advise

## 2019-08-09 NOTE — Patient Instructions (Signed)
DUE TO COVID-19 ONLY ONE VISITOR IS ALLOWED TO COME WITH YOU AND STAY IN THE WAITING ROOM ONLY DURING PRE OP AND PROCEDURE DAY OF SURGERY. THE 2 VISITORS  MAY VISIT WITH YOU AFTER SURGERY IN YOUR PRIVATE ROOM DURING VISITING HOURS ONLY!  YOU NEED TO HAVE A COVID 19 TEST ON_8/2______ @_______ , THIS TEST MUST BE DONE BEFORE SURGERY, COVID TESTING SITE [address] [place] , IT IS ON THE RIGHT GOING OUT [address] 2 MINUTES PAST [employer] ON THE RIGHT. ONCE YOUR COVID TEST IS COMPLETED,  PLEASE BEGIN THE QUARANTINE INSTRUCTIONS AS OUTLINED IN YOUR HANDOUT.                [id number]    Your procedure is scheduled on: 08/16/19   Report to [facility] Main  Entrance   Report to Short Stay at 5:30  AM     Call this number if you have problems the morning of surgery [phone]    BRUSH YOUR TEETH MORNING OF SURGERY AND RINSE YOUR MOUTH OUT, NO CHEWING GUM CANDY OR MINTS.   Do not eat food After Midnight.   YOU MAY HAVE CLEAR LIQUIDS FROM MIDNIGHT UNTIL 4:30AM.   At 4:30AM Please finish the prescribed Pre-Surgery  drink.   Nothing by mouth after you finish the  drink !   Take these medicines the morning of surgery with A SIP OF WATER: Gabapentin, Zoloft, Coreg   use your inhaler and bring it with you to the hospital              You may not have any metal on your body including              piercings  Do not wear jewelry, lotions, powders or deodorant    y.              Men may shave face and neck.   Do not bring valuables to the hospital. [facility] IS NOT             RESPONSIBLE   FOR VALUABLES.  Contacts, dentures or bridgework may not be worn into surgery.       Patients discharged the day of surgery will not be allowed to drive home.   IF YOU ARE HAVING SURGERY AND GOING HOME THE SAME DAY, YOU MUST HAVE AN ADULT TO DRIVE YOU HOME AND BE WITH YOU FOR 24 HOURS.   YOU MAY GO HOME BY TAXI OR UBER OR ORTHERWISE,  BUT AN ADULT MUST ACCOMPANY YOU HOME AND STAY WITH YOU FOR 24 HOURS.  Name and phone number of your driver:  Special Instructions: N/A              Please read over the following fact sheets you were given: _____________________________________________________________________  [facility] - Preparing for Surgery  Before surgery, you can play an important role.   Because skin is not sterile, your skin needs to be as free of germs as possible.   You can reduce the number of germs on your skin by washing with CHG (chlorahexidine gluconate) soap before surgery.   CHG is an antiseptic cleaner which kills germs and bonds with the skin to continue killing germs even after washing. Please DO NOT use if you have an allergy to CHG or antibacterial soaps.   If your skin becomes reddened/irritated stop using the CHG and inform your nurse when you arrive at Short Stay.  You may  shave your face/neck.  Please follow these instructions carefully:  1.  Shower with CHG Soap the night before surgery and the  morning of Surgery.  2.  If you choose to wash your hair, wash your hair first as usual with your  normal  shampoo.  3.  After you shampoo, rinse your hair and body thoroughly to remove the  shampoo.                                        4.  Use CHG as you would any other liquid soap.  You can apply chg directly  to the skin and wash                       Gently with a scrungie or clean washcloth.  5.  Apply the CHG Soap to your body ONLY FROM THE NECK DOWN.   Do not use on face/ open                           Wound or open sores. Avoid contact with eyes, ears mouth and genitals (private parts).                       Wash face,  Genitals (private parts) with your normal soap.             6.  Wash thoroughly, paying special attention to the area where your surgery  will be performed.  7.  Thoroughly rinse your body with warm water from the neck down.  8.  DO NOT shower/wash with your normal soap after  using and rinsing off  the CHG Soap.             9.  Pat yourself dry with a clean towel.            10.  Wear clean pajamas.            11.  Place clean sheets on your bed the night of your first shower and do not  sleep with pets. Day of Surgery : Do not apply any lotions/deodorants the morning of surgery.  Please wear clean clothes to the hospital/surgery center.  FAILURE TO FOLLOW THESE INSTRUCTIONS MAY RESULT IN THE CANCELLATION OF YOUR SURGERY PATIENT SIGNATURE_________________________________  NURSE SIGNATURE__________________________________  ________________________________________________________________________   Rogelia Mire  An incentive spirometer is a tool that can help keep your lungs clear and active. This tool measures how well you are filling your lungs with each breath. Taking long deep breaths may help reverse or decrease the chance of developing breathing (pulmonary) problems (especially infection) following:  A long period of time when you are unable to move or be active. BEFORE THE PROCEDURE   If the spirometer includes an indicator to show your best effort, your nurse or respiratory therapist will set it to a desired goal.  If possible, sit up straight or lean slightly forward. Try not to slouch.  Hold the incentive spirometer in an upright position. INSTRUCTIONS FOR USE  1. Sit on the edge of your bed if possible, or sit up as far as you can in bed or on a chair. 2. Hold the incentive spirometer in an upright position. 3. Breathe out normally. 4. Place the mouthpiece in your mouth and seal your lips tightly around it. 5.  Breathe in slowly and as deeply as possible, raising the piston or the ball toward the top of the column. 6. Hold your breath for 3-5 seconds or for as long as possible. Allow the piston or ball to fall to the bottom of the column. 7. Remove the mouthpiece from your mouth and breathe out normally. 8. Rest for a few seconds and  repeat Steps 1 through 7 at least 10 times every 1-2 hours when you are awake. Take your time and take a few normal breaths between deep breaths. 9. The spirometer may include an indicator to show your best effort. Use the indicator as a goal to work toward during each repetition. 10. After each set of 10 deep breaths, practice coughing to be sure your lungs are clear. If you have an incision (the cut made at the time of surgery), support your incision when coughing by placing a pillow or rolled up towels firmly against it. Once you are able to get out of bed, walk around indoors and cough well. You may stop using the incentive spirometer when instructed by your caregiver.  RISKS AND COMPLICATIONS  Take your time so you do not get dizzy or light-headed.  If you are in pain, you may need to take or ask for pain medication before doing incentive spirometry. It is harder to take a deep breath if you are having pain. AFTER USE  Rest and breathe slowly and easily.  It can be helpful to keep track of a log of your progress. Your caregiver can provide you with a simple table to help with this. If you are using the spirometer at home, follow these instructions: SEEK MEDICAL CARE IF:   You are having difficultly using the spirometer.  You have trouble using the spirometer as often as instructed.  Your pain medication is not giving enough relief while using the spirometer.  You develop fever of 100.5 F (38.1 C) or higher. SEEK IMMEDIATE MEDICAL CARE IF:   You cough up bloody sputum that had not been present before.  You develop fever of 102 F (38.9 C) or greater.  You develop worsening pain at or near the incision site. MAKE SURE YOU:   Understand these instructions.  Will watch your condition.  Will get help right away if you are not doing well or get worse. Document Released: 05/10/2006 Document Revised: 03/22/2011 Document Reviewed: 07/11/2006 [facility] Patient Information 2014  [place], [place].   ________________________________________________________________________

## 2019-08-09 NOTE — Telephone Encounter (Signed)
Pt advised his Echo results and verbalized understanding. He agrees to see Ep for possible ICD. He is asking for an appt asap since he is having a lot of ain and wants to have this done soon to have his surgery.   Will order his referral and forward to [facility] to make his consult appt for Low EF on Echo.

## 2019-08-10 ENCOUNTER — Encounter (HOSPITAL_COMMUNITY): Payer: Self-pay | Admitting: Physician Assistant

## 2019-08-10 ENCOUNTER — Encounter (HOSPITAL_COMMUNITY)
Admission: RE | Admit: 2019-08-10 | Discharge: 2019-08-10 | Disposition: A | Discharge status: Home / Self Care | Payer: 59 | Source: Ambulatory Visit | Attending: Orthopedic Surgery | Admitting: Orthopedic Surgery

## 2019-08-13 ENCOUNTER — Telehealth: Payer: Self-pay | Admitting: General Practice

## 2019-08-13 NOTE — Telephone Encounter (Signed)
Called and spoke with pt, notified of Edd Fabian NP's advice. Pt verbalized understanding. He reports that the office performing the surgery had called and told him that "he was cleared" and that "they do this all the time". Again notified of Jesse's advice and pt was agreeable. No other questions at this time. Pt aware of appt with Dr.Crenshaw on 8/9 and with Dr.Allred on 8/10

## 2019-08-13 NOTE — Telephone Encounter (Signed)
Follow Up:   Pt wants to know if he is cleared for surgery on 08-16-19?

## 2019-08-14 ENCOUNTER — Ambulatory Visit: Payer: Self-pay | Admitting: General Practice
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
## 2019-08-16 ENCOUNTER — Ambulatory Visit (HOSPITAL_COMMUNITY): Admission: RE | Admit: 2019-08-16 | Payer: 59 | Source: Home / Self Care | Admitting: Orthopedic Surgery

## 2019-08-16 ENCOUNTER — Encounter (HOSPITAL_COMMUNITY): Admission: RE | Payer: Self-pay | Source: Home / Self Care

## 2019-08-16 SURGERY — ARTHROPLASTY, HIP, TOTAL, ANTERIOR APPROACH
Anesthesia: Spinal | Site: Hip | Laterality: Right

## 2019-08-20 ENCOUNTER — Other Ambulatory Visit: Payer: Self-pay

## 2019-08-20 ENCOUNTER — Other Ambulatory Visit: Payer: Self-pay | Admitting: *Deleted

## 2019-08-20 ENCOUNTER — Other Ambulatory Visit (HOSPITAL_COMMUNITY)
Admission: RE | Admit: 2019-08-20 | Discharge: 2019-08-20 | Disposition: A | Discharge status: Home / Self Care | Payer: 59 | Source: Ambulatory Visit | Attending: Interventional Cardiology | Admitting: Interventional Cardiology

## 2019-08-20 ENCOUNTER — Encounter: Payer: Self-pay | Admitting: Cardiology

## 2019-08-20 ENCOUNTER — Ambulatory Visit (INDEPENDENT_AMBULATORY_CARE_PROVIDER_SITE_OTHER): Payer: 59 | Admitting: Cardiology

## 2019-08-20 VITALS — BP 134/78 | HR 86 | Temp 96.6°F | Ht 74.0 in | Wt 294.0 lb

## 2019-08-20 DIAGNOSIS — I10 Essential (primary) hypertension: Secondary | ICD-10-CM

## 2019-08-20 DIAGNOSIS — I2510 Atherosclerotic heart disease of native coronary artery without angina pectoris: Secondary | ICD-10-CM

## 2019-08-20 DIAGNOSIS — Z20822 Contact with and (suspected) exposure to covid-19: Secondary | ICD-10-CM | POA: Insufficient documentation

## 2019-08-20 DIAGNOSIS — I255 Ischemic cardiomyopathy: Secondary | ICD-10-CM | POA: Diagnosis not present

## 2019-08-20 DIAGNOSIS — Z01812 Encounter for preprocedural laboratory examination: Secondary | ICD-10-CM | POA: Insufficient documentation

## 2019-08-20 DIAGNOSIS — E782 Mixed hyperlipidemia: Secondary | ICD-10-CM | POA: Diagnosis not present

## 2019-08-20 LAB — SARS CORONAVIRUS 2 (TAT 6-24 HRS): SARS Coronavirus 2: NEGATIVE

## 2019-08-20 MED ORDER — SODIUM CHLORIDE 0.9% FLUSH
3.0000 mL | Freq: Two times a day (BID) | INTRAVENOUS | Status: DC
Start: 2019-08-20 — End: 2020-09-19

## 2019-08-20 MED ORDER — ENTRESTO 24-26 MG PO TABS: 1.0000 | ORAL_TABLET | Freq: Two times a day (BID) | ORAL | 11 refills | Status: DC

## 2019-08-20 NOTE — Patient Instructions (Addendum)
Medication Instructions:   STOP LOSARTAN  START ENTRESTO 24/26 MG ONE TABLET TWICE DAILY  *If you need a refill on your cardiac medications before your next appointment, please call your pharmacy*   Lab Work:  Your physician recommends    YOU HAVE LAB WORK TODAY If you have labs (blood work) drawn today and your tests are completely normal, you will receive your results only by: Marland Kitchen MyChart Message (if you have MyChart) OR . A paper copy in the mail If you have any lab test that is abnormal or we need to change your treatment, we will call you to review the results.   Testing/Procedures:   You are scheduled for a Cardiac Catheterization on Thursday, August 12 with Dr. Lance Muss.  1. Please arrive at the [facility] (Main Entrance A) at [facility]: [address] [place], [place] [address] at 7:00 AM (This time is two hours before your procedure to ensure your preparation). Free valet parking service is available.   Special note: Every effort is made to have your procedure done on time. Please understand that emergencies sometimes delay scheduled procedures.  2. Diet: Do not eat solid foods after midnight.  The patient may have clear liquids until 5am upon the day of the procedure.  GO TO [address] [place] TODAY @ 2:25 PM FOR COVID TESTING  4. Medication instructions in preparation for your procedure:  DO NOT TAKE FUROSEMIDE THE MORNING OF THE PROCEDURE  On the morning of your procedure, take your Aspirin and any morning medicines NOT listed above.  You may use sips of water.  5. Plan for one night stay--bring personal belongings. 6. Bring a current list of your medications and current insurance cards. 7. You MUST have a responsible person to drive you home. 8. Someone MUST be with you the first 24 hours after you arrive home or your discharge will be delayed. 9. Please wear clothes that are easy to get on and off and wear slip-on shoes.  Thank  you for allowing [place] to care for you!   -- [facility]    Follow-Up: At [facility], you and your health needs are our priority.  As part of our continuing mission to provide you with exceptional heart care, we have created designated Provider Care Teams.  These Care Teams include your primary Cardiologist (physician) and Advanced Practice Providers (APPs -  Physician Assistants and Nurse Practitioners) who all work together to provide you with the care you need, when you need it.  We recommend signing up for the patient portal called "MyChart".  Sign up information is provided on this After Visit Summary.  MyChart is used to connect with patients for Virtual Visits (Telemedicine).  Patients are able to view lab/test results, encounter notes, upcoming appointments, etc.  Non-urgent messages can be sent to your provider as well.   To learn more about what you can do with MyChart, go to [email or web address].    Your next appointment:    Your physician recommends that you schedule a follow-up appointment in: 4-6 WEEKS WITH APP  Your physician recommends that you schedule a follow-up appointment in: 3 MONTHS WITH DR Jens Som

## 2019-08-21 ENCOUNTER — Ambulatory Visit (INDEPENDENT_AMBULATORY_CARE_PROVIDER_SITE_OTHER): Payer: 59 | Admitting: Internal Medicine

## 2019-08-21 ENCOUNTER — Encounter: Payer: Self-pay | Admitting: Internal Medicine

## 2019-08-21 VITALS — BP 132/80 | HR 80 | Ht 74.0 in | Wt 296.8 lb

## 2019-08-21 DIAGNOSIS — I255 Ischemic cardiomyopathy: Secondary | ICD-10-CM | POA: Diagnosis not present

## 2019-08-21 DIAGNOSIS — Z7289 Other problems related to lifestyle: Secondary | ICD-10-CM

## 2019-08-21 DIAGNOSIS — I10 Essential (primary) hypertension: Secondary | ICD-10-CM

## 2019-08-21 DIAGNOSIS — I2510 Atherosclerotic heart disease of native coronary artery without angina pectoris: Secondary | ICD-10-CM | POA: Diagnosis not present

## 2019-08-21 DIAGNOSIS — Z789 Other specified health status: Secondary | ICD-10-CM

## 2019-08-21 DIAGNOSIS — Z720 Tobacco use: Secondary | ICD-10-CM

## 2019-08-21 LAB — BASIC METABOLIC PANEL
BUN/Creatinine Ratio: 14 (ref 9–20)
BUN: 17 mg/dL (ref 6–24)
CO2: 23 mmol/L (ref 20–29)
Calcium: 9.4 mg/dL (ref 8.7–10.2)
Chloride: 103 mmol/L (ref 96–106)
Creatinine, Ser: 1.2 mg/dL (ref 0.76–1.27)
GFR calc Af Amer: 76 mL/min/{1.73_m2} (ref >59)
GFR calc non Af Amer: 66 mL/min/{1.73_m2} (ref >59)
Glucose: 83 mg/dL (ref 65–99)
Potassium: 4.4 mmol/L (ref 3.5–5.2)
Sodium: 141 mmol/L (ref 134–144)

## 2019-08-21 LAB — CBC
Hematocrit: 55.9 % — ABNORMAL HIGH (ref 37.5–51.0)
Hemoglobin: 18.2 g/dL — ABNORMAL HIGH (ref 13.0–17.7)
MCH: 29.3 pg (ref 26.6–33.0)
MCHC: 32.6 g/dL (ref 31.5–35.7)
MCV: 90 fL (ref 79–97)
Platelets: 222 10*3/uL (ref 150–450)
RBC: 6.22 x10E6/uL — ABNORMAL HIGH (ref 4.14–5.80)
RDW: 14.5 % (ref 11.6–15.4)
WBC: 8.5 10*3/uL (ref 3.4–10.8)
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
## 2019-08-21 NOTE — Patient Instructions (Signed)
Medication Instructions:  Your physician recommends that you continue on your current medications as directed. Please refer to the Current Medication list given to you today.  *If you need a refill on your cardiac medications before your next appointment, please call your pharmacy*   Lab Work: None ordered.  If you have labs (blood work) drawn today and your tests are completely normal, you will receive your results only by: Marland Kitchen MyChart Message (if you have MyChart) OR . A paper copy in the mail If you have any lab test that is abnormal or we need to change your treatment, we will call you to review the results.   Testing/Procedures: None ordered.    Follow-Up: At [facility], you and your health needs are our priority.  As part of our continuing mission to provide you with exceptional heart care, we have created designated Provider Care Teams.  These Care Teams include your primary Cardiologist (physician) and Advanced Practice Providers (APPs -  Physician Assistants and Nurse Practitioners) who all work together to provide you with the care you need, when you need it.  We recommend signing up for the patient portal called "MyChart".  Sign up information is provided on this After Visit Summary.  MyChart is used to connect with patients for Virtual Visits (Telemedicine).  Patients are able to view lab/test results, encounter notes, upcoming appointments, etc.  Non-urgent messages can be sent to your provider as well.   To learn more about what you can do with MyChart, go to [email or web address].    Your next appointment:   As needed with Dr Johney Frame

## 2019-08-22 ENCOUNTER — Encounter: Payer: Self-pay | Admitting: *Deleted

## 2019-08-22 ENCOUNTER — Telehealth: Payer: Self-pay | Admitting: *Deleted

## 2019-08-22 NOTE — Telephone Encounter (Signed)
Pt contacted pre-catheterization scheduled at [facility] for: Thursday August 23, 2019 9 AM Verified arrival time and place: [facility] Main Entrance A [facility]) at: 7 AM   No solid food after midnight prior to cath, clear liquids until 5 AM day of procedure.  Hold: Lasix-AM of procedure   Except hold medications AM meds can be  taken pre-cath with sips of water including: ASA 81 mg   Confirmed patient has responsible adult to drive home post procedure and observe 24 hours after arriving home: yes  You are allowed ONE visitor in the waiting room during your procedure. Both you and your visitor must wear a mask once you enter the hospital.      COVID-19 Pre-Screening Questions:  . In the past 14 days have you had a new cough associated with shortness of breath, unexplained body aches, fever (100.4 or greater) or sudden loss of taste or sense of smell? no . In the past 14 days have you been around anyone with known Covid 19? no . Have you been vaccinated for COVID-19? Yes, see immunization history   Reviewed procedure/mask/visitor instructions, COVID-19 questions with patient.

## 2019-08-23 ENCOUNTER — Ambulatory Visit (HOSPITAL_COMMUNITY)
Admission: RE | Admit: 2019-08-23 | Discharge: 2019-08-23 | Disposition: A | Discharge status: Home / Self Care | Payer: 59 | Attending: Interventional Cardiology | Admitting: Interventional Cardiology

## 2019-08-23 ENCOUNTER — Encounter (HOSPITAL_COMMUNITY)
Admission: RE | Disposition: A | Discharge status: Home / Self Care | Payer: 59 | Source: Home / Self Care | Attending: Interventional Cardiology

## 2019-08-23 DIAGNOSIS — I2510 Atherosclerotic heart disease of native coronary artery without angina pectoris: Secondary | ICD-10-CM | POA: Diagnosis not present

## 2019-08-23 DIAGNOSIS — I5022 Chronic systolic (congestive) heart failure: Secondary | ICD-10-CM | POA: Insufficient documentation

## 2019-08-23 DIAGNOSIS — I110 Hypertensive heart disease with heart failure: Secondary | ICD-10-CM | POA: Insufficient documentation

## 2019-08-23 DIAGNOSIS — Z79899 Other long term (current) drug therapy: Secondary | ICD-10-CM | POA: Diagnosis not present

## 2019-08-23 DIAGNOSIS — I255 Ischemic cardiomyopathy: Secondary | ICD-10-CM | POA: Insufficient documentation

## 2019-08-23 DIAGNOSIS — Z955 Presence of coronary angioplasty implant and graft: Secondary | ICD-10-CM | POA: Insufficient documentation

## 2019-08-23 DIAGNOSIS — E785 Hyperlipidemia, unspecified: Secondary | ICD-10-CM | POA: Insufficient documentation

## 2019-08-23 DIAGNOSIS — I252 Old myocardial infarction: Secondary | ICD-10-CM | POA: Insufficient documentation

## 2019-08-23 DIAGNOSIS — Z791 Long term (current) use of non-steroidal anti-inflammatories (NSAID): Secondary | ICD-10-CM | POA: Insufficient documentation

## 2019-08-23 DIAGNOSIS — F17210 Nicotine dependence, cigarettes, uncomplicated: Secondary | ICD-10-CM | POA: Insufficient documentation

## 2019-08-23 DIAGNOSIS — Z7982 Long term (current) use of aspirin: Secondary | ICD-10-CM | POA: Diagnosis not present

## 2019-08-23 HISTORY — PX: LEFT HEART CATH AND CORONARY ANGIOGRAPHY: CATH118249

## 2019-08-23 SURGERY — LEFT HEART CATH AND CORONARY ANGIOGRAPHY
Anesthesia: LOCAL

## 2019-08-23 MED ORDER — SODIUM CHLORIDE 0.9 % IV SOLN: 250.0000 mL | INTRAVENOUS | Status: DC | PRN

## 2019-08-23 MED ORDER — HEPARIN SODIUM (PORCINE) 1000 UNIT/ML IJ SOLN
INTRAMUSCULAR | Status: DC | PRN
  Administered 2019-08-23: 5000 [IU] via INTRAVENOUS

## 2019-08-23 MED ORDER — SODIUM CHLORIDE 0.9% FLUSH: 3.0000 mL | INTRAVENOUS | Status: DC | PRN

## 2019-08-23 MED ORDER — SODIUM CHLORIDE 0.9 % WEIGHT BASED INFUSION: 1.0000 mL/kg/h | INTRAVENOUS | Status: DC

## 2019-08-23 MED ORDER — LIDOCAINE HCL (PF) 1 % IJ SOLN
INTRAMUSCULAR | Status: DC | PRN
  Administered 2019-08-23: 2 mL

## 2019-08-23 MED ORDER — ASPIRIN 81 MG PO CHEW
81.0000 mg | CHEWABLE_TABLET | ORAL | Status: AC
  Administered 2019-08-23: 81 mg via ORAL
  Filled 2019-08-23: qty 1

## 2019-08-23 MED ORDER — FENTANYL CITRATE (PF) 100 MCG/2ML IJ SOLN
INTRAMUSCULAR | Status: DC | PRN
  Administered 2019-08-23: 25 ug via INTRAVENOUS

## 2019-08-23 MED ORDER — LIDOCAINE HCL (PF) 1 % IJ SOLN
INTRAMUSCULAR | Status: AC
  Filled 2019-08-23: qty 30

## 2019-08-23 MED ORDER — VERAPAMIL HCL 2.5 MG/ML IV SOLN
INTRAVENOUS | Status: DC | PRN
  Administered 2019-08-23: 10 mL via INTRA_ARTERIAL

## 2019-08-23 MED ORDER — IOHEXOL 350 MG/ML SOLN
INTRAVENOUS | Status: DC | PRN
  Administered 2019-08-23: 70 mL

## 2019-08-23 MED ORDER — SODIUM CHLORIDE 0.9 % WEIGHT BASED INFUSION
3.0000 mL/kg/h | INTRAVENOUS | Status: AC
  Administered 2019-08-23: 3 mL/kg/h via INTRAVENOUS

## 2019-08-23 MED ORDER — ACETAMINOPHEN 325 MG PO TABS: 650.0000 mg | ORAL_TABLET | ORAL | Status: DC | PRN

## 2019-08-23 MED ORDER — HEPARIN (PORCINE) IN NACL 1000-0.9 UT/500ML-% IV SOLN
INTRAVENOUS | Status: DC | PRN
  Administered 2019-08-23 (×2): 500 mL

## 2019-08-23 MED ORDER — HEPARIN (PORCINE) IN NACL 1000-0.9 UT/500ML-% IV SOLN
INTRAVENOUS | Status: AC
  Filled 2019-08-23: qty 1000

## 2019-08-23 MED ORDER — SODIUM CHLORIDE 0.9% FLUSH: 3.0000 mL | Freq: Two times a day (BID) | INTRAVENOUS | Status: DC

## 2019-08-23 MED ORDER — ONDANSETRON HCL 4 MG/2ML IJ SOLN: 4.0000 mg | Freq: Four times a day (QID) | INTRAMUSCULAR | Status: DC | PRN

## 2019-08-23 MED ORDER — HYDRALAZINE HCL 20 MG/ML IJ SOLN: 10.0000 mg | INTRAMUSCULAR | Status: DC | PRN

## 2019-08-23 MED ORDER — VERAPAMIL HCL 2.5 MG/ML IV SOLN
INTRAVENOUS | Status: AC
  Filled 2019-08-23: qty 2

## 2019-08-23 MED ORDER — LABETALOL HCL 5 MG/ML IV SOLN: 10.0000 mg | INTRAVENOUS | Status: DC | PRN

## 2019-08-23 MED ORDER — MIDAZOLAM HCL 2 MG/2ML IJ SOLN
INTRAMUSCULAR | Status: DC | PRN
  Administered 2019-08-23: 2 mg via INTRAVENOUS

## 2019-08-23 MED ORDER — MIDAZOLAM HCL 2 MG/2ML IJ SOLN
INTRAMUSCULAR | Status: AC
  Filled 2019-08-23: qty 2

## 2019-08-23 MED ORDER — HEPARIN SODIUM (PORCINE) 1000 UNIT/ML IJ SOLN
INTRAMUSCULAR | Status: AC
  Filled 2019-08-23: qty 1

## 2019-08-23 MED ORDER — FENTANYL CITRATE (PF) 100 MCG/2ML IJ SOLN
INTRAMUSCULAR | Status: AC
  Filled 2019-08-23: qty 2

## 2019-08-23 SURGICAL SUPPLY — 10 items
CATH 5FR JL3.5 JR4 ANG PIG MP (CATHETERS) ×2 IMPLANT
DEVICE RAD COMP TR BAND LRG (VASCULAR PRODUCTS) ×2 IMPLANT
GLIDESHEATH SLEND SS 6F .021 (SHEATH) ×2 IMPLANT
GUIDEWIRE INQWIRE 1.5J.035X260 (WIRE) ×1 IMPLANT
INQWIRE 1.5J .035X260CM (WIRE) ×2
KIT HEART LEFT (KITS) ×2 IMPLANT
PACK CARDIAC CATHETERIZATION (CUSTOM PROCEDURE TRAY) ×2 IMPLANT
SHEATH PROBE COVER 6X72 (BAG) ×2 IMPLANT
TRANSDUCER W/STOPCOCK (MISCELLANEOUS) ×2 IMPLANT
TUBING CIL FLEX 10 FLL-RA (TUBING) ×2 IMPLANT

## 2019-08-23 NOTE — Discharge Instructions (Signed)
Radial Site Care  This sheet gives you information about how to care for yourself after your procedure. Your health care provider may also give you more specific instructions. If you have problems or questions, contact your health care provider. What can I expect after the procedure? After the procedure, it is common to have:  Bruising and tenderness at the catheter insertion area. Follow these instructions at home: Medicines  Take over-the-counter and prescription medicines only as told by your health care provider. Insertion site care  Follow instructions from your health care provider about how to take care of your insertion site. Make sure you: ? Wash your hands with soap and water before you change your bandage (dressing). If soap and water are not available, use hand sanitizer. ? Change your dressing as told by your health care provider. ? Leave stitches (sutures), skin glue, or adhesive strips in place. These skin closures may need to stay in place for 2 weeks or longer. If adhesive strip edges start to loosen and curl up, you may trim the loose edges. Do not remove adhesive strips completely unless your health care provider tells you to do that.  Check your insertion site every day for signs of infection. Check for: ? Redness, swelling, or pain. ? Fluid or blood. ? Pus or a bad smell. ? Warmth.  Do not take baths, swim, or use a hot tub until your health care provider approves.  You may shower 24-48 hours after the procedure, or as directed by your health care provider. ? Remove the dressing and gently wash the site with plain soap and water. ? Pat the area dry with a clean towel. ? Do not rub the site. That could cause bleeding.  Do not apply powder or lotion to the site. Activity   For 24 hours after the procedure, or as directed by your health care provider: ? Do not flex or bend the affected arm. ? Do not push or pull heavy objects with the affected arm. ? Do not  drive yourself home from the hospital or clinic. You may drive 24 hours after the procedure unless your health care provider tells you not to. ? Do not operate machinery or power tools.  Do not lift anything that is heavier than 10 lb (4.5 kg), or the limit that you are told, until your health care provider says that it is safe.  Ask your health care provider when it is okay to: ? Return to work or school. ? Resume usual physical activities or sports. ? Resume sexual activity. General instructions  If the catheter site starts to bleed, raise your arm and put firm pressure on the site. If the bleeding does not stop, get help right away. This is a medical emergency.  If you went home on the same day as your procedure, a responsible adult should be with you for the first 24 hours after you arrive home.  Keep all follow-up visits as told by your health care provider. This is important. Contact a health care provider if:  You have a fever.  You have redness, swelling, or yellow drainage around your insertion site. Get help right away if:  You have unusual pain at the radial site.  The catheter insertion area swells very fast.  The insertion area is bleeding, and the bleeding does not stop when you hold steady pressure on the area.  Your arm or hand becomes pale, cool, tingly, or numb. These symptoms may represent a serious problem   that is an emergency. Do not wait to see if the symptoms will go away. Get medical help right away. Call your local emergency services (911 in the [place] Do not drive yourself to the hospital. Summary  After the procedure, it is common to have bruising and tenderness at the site.  Follow instructions from your health care provider about how to take care of your radial site wound. Check the wound every day for signs of infection.  Do not lift anything that is heavier than 10 lb (4.5 kg), or the limit that you are told, until your health care provider says  that it is safe. This information is not intended to replace advice given to you by your health care provider. Make sure you discuss any questions you have with your health care provider. Document Revised: 02/02/2017 Document Reviewed: 02/02/2017 Elsevier Patient Education  2020 [employer] Inc[employer]  

## 2019-08-23 NOTE — Progress Notes (Signed)
Discharge instructions reviewed with patient and family. Verbalized understanding. 

## 2019-08-23 NOTE — Interval H&P Note (Signed)
Cath Lab Visit (complete for each Cath Lab visit)  Clinical Evaluation Leading to the Procedure:   ACS: No.  Non-ACS:    Anginal Classification: CCS II  Anti-ischemic medical therapy: Maximal Therapy (2 or more classes of medications)  Non-Invasive Test Results: High-risk stress test findings: cardiac mortality >3%/year  Prior CABG: No previous CABG   Low EF   History and Physical Interval Note:  08/23/2019 9:29 AM  Frank Flowers  has presented today for surgery, with the diagnosis of ischemic cardiomyopathy.  The various methods of treatment have been discussed with the patient and family. After consideration of risks, benefits and other options for treatment, the patient has consented to  Procedure(s): LEFT HEART CATH AND CORONARY ANGIOGRAPHY (N/A) as a surgical intervention.  The patient's history has been reviewed, patient examined, no change in status, stable for surgery.  I have reviewed the patient's chart and labs.  Questions were answered to the patient's satisfaction.     Lance Muss

## 2019-08-24 ENCOUNTER — Encounter (HOSPITAL_COMMUNITY): Payer: Self-pay | Admitting: Interventional Cardiology
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
## 2019-09-11 ENCOUNTER — Other Ambulatory Visit: Payer: Self-pay

## 2019-09-11 ENCOUNTER — Encounter: Payer: Self-pay | Admitting: General Practice

## 2019-09-11 ENCOUNTER — Ambulatory Visit (INDEPENDENT_AMBULATORY_CARE_PROVIDER_SITE_OTHER): Payer: 59 | Admitting: General Practice

## 2019-09-11 VITALS — BP 136/92 | HR 93 | Ht 74.0 in | Wt 295.6 lb

## 2019-09-11 DIAGNOSIS — I2510 Atherosclerotic heart disease of native coronary artery without angina pectoris: Secondary | ICD-10-CM

## 2019-09-11 DIAGNOSIS — I255 Ischemic cardiomyopathy: Secondary | ICD-10-CM

## 2019-09-11 DIAGNOSIS — Z01810 Encounter for preprocedural cardiovascular examination: Secondary | ICD-10-CM

## 2019-09-11 DIAGNOSIS — I10 Essential (primary) hypertension: Secondary | ICD-10-CM

## 2019-09-11 DIAGNOSIS — E782 Mixed hyperlipidemia: Secondary | ICD-10-CM | POA: Diagnosis not present

## 2019-09-11 MED ORDER — LOSARTAN POTASSIUM 50 MG PO TABS: 50.0000 mg | ORAL_TABLET | Freq: Every day | ORAL | 3 refills | Status: DC

## 2019-09-11 NOTE — Addendum Note (Signed)
Addended by: Barrie Dunker on: 09/11/2019 03:04 PM   Modules accepted: Orders

## 2019-09-11 NOTE — Patient Instructions (Signed)
Medication Instructions:  INCREASE- Losartan 50 mg by mouth daily  *If you need a refill on your cardiac medications before your next appointment, please call your pharmacy*   Lab Work: BMP Today  If you have labs (blood work) drawn today and your tests are completely normal, you will receive your results only by: Marland Kitchen MyChart Message (if you have MyChart) OR . A paper copy in the mail If you have any lab test that is abnormal or we need to change your treatment, we will call you to review the results.   Testing/Procedures: None Ordered   Follow-Up: At [facility], you and your health needs are our priority.  As part of our continuing mission to provide you with exceptional heart care, we have created designated Provider Care Teams.  These Care Teams include your primary Cardiologist (physician) and Advanced Practice Providers (APPs -  Physician Assistants and Nurse Practitioners) who all work together to provide you with the care you need, when you need it.  We recommend signing up for the patient portal called "MyChart".  Sign up information is provided on this After Visit Summary.  MyChart is used to connect with patients for Virtual Visits (Telemedicine).  Patients are able to view lab/test results, encounter notes, upcoming appointments, etc.  Non-urgent messages can be sent to your provider as well.   To learn more about what you can do with MyChart, go to [email or web address].    Your next appointment:   1 month(s)  The format for your next appointment:   In Person  Provider:   Edd Fabian, FNP

## 2019-09-12 LAB — BASIC METABOLIC PANEL
BUN/Creatinine Ratio: 17 (ref 9–20)
BUN: 17 mg/dL (ref 6–24)
CO2: 24 mmol/L (ref 20–29)
Calcium: 9.3 mg/dL (ref 8.7–10.2)
Chloride: 102 mmol/L (ref 96–106)
Creatinine, Ser: 1.01 mg/dL (ref 0.76–1.27)
GFR calc Af Amer: 94 mL/min/{1.73_m2} (ref >59)
GFR calc non Af Amer: 81 mL/min/{1.73_m2} (ref >59)
Glucose: 82 mg/dL (ref 65–99)
Potassium: 4.1 mmol/L (ref 3.5–5.2)
Sodium: 140 mmol/L (ref 134–144)
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
## 2019-10-11 ENCOUNTER — Ambulatory Visit (INDEPENDENT_AMBULATORY_CARE_PROVIDER_SITE_OTHER): Payer: 59 | Admitting: General Practice

## 2019-10-11 ENCOUNTER — Encounter: Payer: Self-pay | Admitting: General Practice

## 2019-10-11 ENCOUNTER — Other Ambulatory Visit: Payer: Self-pay

## 2019-10-11 VITALS — BP 122/94 | HR 87 | Temp 97.8°F | Ht 74.0 in | Wt 296.2 lb

## 2019-10-11 DIAGNOSIS — E782 Mixed hyperlipidemia: Secondary | ICD-10-CM | POA: Diagnosis not present

## 2019-10-11 DIAGNOSIS — Z79899 Other long term (current) drug therapy: Secondary | ICD-10-CM

## 2019-10-11 DIAGNOSIS — I255 Ischemic cardiomyopathy: Secondary | ICD-10-CM

## 2019-10-11 DIAGNOSIS — I10 Essential (primary) hypertension: Secondary | ICD-10-CM

## 2019-10-11 DIAGNOSIS — I2510 Atherosclerotic heart disease of native coronary artery without angina pectoris: Secondary | ICD-10-CM | POA: Diagnosis not present

## 2019-10-11 DIAGNOSIS — Z01810 Encounter for preprocedural cardiovascular examination: Secondary | ICD-10-CM

## 2019-10-11 MED ORDER — ENTRESTO 49-51 MG PO TABS: 1.0000 | ORAL_TABLET | Freq: Two times a day (BID) | ORAL | 6 refills | Status: DC

## 2019-10-11 NOTE — Patient Instructions (Signed)
Medication Instructions:  STOP LOSARTAN  TAKE ENTRESTO 2 TAB OF 24/26MG  TWICE DAILY UNTIL GONE THEN START ENTRESTO 49/51MG  *If you need a refill on your cardiac medications before your next appointment, please call your pharmacy*  Lab Work:        Testing/Procedures:  BMET TODAY AND BEFORE FOLLOW UP APPOINTMENT NONE  Special Instructions TAKE AND LOG YOUR WEIGHT DAILY  PLEASE READ AND FOLLOW SALTY 6-ATTACHED  Follow-Up: Your next appointment:  4 week(s) In Person with Olga Millers, MD -OR- JESSE CLEAVER, FNP-C   At [facility], you and your health needs are our priority.  As part of our continuing mission to provide you with exceptional heart care, we have created designated Provider Care Teams.  These Care Teams include your primary Cardiologist (physician) and Advanced Practice Providers (APPs -  Physician Assistants and Nurse Practitioners) who all work together to provide you with the care you need, when you need it.  We recommend signing up for the patient portal called "MyChart".  Sign up information is provided on this After Visit Summary.  MyChart is used to connect with patients for Virtual Visits (Telemedicine).  Patients are able to view lab/test results, encounter notes, upcoming appointments, etc.  Non-urgent messages can be sent to your provider as well.   To learn more about what you can do with MyChart, go to [email or web address].

## 2019-10-12 LAB — BASIC METABOLIC PANEL
BUN/Creatinine Ratio: 11 (ref 9–20)
BUN: 12 mg/dL (ref 6–24)
CO2: 20 mmol/L (ref 20–29)
Calcium: 9.4 mg/dL (ref 8.7–10.2)
Chloride: 104 mmol/L (ref 96–106)
Creatinine, Ser: 1.05 mg/dL (ref 0.76–1.27)
GFR calc Af Amer: 89 mL/min/{1.73_m2} (ref >59)
GFR calc non Af Amer: 77 mL/min/{1.73_m2} (ref >59)
Glucose: 86 mg/dL (ref 65–99)
Potassium: 4.9 mmol/L (ref 3.5–5.2)
Sodium: 140 mmol/L (ref 134–144)
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
## 2019-11-06 ENCOUNTER — Other Ambulatory Visit: Payer: Self-pay

## 2019-11-06 ENCOUNTER — Encounter: Payer: Self-pay | Admitting: Cardiology

## 2019-11-06 ENCOUNTER — Ambulatory Visit (INDEPENDENT_AMBULATORY_CARE_PROVIDER_SITE_OTHER): Payer: 59 | Admitting: Cardiology

## 2019-11-06 VITALS — BP 116/80 | HR 78 | Ht 74.0 in | Wt 296.0 lb

## 2019-11-06 DIAGNOSIS — I10 Essential (primary) hypertension: Secondary | ICD-10-CM

## 2019-11-06 DIAGNOSIS — E782 Mixed hyperlipidemia: Secondary | ICD-10-CM | POA: Diagnosis not present

## 2019-11-06 DIAGNOSIS — I255 Ischemic cardiomyopathy: Secondary | ICD-10-CM | POA: Diagnosis not present

## 2019-11-06 DIAGNOSIS — Z01810 Encounter for preprocedural cardiovascular examination: Secondary | ICD-10-CM

## 2019-11-06 DIAGNOSIS — Z720 Tobacco use: Secondary | ICD-10-CM | POA: Diagnosis not present

## 2019-11-06 MED ORDER — SPIRONOLACTONE 25 MG PO TABS: 12.5000 mg | ORAL_TABLET | Freq: Every day | ORAL | 3 refills | Status: DC

## 2019-11-06 MED ORDER — CARVEDILOL 6.25 MG PO TABS: 6.2500 mg | ORAL_TABLET | Freq: Two times a day (BID) | ORAL | 3 refills | Status: DC

## 2019-11-06 NOTE — Patient Instructions (Signed)
Medication Instructions:   INCREASE CARVEDILOL TO 6.25 MG TWICE DAILY= 2 OF THE 3.125 MG TABLETS TWICE DAILY  START SPIRONOLACTONE 12.5 MG ONCE DAILY= 1/2 OF THE 25 MG TABLET ONCE DAILY  *If you need a refill on your cardiac medications before your next appointment, please call your pharmacy*   Lab Work:  Your physician recommends that you return for lab work in: ONE WEEK  If you have labs (blood work) drawn today and your tests are completely normal, you will receive your results only by: Marland Kitchen MyChart Message (if you have MyChart) OR . A paper copy in the mail If you have any lab test that is abnormal or we need to change your treatment, we will call you to review the results.   Testing/Procedures:  Your physician has requested that you have an echocardiogram. Echocardiography is a painless test that uses sound waves to create images of your heart. It provides your doctor with information about the size and shape of your heart and how well your heart's chambers and valves are working. This procedure takes approximately one hour. There are no restrictions for this procedure.[address]-SCHEDULE IN 3 TO 4 MONTHS     Follow-Up: At [facility], you and your health needs are our priority.  As part of our continuing mission to provide you with exceptional heart care, we have created designated Provider Care Teams.  These Care Teams include your primary Cardiologist (physician) and Advanced Practice Providers (APPs -  Physician Assistants and Nurse Practitioners) who all work together to provide you with the care you need, when you need it.  We recommend signing up for the patient portal called "MyChart".  Sign up information is provided on this After Visit Summary.  MyChart is used to connect with patients for Virtual Visits (Telemedicine).  Patients are able to view lab/test results, encounter notes, upcoming appointments, etc.  Non-urgent messages can be sent to your provider as  well.   To learn more about what you can do with MyChart, go to [email or web address].    Your next appointment:   4 month(s)  The format for your next appointment:   In Person  Provider:   Olga Millers, MD ONCE ECHO COMPLETE

## 2019-11-08 ENCOUNTER — Telehealth: Payer: Self-pay | Admitting: *Deleted

## 2019-11-08 NOTE — Telephone Encounter (Signed)
   Primary Cardiologist: Olga Millers, MD  Chart reviewed as part of pre-operative protocol coverage. He was evaluated by Dr. Jens Som 11/06/19 and granted preoperative clearance for upcoming hip surgery. Given past medical history and time since last visit, based on ACC/AHA guidelines, ABDINASIR SPADAFORE would be at acceptable risk for the planned procedure without further cardiovascular testing.   I will route this recommendation and Dr. Ludwig Clarks note from 11/06/19 to the requesting party via Epic fax function and remove from pre-op pool.  Please call with questions.  Beatriz Stallion, PA-C 11/08/2019, 3:34 PM

## 2019-11-08 NOTE — Telephone Encounter (Signed)
.     [facility] Pre-operative Risk Assessment    HEARTCARE STAFF: - Please ensure there is not already an duplicate clearance open for this procedure. - Under Visit Info/Reason for Call, type in Other and utilize the format Clearance MM/DD/YY or Clearance TBD. Do not use dashes or single digits. - If request is for dental extraction, please clarify the # of teeth to be extracted.  Request for surgical clearance:  1. What type of surgery is being performed? RIGHT TOTAL HIP ARTHROPLASTY   2. When is this surgery scheduled? TBD   3. What type of clearance is required (medical clearance vs. Pharmacy clearance to hold med vs. Both)? MEDICAL  4. Are there any medications that need to be held prior to surgery and how long? ASA    5. Practice name and name of physician performing surgery? [facility]; DR. Rod Can   6. What is the office phone number? [phone]   7.   What is the office fax number? [phone]  8.   Anesthesia type (None, local, MAC, general) ? SPINAL   Julaine Hua 11/08/2019, 12:43 PM  _________________________________________________________________   (provider comments below)

## 2019-11-12 ENCOUNTER — Ambulatory Visit: Payer: 59 | Admitting: Cardiology

## 2020-01-22 ENCOUNTER — Other Ambulatory Visit: Payer: Self-pay

## 2020-01-22 ENCOUNTER — Ambulatory Visit (HOSPITAL_COMMUNITY): Payer: Medicaid Other | Attending: Cardiovascular Disease

## 2020-01-22 DIAGNOSIS — I255 Ischemic cardiomyopathy: Secondary | ICD-10-CM | POA: Insufficient documentation

## 2020-01-22 LAB — ECHOCARDIOGRAM COMPLETE
Area-P 1/2: 3.26 cm2
S' Lateral: 5.7 cm
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
## 2020-02-18 ENCOUNTER — Ambulatory Visit: Payer: 59 | Admitting: Cardiology

## 2020-02-26 ENCOUNTER — Ambulatory Visit: Payer: Self-pay | Admitting: Student

## 2020-02-27 ENCOUNTER — Telehealth: Payer: Self-pay

## 2020-02-27 NOTE — Telephone Encounter (Signed)
   [facility] Pre-operative Risk Assessment    HEARTCARE STAFF: - Please ensure there is not already an duplicate clearance open for this procedure. - Under Visit Info/Reason for Call, type in Other and utilize the format Clearance MM/DD/YY or Clearance TBD. Do not use dashes or single digits. - If request is for dental extraction, please clarify the # of teeth to be extracted.  Request for surgical clearance:  1. What type of surgery is being performed? Right total hip arthroplasty   2. When is this surgery scheduled? 03/12/20   3. What type of clearance is required (medical clearance vs. Pharmacy clearance to hold med vs. Both)?  BOTH  4. Are there any medications that need to be held prior to surgery and how long? ASA, Please advise on holding  5. Practice name and name of physician performing surgery? [facility], Dr. Aaron Edelman Swinteck  6. What is the office phone number? [phone]   7.   What is the office fax number? [phone] Attn: Glendale Chard  8.   Anesthesia type (None, local, MAC, general) ? Spinal   Frank Flowers 02/27/2020, 2:45 PM  _________________________________________________________________   (provider comments below)

## 2020-02-27 NOTE — Telephone Encounter (Signed)
     Primary Cardiologist: Olga Millers, MD  Chart reviewed as part of pre-operative protocol coverage.   Hx:  CAD (Inf MI, s/p DES to RCA, cath in 8/21 w/ patent RCA stent and non-obs dz), HFrEF, HTN. Echocardiogram 01/22/20: EF 35-40, no significant valve dz.  Last seen by Dr. Jens Som:  11/06/19  RCRI:  Perioperative Risk of Major Cardiac Event is (%): 6.6 (high risk) DASI:  Functional Capacity in METs is: 5.07 (functional status is good )  Patient was contacted 02/27/2020 in reference to pre-operative risk assessment for pending surgery as outlined below.    Since last seen, Frank Flowers has done well without chest pain, shortness of breath, orthopnea or syncope.  Recommendations:  Therefore, based on ACC/AHA guidelines, the patient would be at acceptable risk for the planned procedure without further cardiovascular testing.   Ideally, the patient should remain on aspirin without interruption to reduce risk of perioperative CV events.  If it is felt that the bleeding risk is too great, aspirin could be held for 5-7 days and then resumed soon as possible after surgery once the bleeding risk is low.   Please call with questions. Tereso Newcomer, PA-C 02/27/2020, 6:16 PM

## 2020-02-27 NOTE — Telephone Encounter (Signed)
Notes faxed to surgeon. This phone note will be removed from the preop pool. Tereso Newcomer, PA-C  02/27/2020 6:19 PM

## 2020-02-28 NOTE — Patient Instructions (Signed)
DUE TO COVID-19 ONLY ONE VISITOR IS ALLOWED TO COME WITH YOU AND STAY IN THE WAITING ROOM ONLY DURING PRE OP AND PROCEDURE DAY OF SURGERY. THE 1 VISITOR  MAY VISIT WITH YOU AFTER SURGERY IN YOUR PRIVATE ROOM DURING VISITING HOURS ONLY!  YOU NEED TO HAVE A COVID 19 TEST ON_2-26______ @_______ , THIS TEST MUST BE DONE BEFORE SURGERY,  COVID TESTING SITE [address] [place] , IT IS ON THE RIGHT GOING OUT [address] APPROXIMATELY  2 MINUTES PAST [employer] ON THE RIGHT. ONCE YOUR COVID TEST IS COMPLETED,  PLEASE BEGIN THE QUARANTINE INSTRUCTIONS AS OUTLINED IN YOUR HANDOUT.                Frank Flowers  02/28/2020   Your procedure is scheduled on: 03-12-20   Report to [facility] Main  Entrance   Report to admitting at        0845 AM     Call this number if you have problems the morning of surgery [phone]    Remember: NO SOLID FOOD AFTER MIDNIGHT THE NIGHT PRIOR TO SURGERY. NOTHING BY MOUTH EXCEPT CLEAR LIQUIDS UNTIL  0815 am . PLEASE FINISH ENSURE DRINK PER SURGEON ORDER  WHICH NEEDS TO BE COMPLETED AT       0815 am then nothing by mouth .     CLEAR LIQUID DIET   Foods Allowed                                                                      Black Coffee and tea, regular and decaf                            Plain Jell-O any favor except red or purple                                            Fruit ices (not with fruit pulp)                                      Iced Popsicles                                  Carbonated beverages, regular and diet                                    Cranberry, grape and apple juices Sports drinks like Gatorade Lightly seasoned clear broth or consume(fat free) Sugar, honey syrup   BRUSH YOUR TEETH MORNING OF SURGERY AND RINSE YOUR MOUTH OUT, NO CHEWING GUM CANDY OR MINTS.     Take these medicines the morning of surgery with A SIP OF WATER: gabapentin, carvedilol,inhaler and bring it with you  You may not have any metal on your body including hair pins and              piercings  Do not wear jewelry,, lotions, powders or perfumes, deodorant         .              Men may shave face and neck.   Do not bring valuables to the hospital.  IS NOT             RESPONSIBLE   FOR VALUABLES.  Contacts, dentures or bridgework may not be worn into surgery.      Patients discharged the day of surgery will not be allowed to drive home. IF YOU ARE HAVING SURGERY AND GOING HOME THE SAME DAY, YOU MUST HAVE AN ADULT TO DRIVE YOU HOME AND BE WITH YOU FOR 24 HOURS. YOU MAY GO HOME BY TAXI OR UBER OR ORTHERWISE, BUT AN ADULT MUST ACCOMPANY YOU HOME AND STAY WITH YOU FOR 24 HOURS.  Name and phone number of your driver:  Special Instructions: N/A              Please read over the following fact sheets you were given: _____________________________________________________________________             [facility] - Preparing for Surgery Before surgery, you can play an important role.  Because skin is not sterile, your skin needs to be as free of germs as possible.  You can reduce the number of germs on your skin by washing with CHG (chlorahexidine gluconate) soap before surgery.  CHG is an antiseptic cleaner which kills germs and bonds with the skin to continue killing germs even after washing. Please DO NOT use if you have an allergy to CHG or antibacterial soaps.  If your skin becomes reddened/irritated stop using the CHG and inform your nurse when you arrive at Short Stay. Do not shave (including legs and underarms) for at least 48 hours prior to the first CHG shower.  You may shave your face/neck. Please follow these instructions carefully:  1.  Shower with CHG Soap the night before surgery and the  morning of Surgery.  2.  If you choose to wash your hair, wash your hair first as usual with your  normal  shampoo.  3.  After you shampoo, rinse your hair and body  thoroughly to remove the  shampoo.                           4.  Use CHG as you would any other liquid soap.  You can apply chg directly  to the skin and wash                       Gently with a scrungie or clean washcloth.  5.  Apply the CHG Soap to your body ONLY FROM THE NECK DOWN.   Do not use on face/ open                           Wound or open sores. Avoid contact with eyes, ears mouth and genitals (private parts).                       Wash face,  Genitals (private parts) with your normal soap.  6.  Wash thoroughly, paying special attention to the area where your surgery  will be performed.  7.  Thoroughly rinse your body with warm water from the neck down.  8.  DO NOT shower/wash with your normal soap after using and rinsing off  the CHG Soap.                9.  Pat yourself dry with a clean towel.            10.  Wear clean pajamas.            11.  Place clean sheets on your bed the night of your first shower and do not  sleep with pets. Day of Surgery : Do not apply any lotions/deodorants the morning of surgery.  Please wear clean clothes to the hospital/surgery center.  FAILURE TO FOLLOW THESE INSTRUCTIONS MAY RESULT IN THE CANCELLATION OF YOUR SURGERY PATIENT SIGNATURE_________________________________  NURSE SIGNATURE__________________________________  ________________________________________________________________________   Frank Flowers  An incentive spirometer is a tool that can help keep your lungs clear and active. This tool measures how well you are filling your lungs with each breath. Taking long deep breaths may help reverse or decrease the chance of developing breathing (pulmonary) problems (especially infection) following:  A long period of time when you are unable to move or be active. BEFORE THE PROCEDURE   If the spirometer includes an indicator to show your best effort, your nurse or respiratory therapist will set it to a desired goal.  If  possible, sit up straight or lean slightly forward. Try not to slouch.  Hold the incentive spirometer in an upright position. INSTRUCTIONS FOR USE  1. Sit on the edge of your bed if possible, or sit up as far as you can in bed or on a chair. 2. Hold the incentive spirometer in an upright position. 3. Breathe out normally. 4. Place the mouthpiece in your mouth and seal your lips tightly around it. 5. Breathe in slowly and as deeply as possible, raising the piston or the ball toward the top of the column. 6. Hold your breath for 3-5 seconds or for as long as possible. Allow the piston or ball to fall to the bottom of the column. 7. Remove the mouthpiece from your mouth and breathe out normally. 8. Rest for a few seconds and repeat Steps 1 through 7 at least 10 times every 1-2 hours when you are awake. Take your time and take a few normal breaths between deep breaths. 9. The spirometer may include an indicator to show your best effort. Use the indicator as a goal to work toward during each repetition. 10. After each set of 10 deep breaths, practice coughing to be sure your lungs are clear. If you have an incision (the cut made at the time of surgery), support your incision when coughing by placing a pillow or rolled up towels firmly against it. Once you are able to get out of bed, walk around indoors and cough well. You may stop using the incentive spirometer when instructed by your caregiver.  RISKS AND COMPLICATIONS  Take your time so you do not get dizzy or light-headed.  If you are in pain, you may need to take or ask for pain medication before doing incentive spirometry. It is harder to take a deep breath if you are having pain. AFTER USE  Rest and breathe slowly and easily.  It can be helpful to keep track of a log of your  progress. Your caregiver can provide you with a simple table to help with this. If you are using the spirometer at home, follow these instructions: [place]  IF:   You are having difficultly using the spirometer.  You have trouble using the spirometer as often as instructed.  Your pain medication is not giving enough relief while using the spirometer.  You develop fever of 100.5 F (38.1 C) or higher. SEEK IMMEDIATE MEDICAL CARE IF:   You cough up bloody sputum that had not been present before.  You develop fever of 102 F (38.9 C) or greater.  You develop worsening pain at or near the incision site. MAKE SURE YOU:   Understand these instructions.  Will watch your condition.  Will get help right away if you are not doing well or get worse. Document Released: 05/10/2006 Document Revised: 03/22/2011 Document Reviewed: 07/11/2006 [facility] Patient Information 2014 [place], [place].   ________________________________________________________________________

## 2020-02-28 NOTE — Progress Notes (Addendum)
PCP - Guadalupe Maple, FNP Cardiologist -Olga Millers, MD lov 11-06-19 clearance 02-27-20 epic  PPM/ICD -  Device Orders -  Rep Notified -   Chest x-ray - 03-27-19 epic EKG - 08-21-19 epic Stress Test -  ECHO - 1-11-222 epic Cath 08-23-19 epic  Sleep Study -  CPAP -   Fasting Blood Sugar -  Checks Blood Sugar _____ times a day  Blood Thinner Instructions: Aspirin Instructions: 81 mg hold 5-7 days prior  ERAS Protcol - PRE-SURGERY Ensure   COVID TEST- 2-26  Activity--Able to walk a flight of stairs without SOB Anesthesia review: CAD stents, MI 2013, CHF  Patient denies shortness of breath, fever, cough and chest pain at PAT appointment  NONE   All instructions explained to the patient, with a verbal understanding of the material. Patient agrees to go over the instructions while at home for a better understanding. Patient also instructed to self quarantine after being tested for COVID-19. The opportunity to ask questions was provided.

## 2020-03-05 ENCOUNTER — Encounter (HOSPITAL_COMMUNITY)
Admission: RE | Admit: 2020-03-05 | Discharge: 2020-03-05 | Disposition: A | Discharge status: Home / Self Care | Payer: Medicaid Other | Source: Ambulatory Visit | Attending: Orthopedic Surgery | Admitting: Orthopedic Surgery

## 2020-03-05 ENCOUNTER — Encounter (HOSPITAL_COMMUNITY): Payer: Self-pay

## 2020-03-05 ENCOUNTER — Other Ambulatory Visit: Payer: Self-pay

## 2020-03-05 DIAGNOSIS — Z955 Presence of coronary angioplasty implant and graft: Secondary | ICD-10-CM | POA: Diagnosis not present

## 2020-03-05 DIAGNOSIS — F17210 Nicotine dependence, cigarettes, uncomplicated: Secondary | ICD-10-CM | POA: Insufficient documentation

## 2020-03-05 DIAGNOSIS — Z79899 Other long term (current) drug therapy: Secondary | ICD-10-CM | POA: Diagnosis not present

## 2020-03-05 DIAGNOSIS — M1611 Unilateral primary osteoarthritis, right hip: Secondary | ICD-10-CM | POA: Diagnosis not present

## 2020-03-05 DIAGNOSIS — I2510 Atherosclerotic heart disease of native coronary artery without angina pectoris: Secondary | ICD-10-CM | POA: Diagnosis not present

## 2020-03-05 DIAGNOSIS — Z01812 Encounter for preprocedural laboratory examination: Secondary | ICD-10-CM | POA: Diagnosis not present

## 2020-03-05 DIAGNOSIS — I10 Essential (primary) hypertension: Secondary | ICD-10-CM | POA: Insufficient documentation

## 2020-03-05 DIAGNOSIS — Z7982 Long term (current) use of aspirin: Secondary | ICD-10-CM | POA: Diagnosis not present

## 2020-03-05 DIAGNOSIS — Z791 Long term (current) use of non-steroidal anti-inflammatories (NSAID): Secondary | ICD-10-CM | POA: Diagnosis not present

## 2020-03-05 DIAGNOSIS — I255 Ischemic cardiomyopathy: Secondary | ICD-10-CM | POA: Diagnosis not present

## 2020-03-05 HISTORY — DX: Anxiety disorder, unspecified: F41.9

## 2020-03-05 HISTORY — DX: Heart failure, unspecified: I50.9

## 2020-03-05 LAB — URINALYSIS, ROUTINE W REFLEX MICROSCOPIC
Bilirubin Urine: NEGATIVE
Glucose, UA: NEGATIVE mg/dL
Ketones, ur: NEGATIVE mg/dL
Leukocytes,Ua: NEGATIVE
Nitrite: NEGATIVE
Protein, ur: NEGATIVE mg/dL
Specific Gravity, Urine: 1.023 (ref 1.005–1.030)
pH: 5 (ref 5.0–8.0)

## 2020-03-05 LAB — COMPREHENSIVE METABOLIC PANEL
ALT: 13 U/L (ref 0–44)
AST: 20 U/L (ref 15–41)
Albumin: 4.1 g/dL (ref 3.5–5.0)
Alkaline Phosphatase: 57 U/L (ref 38–126)
Anion gap: 11 (ref 5–15)
BUN: 17 mg/dL (ref 6–20)
CO2: 22 mmol/L (ref 22–32)
Calcium: 9 mg/dL (ref 8.9–10.3)
Chloride: 106 mmol/L (ref 98–111)
Creatinine, Ser: 0.97 mg/dL (ref 0.61–1.24)
GFR, Estimated: >60 mL/min (ref >60)
Glucose, Bld: 100 mg/dL — ABNORMAL HIGH (ref 70–99)
Potassium: 3.7 mmol/L (ref 3.5–5.1)
Sodium: 139 mmol/L (ref 135–145)
Total Bilirubin: 0.6 mg/dL (ref 0.3–1.2)
Total Protein: 8.3 g/dL — ABNORMAL HIGH (ref 6.5–8.1)

## 2020-03-05 LAB — CBC
HCT: 54.2 % — ABNORMAL HIGH (ref 39.0–52.0)
Hemoglobin: 17.6 g/dL — ABNORMAL HIGH (ref 13.0–17.0)
MCH: 29.4 pg (ref 26.0–34.0)
MCHC: 32.5 g/dL (ref 30.0–36.0)
MCV: 90.5 fL (ref 80.0–100.0)
Platelets: 195 10*3/uL (ref 150–400)
RBC: 5.99 MIL/uL — ABNORMAL HIGH (ref 4.22–5.81)
RDW: 15.3 % (ref 11.5–15.5)
WBC: 7.2 10*3/uL (ref 4.0–10.5)
nRBC: 0 % (ref 0.0–0.2)

## 2020-03-05 LAB — PROTIME-INR
INR: 1 (ref 0.8–1.2)
Prothrombin Time: 12.3 seconds (ref 11.4–15.2)

## 2020-03-05 LAB — SURGICAL PCR SCREEN
MRSA, PCR: NEGATIVE
Staphylococcus aureus: NEGATIVE

## 2020-03-06 ENCOUNTER — Encounter (HOSPITAL_COMMUNITY): Payer: Self-pay | Admitting: Physician Assistant

## 2020-03-06 NOTE — Anesthesia Preprocedure Evaluation (Deleted)
Anesthesia Evaluation    Airway        Dental   Pulmonary Current Smoker and Patient abstained from smoking.,           Cardiovascular hypertension,      Neuro/Psych    GI/Hepatic   Endo/Other    Renal/GU      Musculoskeletal   Abdominal   Peds  Hematology   Anesthesia Other Findings   Reproductive/Obstetrics                             Anesthesia Physical Anesthesia Plan  ASA:   Anesthesia Plan:    Post-op Pain Management:    Induction:   PONV Risk Score and Plan:   Airway Management Planned:   Additional Equipment:   Intra-op Plan:   Post-operative Plan:   Informed Consent:   Plan Discussed with:   Anesthesia Plan Comments: (See PAT note 03/05/20, Jodell Cipro, PA-C)        Anesthesia Quick Evaluation
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
## 2020-03-08 ENCOUNTER — Other Ambulatory Visit (HOSPITAL_COMMUNITY): Admission: RE | Admit: 2020-03-08 | Payer: Medicaid Other | Source: Ambulatory Visit

## 2020-03-12 ENCOUNTER — Encounter (HOSPITAL_COMMUNITY): Admission: RE | Payer: Self-pay | Source: Home / Self Care

## 2020-03-12 ENCOUNTER — Ambulatory Visit (HOSPITAL_COMMUNITY): Admission: RE | Admit: 2020-03-12 | Payer: Medicaid Other | Source: Home / Self Care | Admitting: Orthopedic Surgery

## 2020-03-12 LAB — TYPE AND SCREEN
ABO/RH(D): A POS
Antibody Screen: NEGATIVE

## 2020-03-12 SURGERY — ARTHROPLASTY, HIP, TOTAL, ANTERIOR APPROACH
Anesthesia: Spinal | Site: Hip | Laterality: Right

## 2020-04-11 ENCOUNTER — Emergency Department (HOSPITAL_COMMUNITY)
Admission: EM | Admit: 2020-04-11 | Discharge: 2020-04-11 | Disposition: A | Discharge status: Home / Self Care | Payer: Medicaid Other | Attending: Emergency Medicine | Admitting: Emergency Medicine

## 2020-04-11 ENCOUNTER — Emergency Department (HOSPITAL_COMMUNITY): Payer: Medicaid Other

## 2020-04-11 DIAGNOSIS — F17210 Nicotine dependence, cigarettes, uncomplicated: Secondary | ICD-10-CM | POA: Insufficient documentation

## 2020-04-11 DIAGNOSIS — I509 Heart failure, unspecified: Secondary | ICD-10-CM | POA: Insufficient documentation

## 2020-04-11 DIAGNOSIS — I110 Hypertensive heart disease with heart failure: Secondary | ICD-10-CM | POA: Insufficient documentation

## 2020-04-11 DIAGNOSIS — Z20822 Contact with and (suspected) exposure to covid-19: Secondary | ICD-10-CM | POA: Diagnosis not present

## 2020-04-11 DIAGNOSIS — R0602 Shortness of breath: Secondary | ICD-10-CM | POA: Insufficient documentation

## 2020-04-11 DIAGNOSIS — I2510 Atherosclerotic heart disease of native coronary artery without angina pectoris: Secondary | ICD-10-CM | POA: Insufficient documentation

## 2020-04-11 DIAGNOSIS — Z955 Presence of coronary angioplasty implant and graft: Secondary | ICD-10-CM | POA: Insufficient documentation

## 2020-04-11 LAB — CBC WITH DIFFERENTIAL/PLATELET
Abs Immature Granulocytes: 0.03 10*3/uL (ref 0.00–0.07)
Basophils Absolute: 0.1 10*3/uL (ref 0.0–0.1)
Basophils Relative: 1 %
Eosinophils Absolute: 0.2 10*3/uL (ref 0.0–0.5)
Eosinophils Relative: 1 %
HCT: 53.3 % — ABNORMAL HIGH (ref 39.0–52.0)
Hemoglobin: 17.5 g/dL — ABNORMAL HIGH (ref 13.0–17.0)
Immature Granulocytes: 0 %
Lymphocytes Relative: 19 %
Lymphs Abs: 2 10*3/uL (ref 0.7–4.0)
MCH: 30.1 pg (ref 26.0–34.0)
MCHC: 32.8 g/dL (ref 30.0–36.0)
MCV: 91.6 fL (ref 80.0–100.0)
Monocytes Absolute: 1.1 10*3/uL — ABNORMAL HIGH (ref 0.1–1.0)
Monocytes Relative: 10 %
Neutro Abs: 7.3 10*3/uL (ref 1.7–7.7)
Neutrophils Relative %: 69 %
Platelets: 186 10*3/uL (ref 150–400)
RBC: 5.82 MIL/uL — ABNORMAL HIGH (ref 4.22–5.81)
RDW: 15.3 % (ref 11.5–15.5)
WBC: 10.6 10*3/uL — ABNORMAL HIGH (ref 4.0–10.5)
nRBC: 0 % (ref 0.0–0.2)

## 2020-04-11 LAB — RESP PANEL BY RT-PCR (FLU A&B, COVID) ARPGX2
Influenza A by PCR: NEGATIVE
Influenza B by PCR: NEGATIVE
SARS Coronavirus 2 by RT PCR: NEGATIVE

## 2020-04-11 LAB — COMPREHENSIVE METABOLIC PANEL
ALT: 13 U/L (ref 0–44)
AST: 18 U/L (ref 15–41)
Albumin: 3.5 g/dL (ref 3.5–5.0)
Alkaline Phosphatase: 54 U/L (ref 38–126)
Anion gap: 8 (ref 5–15)
BUN: 14 mg/dL (ref 6–20)
CO2: 27 mmol/L (ref 22–32)
Calcium: 8.9 mg/dL (ref 8.9–10.3)
Chloride: 102 mmol/L (ref 98–111)
Creatinine, Ser: 1.04 mg/dL (ref 0.61–1.24)
GFR, Estimated: >60 mL/min (ref >60)
Glucose, Bld: 97 mg/dL (ref 70–99)
Potassium: 3.8 mmol/L (ref 3.5–5.1)
Sodium: 137 mmol/L (ref 135–145)
Total Bilirubin: 1 mg/dL (ref 0.3–1.2)
Total Protein: 7.4 g/dL (ref 6.5–8.1)

## 2020-04-11 LAB — TROPONIN I (HIGH SENSITIVITY)
Troponin I (High Sensitivity): 11 ng/L (ref <18)
Troponin I (High Sensitivity): 12 ng/L (ref <18)

## 2020-04-11 LAB — BRAIN NATRIURETIC PEPTIDE: B Natriuretic Peptide: 56.3 pg/mL (ref 0.0–100.0)

## 2020-04-11 NOTE — ED Notes (Signed)
Pt ambulated in room with steady gait. O2 maintained 98%. Pt did not become SOB and not distress noted.

## 2020-04-11 NOTE — ED Triage Notes (Signed)
Pt arrives via EMS from home with complaints of SOB. Pt has history of CHF. Unable to take entresto due to insurance issues X 1-2 weeks.  Per EMS pt was SOB on exertion which is pt baseline. Pt does not use supplemental O2 98% RA  90 HR 160/128

## 2020-04-11 NOTE — ED Notes (Signed)
Radiology at bedside
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
## 2020-04-11 NOTE — Discharge Instructions (Signed)
Were seen in the emergency department today with trouble breathing which has resolved.  Your lab work is reassuring and normal.  Your chest x-ray is normal.  Please call your cardiology office to ask them about the prior authorization required for your Entresto.  Please follow closely with your cardiologist as well as your primary care doctor, ideally within the next week, to review your ED visit and symptoms.  If you develop any new or suddenly worsening symptoms please return to the emergency department immediately for reevaluation.

## 2020-04-15 ENCOUNTER — Telehealth: Payer: Self-pay | Admitting: General Practice

## 2020-04-15 NOTE — Telephone Encounter (Signed)
Spoke to patient . He states he has been out of medication from 2 to 3 weeks.  Patient states pharmacy informed him they would be sending Information to the office.   Patient  Has insurance with Medicaid .   Possible need prior authorization -  The process to be started.  Patient was informed in the future to contact office with any medication issue. Patient voice understanding.

## 2020-04-15 NOTE — Telephone Encounter (Signed)
Pt c/o medication issue:  1. Name of Medication: sacubitril-valsartan (ENTRESTO) 49-51 MG  2. How are you currently taking this medication (dosage and times per day)? Has not taken in the past two weeks   3. Are you having a reaction (difficulty breathing--STAT)? No   4. What is your medication issue? Frank Flowers is calling stating Frank Flowers has not taken this medication in at least the past two weeks due to the pharmacy advising him it is not covered by his insurance. Please advise.

## 2020-04-18 ENCOUNTER — Other Ambulatory Visit: Payer: Self-pay

## 2020-04-18 MED ORDER — ENTRESTO 49-51 MG PO TABS: 1.0000 | ORAL_TABLET | Freq: Two times a day (BID) | ORAL | 6 refills | Status: DC
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
## 2020-04-25 ENCOUNTER — Telehealth: Payer: Self-pay | Admitting: *Deleted

## 2020-04-25 NOTE — Telephone Encounter (Signed)
   [facility] Pre-operative Risk Assessment    Patient Name: Frank Flowers  DOB: July 29, 1960  MRN: [id number]     Request for surgical clearance:  1. What type of surgery is being performed?  Rt total hip arthroplasty   2. When is this surgery scheduled? 05/15/20  3. What type of clearance is required (medical clearance vs. Pharmacy clearance to hold med vs. Both)?medical   4. Are there any medications that need to be held prior to surgery and how long? Aspirin 81 mg  5. Practice name and name of physician performing surgery? [facility]; Dr Aaron Edelman Sw[place]eck  6. What is the office phone number?  [place].   What is the office fax number? [phone] attn kelly hancock  8.   Anesthesia type (None, local, MAC, general) ? spinal   Raiford Simmonds 04/25/2020, 6:18 PM  _________________________________________________________________   (provider comments below)

## 2020-04-28 ENCOUNTER — Ambulatory Visit: Payer: Self-pay | Admitting: Student

## 2020-04-28 ENCOUNTER — Telehealth: Payer: Self-pay | Admitting: Cardiology

## 2020-04-28 NOTE — Telephone Encounter (Signed)
[facility] was calling to check on the status of the patient's Prior Auth for sacubitril-valsartan (ENTRESTO) 49-51 MG. The pharmacy states they have faxed the office multiple times with no response.  Please reach out to the Pharmacy

## 2020-04-28 NOTE — Telephone Encounter (Signed)
Will route to [place] to see if she is aware of anything? I have no personally seen any faxes come through, Stanton Kidney has been off all week last week, can you please take a look into this?

## 2020-04-28 NOTE — Telephone Encounter (Signed)
Frank Flowers- did you start the process for this PA?  Frank Flowers said the process to be started, but I am unaware.  Thank you!

## 2020-04-28 NOTE — Telephone Encounter (Signed)
Patient has a history of CAD, ischemic cardiomyopathy and hypertension.  Previous echocardiogram in July 2021 showed EF 25 to 30%, mild MR.  Cardiac catheterization in August 2021 showed patent stent in the distal RCA, otherwise nonobstructive CAD.  He was last seen by Dr. Jens Som in October 2021. Patient's case was previously reviewed in February, he was cleared to proceed with right total hip surgery.  However recently, he went to the ED on 04/11/2020 due to worsening dyspnea.  Serial enzyme was negative.  Apparently he has ran out of [facility] for several weeks prior to the ED visit.  He was instructed to follow-up with Dr. Jens Som closely.  He is scheduled to see Dr. Jens Som on 05/01/2020.  Will defer to MD to reassess the patient to see if he is fit to proceed with right hip surgery.

## 2020-05-01 ENCOUNTER — Ambulatory Visit (INDEPENDENT_AMBULATORY_CARE_PROVIDER_SITE_OTHER): Payer: Medicaid Other | Admitting: Cardiology

## 2020-05-01 ENCOUNTER — Encounter: Payer: Self-pay | Admitting: Cardiology

## 2020-05-01 ENCOUNTER — Other Ambulatory Visit: Payer: Self-pay

## 2020-05-01 VITALS — BP 122/80 | HR 80 | Ht 74.0 in | Wt 297.2 lb

## 2020-05-01 DIAGNOSIS — Z720 Tobacco use: Secondary | ICD-10-CM

## 2020-05-01 DIAGNOSIS — I2510 Atherosclerotic heart disease of native coronary artery without angina pectoris: Secondary | ICD-10-CM | POA: Diagnosis not present

## 2020-05-01 DIAGNOSIS — E782 Mixed hyperlipidemia: Secondary | ICD-10-CM

## 2020-05-01 DIAGNOSIS — I10 Essential (primary) hypertension: Secondary | ICD-10-CM

## 2020-05-01 DIAGNOSIS — Z01810 Encounter for preprocedural cardiovascular examination: Secondary | ICD-10-CM

## 2020-05-01 NOTE — Patient Instructions (Signed)

## 2020-05-01 NOTE — Telephone Encounter (Signed)
Patient was seen and cleared for surgery by Dr. Jens Som this morning.  I have forwarded Dr. Ludwig Clarks note back to the surgeon's office.

## 2020-05-07 NOTE — Progress Notes (Signed)
COVID Vaccine Completed: x3 Date COVID Vaccine completed:  04-10-19 & 05-08-19 Has received booster:  02-14-20 COVID vaccine manufacturer:    Moderna    Date of COVID positive in last 90 days:  PCP - [facility] Cardiologist - Olga Millers, MD  Cardiac clearance in note dated 05-01-20 by Dr. Olga Millers  Chest x-ray - 04-11-20 Epic EKG - 04-14-20 Epic Stress Test - 04-06-19 Epic ECHO - 01-22-20 Epic Cardiac Cath - 08-23-19 Epic Pacemaker/ICD device last checked: Spinal Cord Stimulator:  Sleep Study -  CPAP -   Fasting Blood Sugar -  Checks Blood Sugar _____ times a day  Blood Thinner Instructions: Aspirin Instructions:  ASA 81 mg  Last Dose:  Activity level:  Can go up a flight of stairs and perform activities of daily living without stopping and without symptoms of chest pain or shortness of breath.   Able to exercise without symptoms  Unable to go up a flight of stairs without symptoms of      Anesthesia review:  Hx of MI with stent,  CAD, CHF, cardiomyopathy, HTN  Seen in ED 04-11-20 for SOB   Patient denies shortness of breath, fever, cough and chest pain at PAT appointment   Patient verbalized understanding of instructions that were given to them at the PAT appointment. Patient was also instructed that they will need to review over the PAT instructions again at home before surgery.

## 2020-05-07 NOTE — Patient Instructions (Signed)
DUE TO COVID-19 ONLY ONE VISITOR IS ALLOWED TO COME WITH YOU AND STAY IN THE WAITING ROOM ONLY DURING PRE OP AND PROCEDURE.   **NO VISITORS ARE ALLOWED IN THE SHORT STAY AREA OR RECOVERY ROOM!!**  IF YOU WILL BE ADMITTED INTO THE HOSPITAL YOU ARE ALLOWED ONLY TWO SUPPORT PEOPLE DURING VISITATION HOURS ONLY (10AM -8PM)   . The support person(s) may change daily. . The support person(s) must pass our screening, gel in and out, and wear a mask at all times, including in the patient's room. . Patients must also wear a mask when staff or their support person are in the room.  No visitors under the age of 37. Any visitor under the age of 55 must be accompanied by an adult.    COVID SWAB TESTING MUST BE COMPLETED ON:  Tuesday, 05-12-20 @    [address] [place], [place] [address]  (Must self quarantine after testing. Follow instructions on handout.)        Your procedure is scheduled on:  Thursday, 05-15-20   Report to [facility] Main  Entrance   Report to admitting at 7:50 AM   Call this number if you have problems the morning of surgery [phone]   Do not eat food :After Midnight.   May have liquids until 7:15 AM   day of surgery  CLEAR LIQUID DIET  Foods Allowed                                                                     Foods Excluded  Water, Black Coffee and tea, regular and decaf              liquids that you cannot  Plain Jell-O in any flavor  (No red)                                     see through such as: Fruit ices (not with fruit pulp)                                      milk, soups, orange juice              Iced Popsicles (No red)                                      All solid food                                   Apple juices Sports drinks like Gatorade (No red) Lightly seasoned clear broth or consume(fat free) Sugar, honey syrup    Complete one Ensure drink the morning of surgery at  7:15 AM  the day of surgery.      1. The day of surgery:   ? Drink ONE (1) Pre-Surgery Clear Ensure or G2 by am the morning of surgery. Drink in one sitting. Do not sip.  ? This drink was  given to you during your hospital  pre-op appointment visit. ? Nothing else to drink after completing the  Pre-Surgery Clear Ensure or G2.          If you have questions, please contact your surgeon's office.     Oral Hygiene is also important to reduce your risk of infection.                                    Remember - BRUSH YOUR TEETH THE MORNING OF SURGERY WITH YOUR REGULAR TOOTHPASTE   Do NOT smoke after Midnight   Take these medicines the morning of surgery with A SIP OF WATER: Albuterol inhaler, Carvediolol, Gabapentin                                You may not have any metal on your body including  jewelry, and body piercings             Do not wear  lotions, powders, cologne, or deodorant             Men may shave face and neck.   Do not bring valuables to the hospital. [facility] IS NOT RESPONSIBLE   FOR VALUABLES.   Contacts, dentures or bridgework may not be worn into surgery.   Bring small overnight bag day of surgery.    Special Instructions: Bring a copy of your healthcare power of attorney and living will documents         the day of surgery if you haven't scanned them in before.              Please read over the following fact sheets you were given: IF YOU HAVE QUESTIONS ABOUT YOUR PRE OP INSTRUCTIONS PLEASE CALL [phone]   [facility] - Preparing for Surgery Before surgery, you can play an important role.  Because skin is not sterile, your skin needs to be as free of germs as possible.  You can reduce the number of germs on your skin by washing with CHG (chlorahexidine gluconate) soap before surgery.  CHG is an antiseptic cleaner which kills germs and bonds with the skin to continue killing germs even after washing. Please DO NOT use if you have an allergy to CHG or antibacterial soaps.  If your skin becomes reddened/irritated  stop using the CHG and inform your nurse when you arrive at Short Stay. Do not shave (including legs and underarms) for at least 48 hours prior to the first CHG shower.  You may shave your face/neck.  Please follow these instructions carefully:  1.  Shower with CHG Soap the night before surgery and the  morning of surgery.  2.  If you choose to wash your hair, wash your hair first as usual with your normal  shampoo.  3.  After you shampoo, rinse your hair and body thoroughly to remove the shampoo.                             4.  Use CHG as you would any other liquid soap.  You can apply chg directly to the skin and wash.  Gently with a scrungie or clean washcloth.  5.  Apply the CHG Soap to your body ONLY FROM THE NECK DOWN.   Do   not use on  face/ open                           Wound or open sores. Avoid contact with eyes, ears mouth and   genitals (private parts).                       Wash face,  Genitals (private parts) with your normal soap.             6.  Wash thoroughly, paying special attention to the area where your    surgery  will be performed.  7.  Thoroughly rinse your body with warm water from the neck down.  8.  DO NOT shower/wash with your normal soap after using and rinsing off the CHG Soap.                9.  Pat yourself dry with a clean towel.            10.  Wear clean pajamas.            11.  Place clean sheets on your bed the night of your first shower and do not  sleep with pets. Day of Surgery : Do not apply any lotions/deodorants the morning of surgery.  Please wear clean clothes to the hospital/surgery center.  FAILURE TO FOLLOW THESE INSTRUCTIONS MAY RESULT IN THE CANCELLATION OF YOUR SURGERY  PATIENT SIGNATURE_________________________________  NURSE SIGNATURE__________________________________  ________________________________________________________________________   Frank Flowers  An incentive spirometer is a tool that can help keep your lungs  clear and active. This tool measures how well you are filling your lungs with each breath. Taking long deep breaths may help reverse or decrease the chance of developing breathing (pulmonary) problems (especially infection) following:  A long period of time when you are unable to move or be active. BEFORE THE PROCEDURE   If the spirometer includes an indicator to show your best effort, your nurse or respiratory therapist will set it to a desired goal.  If possible, sit up straight or lean slightly forward. Try not to slouch.  Hold the incentive spirometer in an upright position. INSTRUCTIONS FOR USE  1. Sit on the edge of your bed if possible, or sit up as far as you can in bed or on a chair. 2. Hold the incentive spirometer in an upright position. 3. Breathe out normally. 4. Place the mouthpiece in your mouth and seal your lips tightly around it. 5. Breathe in slowly and as deeply as possible, raising the piston or the ball toward the top of the column. 6. Hold your breath for 3-5 seconds or for as long as possible. Allow the piston or ball to fall to the bottom of the column. 7. Remove the mouthpiece from your mouth and breathe out normally. 8. Rest for a few seconds and repeat Steps 1 through 7 at least 10 times every 1-2 hours when you are awake. Take your time and take a few normal breaths between deep breaths. 9. The spirometer may include an indicator to show your best effort. Use the indicator as a goal to work toward during each repetition. 10. After each set of 10 deep breaths, practice coughing to be sure your lungs are clear. If you have an incision (the cut made at the time of surgery), support your incision when coughing by placing a pillow or rolled up towels firmly against it. Once you are able to get out  of bed, walk around indoors and cough well. You may stop using the incentive spirometer when instructed by your caregiver.  RISKS AND COMPLICATIONS  Take your time so you do  not get dizzy or light-headed.  If you are in pain, you may need to take or ask for pain medication before doing incentive spirometry. It is harder to take a deep breath if you are having pain. AFTER USE  Rest and breathe slowly and easily.  It can be helpful to keep track of a log of your progress. Your caregiver can provide you with a simple table to help with this. If you are using the spirometer at home, follow these instructions: SEEK MEDICAL CARE IF:   You are having difficultly using the spirometer.  You have trouble using the spirometer as often as instructed.  Your pain medication is not giving enough relief while using the spirometer.  You develop fever of 100.5 F (38.1 C) or higher. SEEK IMMEDIATE MEDICAL CARE IF:   You cough up bloody sputum that had not been present before.  You develop fever of 102 F (38.9 C) or greater.  You develop worsening pain at or near the incision site. MAKE SURE YOU:   Understand these instructions.  Will watch your condition.  Will get help right away if you are not doing well or get worse. Document Released: 05/10/2006 Document Revised: 03/22/2011 Document Reviewed: 07/11/2006 Exit[employer] Patient Information 2014 [employer], [place].   ________________________________________________________________________  WHAT IS A BLOOD TRANSFUSION? Blood Transfusion Information  A transfusion is the replacement of blood or some of its parts. Blood is made up of multiple cells which provide different functions.  Red blood cells carry oxygen and are used for blood loss replacement.  White blood cells fight against infection.  Platelets control bleeding.  Plasma helps clot blood.  Other blood products are available for specialized needs, such as hemophilia or other clotting disorders. BEFORE THE TRANSFUSION  Who gives blood for transfusions?   Healthy volunteers who are fully evaluated to make sure their blood is safe. This is blood bank  blood. Transfusion therapy is the safest it has ever been in the practice of medicine. Before blood is taken from a donor, a complete history is taken to make sure that person has no history of diseases nor engages in risky social behavior (examples are intravenous drug use or sexual activity with multiple partners). The donor's travel history is screened to minimize risk of transmitting infections, such as malaria. The donated blood is tested for signs of infectious diseases, such as HIV and hepatitis. The blood is then tested to be sure it is compatible with you in order to minimize the chance of a transfusion reaction. If you or a relative donates blood, this is often done in anticipation of surgery and is not appropriate for emergency situations. It takes many days to process the donated blood. RISKS AND COMPLICATIONS Although transfusion therapy is very safe and saves many lives, the main dangers of transfusion include:   Getting an infectious disease.  Developing a transfusion reaction. This is an allergic reaction to something in the blood you were given. Every precaution is taken to prevent this. The decision to have a blood transfusion has been considered carefully by your caregiver before blood is given. Blood is not given unless the benefits outweigh the risks. AFTER THE TRANSFUSION  Right after receiving a blood transfusion, you will usually feel much better and more energetic. This is especially true if your red  blood cells have gotten low (anemic). The transfusion raises the level of the red blood cells which carry oxygen, and this usually causes an energy increase.  The nurse administering the transfusion will monitor you carefully for complications. HOME CARE INSTRUCTIONS  No special instructions are needed after a transfusion. You may find your energy is better. Speak with your caregiver about any limitations on activity for underlying diseases you may have. SEEK MEDICAL CARE IF:    Your condition is not improving after your transfusion.  You develop redness or irritation at the intravenous (IV) site. SEEK IMMEDIATE MEDICAL CARE IF:  Any of the following symptoms occur over the next 12 hours:  Shaking chills.  You have a temperature by mouth above 102 F (38.9 C), not controlled by medicine.  Chest, back, or muscle pain.  People around you feel you are not acting correctly or are confused.  Shortness of breath or difficulty breathing.  Dizziness and fainting.  You get a rash or develop hives.  You have a decrease in urine output.  Your urine turns a dark color or changes to pink, red, or brown. Any of the following symptoms occur over the next 10 days:  You have a temperature by mouth above 102 F (38.9 C), not controlled by medicine.  Shortness of breath.  Weakness after normal activity.  The white part of the eye turns yellow (jaundice).  You have a decrease in the amount of urine or are urinating less often.  Your urine turns a dark color or changes to pink, red, or brown. Document Released: 12/26/1999 Document Revised: 03/22/2011 Document Reviewed: 08/14/2007 [facility] Patient Information 2014 [place], [place].  _______________________________________________________________________

## 2020-05-08 ENCOUNTER — Encounter (HOSPITAL_COMMUNITY)
Admission: RE | Admit: 2020-05-08 | Discharge: 2020-05-08 | Disposition: A | Discharge status: Home / Self Care | Payer: Medicaid Other | Source: Ambulatory Visit | Attending: Orthopedic Surgery | Admitting: Orthopedic Surgery

## 2020-05-15 ENCOUNTER — Ambulatory Visit: Admit: 2020-05-15 | Payer: Medicaid Other | Admitting: Orthopedic Surgery

## 2020-05-15 SURGERY — ARTHROPLASTY, HIP, TOTAL, ANTERIOR APPROACH
Anesthesia: Spinal | Site: Hip | Laterality: Right

## 2020-06-14 ENCOUNTER — Other Ambulatory Visit: Payer: Self-pay | Admitting: Cardiology

## 2020-06-16 ENCOUNTER — Telehealth: Payer: Self-pay

## 2020-06-16 NOTE — Telephone Encounter (Signed)
**Note De-Identified Kaylee Trivett Obfuscation** I started a Entresto PA through covermymeds. Key: [id number]

## 2020-06-17 NOTE — Telephone Encounter (Signed)
Received a fax from [employer] and the PA for entresto was approved.

## 2020-06-25 ENCOUNTER — Ambulatory Visit
Admission: RE | Admit: 2020-06-25 | Discharge: 2020-06-25 | Disposition: A | Discharge status: Home / Self Care | Payer: Medicaid Other | Source: Ambulatory Visit | Attending: Family Medicine | Admitting: Family Medicine

## 2020-06-25 ENCOUNTER — Other Ambulatory Visit: Payer: Self-pay | Admitting: Family Medicine

## 2020-06-25 ENCOUNTER — Other Ambulatory Visit: Payer: Self-pay

## 2020-06-25 DIAGNOSIS — I509 Heart failure, unspecified: Secondary | ICD-10-CM

## 2020-06-25 DIAGNOSIS — R0600 Dyspnea, unspecified: Secondary | ICD-10-CM

## 2020-06-25 DIAGNOSIS — R042 Hemoptysis: Secondary | ICD-10-CM

## 2020-06-26 DIAGNOSIS — K625 Hemorrhage of anus and rectum: Secondary | ICD-10-CM | POA: Insufficient documentation

## 2020-06-30 ENCOUNTER — Encounter: Payer: Self-pay | Admitting: Gastroenterology

## 2020-07-29 ENCOUNTER — Ambulatory Visit: Payer: Medicaid Other | Admitting: Gastroenterology

## 2020-08-13 ENCOUNTER — Telehealth: Payer: Self-pay | Admitting: *Deleted

## 2020-08-13 NOTE — Telephone Encounter (Signed)
   [facility] Pre-operative Risk Assessment    Patient Name: Frank Flowers  DOB: 10-30-1960 MRN: [id number]  HEARTCARE STAFF:  - IMPORTANT!!!!!! Under Visit Info/Reason for Call, type in Other and utilize the format Clearance MM/DD/YY or Clearance TBD. Do not use dashes or single digits. - Please review there is not already an duplicate clearance open for this procedure. - If request is for dental extraction, please clarify the # of teeth to be extracted. - If the patient is currently at the dentist's office, call Pre-Op Callback Staff (MA/nurse) to input urgent request.  - If the patient is not currently in the dentist office, please route to the Pre-Op pool.  Request for surgical clearance:  What type of surgery is being performed? RIGHT TOTAL KNEE ARTHROPLASTY  When is this surgery scheduled? TBD  What type of clearance is required (medical clearance vs. Pharmacy clearance to hold med vs. Both)? MEDICAL  Are there any medications that need to be held prior to surgery and how long?  ASA   Practice name and name of physician performing surgery? [facility]; DR. Rod Can  What is the office phone number? [phone]   7.   What is the office fax number? [phone] ATTN: KERRI MAZE  8.   Anesthesia type (None, local, MAC, general) ? SPINAL   Julaine Hua 08/13/2020, 5:23 PM  _________________________________________________________________   (provider comments below)

## 2020-08-14 NOTE — Telephone Encounter (Signed)
    Patient Name: Frank Flowers  DOB: 07/08/1960 MRN: [id number]  Primary Cardiologist: Olga Millers, MD  Chart reviewed as part of pre-operative protocol coverage. Given past medical history and time since last visit, based on ACC/AHA guidelines, [name] would be at acceptable risk for the planned procedure without further cardiovascular testing.   The patient was seen for preoperative clearance with Dr. Jens Som at which time he was cleared for surgery. 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 He has not had any subsequent chest pain and has great functional capacity. He may hold ASA 3-5 days prior to surgery then resume once stable per surgery team.   The patient was advised that if he develops new symptoms prior to surgery to contact our office to arrange for a follow-up visit, and he verbalized understanding.  I will route this recommendation to the requesting party via Epic fax function and remove from pre-op pool.  Please call with questions.  Georgie Chard, NP 08/14/2020, 8:50 AM

## 2020-09-04 ENCOUNTER — Ambulatory Visit: Payer: Self-pay | Admitting: Student

## 2020-09-04 NOTE — Progress Notes (Signed)
Sent message, via epic in basket, requesting orders in epic from surgeon.  

## 2020-09-05 NOTE — Progress Notes (Signed)
DUE TO COVID-19 ONLY ONE VISITOR IS ALLOWED TO COME WITH YOU AND STAY IN THE WAITING ROOM ONLY DURING PRE OP AND PROCEDURE DAY OF SURGERY.  2 VISITOR  MAY VISIT WITH YOU AFTER SURGERY IN YOUR PRIVATE ROOM DURING VISITING HOURS ONLY!  YOU NEED TO HAVE A COVID 19 TEST ON___9/06/2020 ___@_  @_from  8am-3pm _____, THIS TEST MUST BE DONE BEFORE SURGERY,  Covid test is done at [address] [place], [place] [address].  This is a drive thru.  No appt required. Please see map.                 Your procedure is scheduled on:  09/17/2020   Report to [facility] Main  Entrance   Report to admitting at      [phone]     Call this number if you have problems the morning of surgery [phone]    REMEMBER: NO  SOLID FOOD CANDY OR GUM AFTER MIDNIGHT. CLEAR LIQUIDS UNTIL      0530am      . NOTHING BY MOUTH EXCEPT CLEAR LIQUIDS UNTIL       0530am . PLEASE FINISH ENSURE DRINK PER SURGEON ORDER  WHICH NEEDS TO BE COMPLETED AT   0530am    .      CLEAR LIQUID DIET   Foods Allowed                                                                    Coffee and tea, regular and decaf                            Fruit ices (not with fruit pulp)                                      Iced Popsicles                                    Carbonated beverages, regular and diet                                    Cranberry, grape and apple juices Sports drinks like Gatorade Lightly seasoned clear broth or consume(fat free) Sugar, honey syrup ___________________________________________________________________      BRUSH YOUR TEETH MORNING OF SURGERY AND RINSE YOUR MOUTH OUT, NO CHEWING GUM CANDY OR MINTS.     Take these medicines the morning of surgery with A SIP OF WATER:  coreg, gabapentin  DO NOT TAKE ANY DIABETIC MEDICATIONS DAY OF YOUR SURGERY                               You may not have any metal on your body including hair pins and              piercings  Do not wear jewelry, make-up, lotions,  powders or perfumes, deodorant  Do not wear nail polish on your fingernails.  Do not shave  48 hours prior to surgery.              Men may shave face and neck.   Do not bring valuables to the hospital. [place].  Contacts, dentures or bridgework may not be worn into surgery.  Leave suitcase in the car. After surgery it may be brought to your room.     Patients discharged the day of surgery will not be allowed to drive home. IF YOU ARE HAVING SURGERY AND GOING HOME THE SAME DAY, YOU MUST HAVE AN ADULT TO DRIVE YOU HOME AND BE WITH YOU FOR 24 HOURS. YOU MAY GO HOME BY TAXI OR UBER OR ORTHERWISE, BUT AN ADULT MUST ACCOMPANY YOU HOME AND STAY WITH YOU FOR 24 HOURS.  Name and phone number of your driver:  Special Instructions: N/A              Please read over the following fact sheets you were given: _____________________________________________________________________  [facility] - Preparing for Surgery Before surgery, you can play an important role.  Because skin is not sterile, your skin needs to be as free of germs as possible.  You can reduce the number of germs on your skin by washing with CHG (chlorahexidine gluconate) soap before surgery.  CHG is an antiseptic cleaner which kills germs and bonds with the skin to continue killing germs even after washing. Please DO NOT use if you have an allergy to CHG or antibacterial soaps.  If your skin becomes reddened/irritated stop using the CHG and inform your nurse when you arrive at Short Stay. Do not shave (including legs and underarms) for at least 48 hours prior to the first CHG shower.  You may shave your face/neck. Please follow these instructions carefully:  1.  Shower with CHG Soap the night before surgery and the  morning of Surgery.  2.  If you choose to wash your hair, wash your hair first as usual with your  normal  shampoo.  3.  After you shampoo, rinse your hair and body  thoroughly to remove the  shampoo.                           4.  Use CHG as you would any other liquid soap.  You can apply chg directly  to the skin and wash                       Gently with a scrungie or clean washcloth.  5.  Apply the CHG Soap to your body ONLY FROM THE NECK DOWN.   Do not use on face/ open                           Wound or open sores. Avoid contact with eyes, ears mouth and genitals (private parts).                       Wash face,  Genitals (private parts) with your normal soap.             6.  Wash thoroughly, paying special attention to the area where your surgery  will be performed.  7.  Thoroughly rinse your body with  warm water from the neck down.  8.  DO NOT shower/wash with your normal soap after using and rinsing off  the CHG Soap.                9.  Pat yourself dry with a clean towel.            10.  Wear clean pajamas.            11.  Place clean sheets on your bed the night of your first shower and do not  sleep with pets. Day of Surgery : Do not apply any lotions/deodorants the morning of surgery.  Please wear clean clothes to the hospital/surgery center.  FAILURE TO FOLLOW THESE INSTRUCTIONS MAY RESULT IN THE CANCELLATION OF YOUR SURGERY PATIENT SIGNATURE_________________________________  NURSE SIGNATURE__________________________________  ________________________________________________________________________

## 2020-09-09 ENCOUNTER — Encounter (HOSPITAL_COMMUNITY)
Admission: RE | Admit: 2020-09-09 | Discharge: 2020-09-09 | Disposition: A | Discharge status: Home / Self Care | Payer: Medicaid Other | Source: Ambulatory Visit | Attending: Orthopedic Surgery | Admitting: Orthopedic Surgery

## 2020-09-09 ENCOUNTER — Encounter (HOSPITAL_COMMUNITY): Payer: Self-pay

## 2020-09-09 ENCOUNTER — Other Ambulatory Visit: Payer: Self-pay

## 2020-09-09 DIAGNOSIS — Z7982 Long term (current) use of aspirin: Secondary | ICD-10-CM | POA: Diagnosis not present

## 2020-09-09 DIAGNOSIS — I509 Heart failure, unspecified: Secondary | ICD-10-CM | POA: Insufficient documentation

## 2020-09-09 DIAGNOSIS — Z791 Long term (current) use of non-steroidal anti-inflammatories (NSAID): Secondary | ICD-10-CM | POA: Diagnosis not present

## 2020-09-09 DIAGNOSIS — Z79899 Other long term (current) drug therapy: Secondary | ICD-10-CM | POA: Insufficient documentation

## 2020-09-09 DIAGNOSIS — I110 Hypertensive heart disease with heart failure: Secondary | ICD-10-CM | POA: Diagnosis not present

## 2020-09-09 DIAGNOSIS — Z01812 Encounter for preprocedural laboratory examination: Secondary | ICD-10-CM | POA: Insufficient documentation

## 2020-09-09 DIAGNOSIS — Z955 Presence of coronary angioplasty implant and graft: Secondary | ICD-10-CM | POA: Diagnosis not present

## 2020-09-09 DIAGNOSIS — I2510 Atherosclerotic heart disease of native coronary artery without angina pectoris: Secondary | ICD-10-CM | POA: Diagnosis not present

## 2020-09-09 DIAGNOSIS — Z87891 Personal history of nicotine dependence: Secondary | ICD-10-CM | POA: Diagnosis not present

## 2020-09-09 LAB — CBC
HCT: 56.7 % — ABNORMAL HIGH (ref 39.0–52.0)
Hemoglobin: 18.5 g/dL — ABNORMAL HIGH (ref 13.0–17.0)
MCH: 29.9 pg (ref 26.0–34.0)
MCHC: 32.6 g/dL (ref 30.0–36.0)
MCV: 91.6 fL (ref 80.0–100.0)
Platelets: 205 10*3/uL (ref 150–400)
RBC: 6.19 MIL/uL — ABNORMAL HIGH (ref 4.22–5.81)
RDW: 16.3 % — ABNORMAL HIGH (ref 11.5–15.5)
WBC: 6.8 10*3/uL (ref 4.0–10.5)
nRBC: 0 % (ref 0.0–0.2)

## 2020-09-09 LAB — PROTIME-INR
INR: 1 (ref 0.8–1.2)
Prothrombin Time: 12.8 seconds (ref 11.4–15.2)

## 2020-09-09 LAB — COMPREHENSIVE METABOLIC PANEL
ALT: 14 U/L (ref 0–44)
AST: 20 U/L (ref 15–41)
Albumin: 4 g/dL (ref 3.5–5.0)
Alkaline Phosphatase: 57 U/L (ref 38–126)
Anion gap: 11 (ref 5–15)
BUN: 16 mg/dL (ref 6–20)
CO2: 22 mmol/L (ref 22–32)
Calcium: 9.4 mg/dL (ref 8.9–10.3)
Chloride: 104 mmol/L (ref 98–111)
Creatinine, Ser: 1.08 mg/dL (ref 0.61–1.24)
GFR, Estimated: >60 mL/min (ref >60)
Glucose, Bld: 99 mg/dL (ref 70–99)
Potassium: 3.9 mmol/L (ref 3.5–5.1)
Sodium: 137 mmol/L (ref 135–145)
Total Bilirubin: 0.6 mg/dL (ref 0.3–1.2)
Total Protein: 8 g/dL (ref 6.5–8.1)

## 2020-09-09 LAB — SURGICAL PCR SCREEN
MRSA, PCR: NEGATIVE
Staphylococcus aureus: NEGATIVE

## 2020-09-09 NOTE — Progress Notes (Addendum)
Anesthesia Review:  PCP: [facility] on [address]  Cardiologist : clearance- telephone encounter- Georgie Chard , NP 08/13/20  DR Jens Som  05/01/20- LOV Dr Jens Som  Chest x-ray : 06/26/20  EKG :04/14/2020  Echo : 01/22/20  Stress test: 2021  Cardiac Cath :  2021  Activity level:  Sleep Study/ CPAP : Fasting Blood Sugar :      / Checks Blood Sugar -- times a day:   Blood Thinner/ Instructions /Last Dose: ASA / Instructions/ Last Dose :   81 mg Aspirin  Covid test on 09/16/20 before 1100am  CBC done 09/09/20 routed to Dr Linna Caprice.

## 2020-09-09 NOTE — Anesthesia Preprocedure Evaluation (Addendum)
Anesthesia Evaluation  Patient identified by MRN, date of birth, ID band Patient awake    Reviewed: Allergy & Precautions, NPO status , Patient's Chart, lab work & pertinent test results, reviewed documented beta blocker date and time   Airway Mallampati: I  TM Distance: >3 FB Neck ROM: Full    Dental  (+) Missing, Dental Advisory Given,    Pulmonary neg pulmonary ROS, Patient abstained from smoking., former smoker,    Pulmonary exam normal breath sounds clear to auscultation       Cardiovascular hypertension, Pt. on home beta blockers and Pt. on medications + CAD, + Past MI, + Cardiac Stents (2013) and +CHF  Normal cardiovascular exam Rhythm:Regular Rate:Normal  EKG: 04/14/2020 Rate 84 bpm  Sinus rhythm  Prolonged PR interval  Probable left atrial enlargement   CV: Echo 01/22/20 1. Left ventricular ejection fraction, by estimation, is 35 to 40%. The  left ventricle has moderately decreased function. The left ventricle  demonstrates global hypokinesis. The left ventricular internal cavity size  was mildly to moderately dilated.  Left ventricular diastolic parameters are consistent with Grade I  diastolic dysfunction (impaired relaxation).  2. Right ventricular systolic function is low normal. The right  ventricular size is mildly enlarged. Tricuspid regurgitation signal is  inadequate for assessing PA pressure.  3. The mitral valve is grossly normal. Trivial mitral valve  regurgitation. No evidence of mitral stenosis.  4. The aortic valve is tricuspid. Aortic valve regurgitation is not  visualized. No aortic stenosis is present.  5. The inferior vena cava is normal in size with greater than 50%  respiratory variability, suggesting right atrial pressure of 3 mmHg.  Comparison(s): Changes from prior study are noted. The left ventricular  function has improved. EF now 35-40% with global HK.   Cardiac Cath  08/23/2019 Dist RCA stent patent with minimal restenosis. Prox RCA lesion is 30% stenosed. Mid RCA lesion is 25% stenosed. The left ventricular ejection fraction is less than 25% by visual estimate. There is severe left ventricular systolic dysfunction. LV end diastolic pressure is mildly elevated. There is no aortic valve stenosis.   Neuro/Psych negative neurological ROS  negative psych ROS   GI/Hepatic negative GI ROS, Neg liver ROS,   Endo/Other  negative endocrine ROS  Renal/GU negative Renal ROS  negative genitourinary   Musculoskeletal  (+) Arthritis ,   Abdominal   Peds  Hematology negative hematology ROS (+)   Anesthesia Other Findings   Reproductive/Obstetrics                           Anesthesia Physical Anesthesia Plan  ASA: 3  Anesthesia Plan: Spinal   Post-op Pain Management:    Induction:   PONV Risk Score and Plan: Treatment may vary due to age or medical condition, Midazolam, Propofol infusion, Dexamethasone and Ondansetron  Airway Management Planned: Natural Airway  Additional Equipment:   Intra-op Plan:   Post-operative Plan:   Informed Consent: I have reviewed the patients History and Physical, chart, labs and discussed the procedure including the risks, benefits and alternatives for the proposed anesthesia with the patient or authorized representative who has indicated his/her understanding and acceptance.     Dental advisory given  Plan Discussed with: CRNA  Anesthesia Plan Comments:        Anesthesia Quick Evaluation
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
## 2020-09-12 ENCOUNTER — Ambulatory Visit: Payer: Self-pay | Admitting: Student
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
## 2020-09-16 ENCOUNTER — Encounter (HOSPITAL_COMMUNITY): Payer: Self-pay | Admitting: Orthopedic Surgery

## 2020-09-16 ENCOUNTER — Other Ambulatory Visit: Payer: Self-pay | Admitting: Orthopedic Surgery

## 2020-09-16 LAB — SARS CORONAVIRUS 2 (TAT 6-24 HRS): SARS Coronavirus 2: NEGATIVE

## 2020-09-17 ENCOUNTER — Ambulatory Visit (HOSPITAL_COMMUNITY): Payer: Medicaid Other | Admitting: Anesthesiology

## 2020-09-17 ENCOUNTER — Observation Stay (HOSPITAL_COMMUNITY)
Admission: RE | Admit: 2020-09-17 | Discharge: 2020-09-19 | Disposition: A | Discharge status: Home / Self Care | Payer: Medicaid Other | Attending: Orthopedic Surgery | Admitting: Orthopedic Surgery

## 2020-09-17 ENCOUNTER — Ambulatory Visit (HOSPITAL_COMMUNITY): Payer: Medicaid Other

## 2020-09-17 ENCOUNTER — Other Ambulatory Visit: Payer: Self-pay

## 2020-09-17 ENCOUNTER — Encounter (HOSPITAL_COMMUNITY)
Admission: RE | Disposition: A | Discharge status: Home / Self Care | Payer: Self-pay | Source: Home / Self Care | Attending: Orthopedic Surgery

## 2020-09-17 ENCOUNTER — Ambulatory Visit (HOSPITAL_COMMUNITY): Payer: Medicaid Other | Admitting: Physician Assistant

## 2020-09-17 ENCOUNTER — Encounter (HOSPITAL_COMMUNITY): Payer: Self-pay | Admitting: Orthopedic Surgery

## 2020-09-17 DIAGNOSIS — I2510 Atherosclerotic heart disease of native coronary artery without angina pectoris: Secondary | ICD-10-CM | POA: Insufficient documentation

## 2020-09-17 DIAGNOSIS — Z955 Presence of coronary angioplasty implant and graft: Secondary | ICD-10-CM | POA: Diagnosis not present

## 2020-09-17 DIAGNOSIS — I110 Hypertensive heart disease with heart failure: Secondary | ICD-10-CM | POA: Insufficient documentation

## 2020-09-17 DIAGNOSIS — Z7982 Long term (current) use of aspirin: Secondary | ICD-10-CM | POA: Insufficient documentation

## 2020-09-17 DIAGNOSIS — Z79899 Other long term (current) drug therapy: Secondary | ICD-10-CM | POA: Insufficient documentation

## 2020-09-17 DIAGNOSIS — I509 Heart failure, unspecified: Secondary | ICD-10-CM | POA: Diagnosis not present

## 2020-09-17 DIAGNOSIS — Z87891 Personal history of nicotine dependence: Secondary | ICD-10-CM | POA: Diagnosis not present

## 2020-09-17 DIAGNOSIS — Z419 Encounter for procedure for purposes other than remedying health state, unspecified: Secondary | ICD-10-CM

## 2020-09-17 DIAGNOSIS — Z09 Encounter for follow-up examination after completed treatment for conditions other than malignant neoplasm: Secondary | ICD-10-CM

## 2020-09-17 DIAGNOSIS — M1611 Unilateral primary osteoarthritis, right hip: Principal | ICD-10-CM | POA: Diagnosis present

## 2020-09-17 HISTORY — PX: TOTAL HIP ARTHROPLASTY: SHX124

## 2020-09-17 LAB — TYPE AND SCREEN
ABO/RH(D): A POS
Antibody Screen: NEGATIVE

## 2020-09-17 SURGERY — ARTHROPLASTY, HIP, TOTAL, ANTERIOR APPROACH
Anesthesia: Spinal | Site: Hip | Laterality: Right

## 2020-09-17 MED ORDER — POVIDONE-IODINE 10 % EX SWAB
2.0000 "application " | Freq: Once | CUTANEOUS | Status: AC
  Administered 2020-09-17: 2 via TOPICAL

## 2020-09-17 MED ORDER — CEFAZOLIN SODIUM-DEXTROSE 2-4 GM/100ML-% IV SOLN
2.0000 g | Freq: Four times a day (QID) | INTRAVENOUS | Status: AC
  Administered 2020-09-17 (×2): 2 g via INTRAVENOUS
  Filled 2020-09-17 (×2): qty 100

## 2020-09-17 MED ORDER — METHOCARBAMOL 500 MG PO TABS
500.0000 mg | ORAL_TABLET | Freq: Four times a day (QID) | ORAL | Status: DC | PRN
  Administered 2020-09-17 – 2020-09-19 (×3): 500 mg via ORAL
  Filled 2020-09-17 (×3): qty 1

## 2020-09-17 MED ORDER — FUROSEMIDE 40 MG PO TABS
40.0000 mg | ORAL_TABLET | Freq: Every day | ORAL | Status: DC
  Administered 2020-09-17 – 2020-09-19 (×3): 40 mg via ORAL
  Filled 2020-09-17 (×3): qty 1

## 2020-09-17 MED ORDER — ISOPROPYL ALCOHOL 70 % SOLN
Status: AC
  Filled 2020-09-17: qty 480

## 2020-09-17 MED ORDER — HYDROCODONE-ACETAMINOPHEN 5-325 MG PO TABS
1.0000 | ORAL_TABLET | ORAL | Status: DC | PRN
  Administered 2020-09-18: 1 via ORAL
  Filled 2020-09-17: qty 2

## 2020-09-17 MED ORDER — FENTANYL CITRATE (PF) 100 MCG/2ML IJ SOLN
INTRAMUSCULAR | Status: DC | PRN
  Administered 2020-09-17: 50 ug via INTRAVENOUS
  Administered 2020-09-17: 25 ug via INTRAVENOUS

## 2020-09-17 MED ORDER — PROPOFOL 500 MG/50ML IV EMUL
INTRAVENOUS | Status: DC | PRN
  Administered 2020-09-17: 75 ug/kg/min via INTRAVENOUS

## 2020-09-17 MED ORDER — FENTANYL CITRATE PF 50 MCG/ML IJ SOSY: 25.0000 ug | PREFILLED_SYRINGE | INTRAMUSCULAR | Status: DC | PRN

## 2020-09-17 MED ORDER — ONDANSETRON HCL 4 MG/2ML IJ SOLN
INTRAMUSCULAR | Status: AC
  Filled 2020-09-17: qty 6

## 2020-09-17 MED ORDER — SENNA 8.6 MG PO TABS
1.0000 | ORAL_TABLET | Freq: Two times a day (BID) | ORAL | Status: DC
  Administered 2020-09-18 – 2020-09-19 (×3): 8.6 mg via ORAL
  Filled 2020-09-17 (×4): qty 1

## 2020-09-17 MED ORDER — KETOROLAC TROMETHAMINE 30 MG/ML IJ SOLN
INTRAMUSCULAR | Status: AC
  Filled 2020-09-17: qty 1

## 2020-09-17 MED ORDER — GABAPENTIN 300 MG PO CAPS
300.0000 mg | ORAL_CAPSULE | Freq: Three times a day (TID) | ORAL | Status: DC
  Administered 2020-09-17 – 2020-09-19 (×5): 300 mg via ORAL
  Filled 2020-09-17 (×6): qty 1

## 2020-09-17 MED ORDER — DOCUSATE SODIUM 100 MG PO CAPS
100.0000 mg | ORAL_CAPSULE | Freq: Two times a day (BID) | ORAL | Status: DC
  Administered 2020-09-17 – 2020-09-19 (×4): 100 mg via ORAL
  Filled 2020-09-17 (×4): qty 1

## 2020-09-17 MED ORDER — SODIUM CHLORIDE 0.9 % IV SOLN: INTRAVENOUS | Status: DC

## 2020-09-17 MED ORDER — WATER FOR IRRIGATION, STERILE IR SOLN
Status: DC | PRN
  Administered 2020-09-17: 2000 mL

## 2020-09-17 MED ORDER — MENTHOL 3 MG MT LOZG: 1.0000 | LOZENGE | OROMUCOSAL | Status: DC | PRN

## 2020-09-17 MED ORDER — CHLORHEXIDINE GLUCONATE CLOTH 2 % EX PADS: 6.0000 | MEDICATED_PAD | Freq: Every day | CUTANEOUS | Status: DC

## 2020-09-17 MED ORDER — PHENYLEPHRINE HCL-NACL 20-0.9 MG/250ML-% IV SOLN
INTRAVENOUS | Status: AC
  Filled 2020-09-17: qty 750

## 2020-09-17 MED ORDER — POLYETHYLENE GLYCOL 3350 17 G PO PACK: 17.0000 g | PACK | Freq: Every day | ORAL | Status: DC | PRN

## 2020-09-17 MED ORDER — METOCLOPRAMIDE HCL 5 MG PO TABS: 5.0000 mg | ORAL_TABLET | Freq: Three times a day (TID) | ORAL | Status: DC | PRN

## 2020-09-17 MED ORDER — PROPOFOL 10 MG/ML IV BOLUS
INTRAVENOUS | Status: AC
  Filled 2020-09-17: qty 40

## 2020-09-17 MED ORDER — CELECOXIB 200 MG PO CAPS
200.0000 mg | ORAL_CAPSULE | Freq: Two times a day (BID) | ORAL | Status: DC
  Administered 2020-09-17 – 2020-09-19 (×4): 200 mg via ORAL
  Filled 2020-09-17 (×4): qty 1

## 2020-09-17 MED ORDER — BUPIVACAINE IN DEXTROSE 0.75-8.25 % IT SOLN
INTRATHECAL | Status: DC | PRN
Start: 2020-09-17 — End: 2020-09-17
  Administered 2020-09-17: 1.8 mL via INTRATHECAL

## 2020-09-17 MED ORDER — 0.9 % SODIUM CHLORIDE (POUR BTL) OPTIME
TOPICAL | Status: DC | PRN
  Administered 2020-09-17: 1000 mL

## 2020-09-17 MED ORDER — PHENOL 1.4 % MT LIQD: 1.0000 | OROMUCOSAL | Status: DC | PRN

## 2020-09-17 MED ORDER — TRANEXAMIC ACID-NACL 1000-0.7 MG/100ML-% IV SOLN
1000.0000 mg | INTRAVENOUS | Status: AC
  Administered 2020-09-17: 1000 mg via INTRAVENOUS
  Filled 2020-09-17: qty 100

## 2020-09-17 MED ORDER — CEFAZOLIN IN SODIUM CHLORIDE 3-0.9 GM/100ML-% IV SOLN
3.0000 g | INTRAVENOUS | Status: AC
  Administered 2020-09-17: 3 g via INTRAVENOUS
  Filled 2020-09-17: qty 100

## 2020-09-17 MED ORDER — ACETAMINOPHEN 325 MG PO TABS
325.0000 mg | ORAL_TABLET | Freq: Four times a day (QID) | ORAL | Status: DC | PRN
  Administered 2020-09-18 – 2020-09-19 (×2): 650 mg via ORAL
  Filled 2020-09-17 (×2): qty 2

## 2020-09-17 MED ORDER — SPIRONOLACTONE 12.5 MG HALF TABLET
12.5000 mg | ORAL_TABLET | Freq: Every day | ORAL | Status: DC
  Administered 2020-09-17 – 2020-09-19 (×3): 12.5 mg via ORAL
  Filled 2020-09-17 (×3): qty 1

## 2020-09-17 MED ORDER — ALUM & MAG HYDROXIDE-SIMETH 200-200-20 MG/5ML PO SUSP: 30.0000 mL | ORAL | Status: DC | PRN

## 2020-09-17 MED ORDER — DEXAMETHASONE SODIUM PHOSPHATE 10 MG/ML IJ SOLN
10.0000 mg | Freq: Once | INTRAMUSCULAR | Status: AC
  Administered 2020-09-18: 10 mg via INTRAVENOUS
  Filled 2020-09-17: qty 1

## 2020-09-17 MED ORDER — HYDROCODONE-ACETAMINOPHEN 7.5-325 MG PO TABS
1.0000 | ORAL_TABLET | ORAL | Status: DC | PRN
  Administered 2020-09-17: 2 via ORAL
  Filled 2020-09-17: qty 2

## 2020-09-17 MED ORDER — DIPHENHYDRAMINE HCL 12.5 MG/5ML PO ELIX: 12.5000 mg | ORAL_SOLUTION | ORAL | Status: DC | PRN

## 2020-09-17 MED ORDER — ALBUTEROL SULFATE (2.5 MG/3ML) 0.083% IN NEBU
INHALATION_SOLUTION | RESPIRATORY_TRACT | Status: AC
  Filled 2020-09-17: qty 3

## 2020-09-17 MED ORDER — EPHEDRINE SULFATE-NACL 50-0.9 MG/10ML-% IV SOSY
PREFILLED_SYRINGE | INTRAVENOUS | Status: DC | PRN
  Administered 2020-09-17: 10 mg via INTRAVENOUS

## 2020-09-17 MED ORDER — ROSUVASTATIN CALCIUM 20 MG PO TABS
40.0000 mg | ORAL_TABLET | Freq: Every day | ORAL | Status: DC
  Administered 2020-09-17 – 2020-09-18 (×2): 40 mg via ORAL
  Filled 2020-09-17: qty 2
  Filled 2020-09-17: qty 4

## 2020-09-17 MED ORDER — ONDANSETRON HCL 4 MG PO TABS
4.0000 mg | ORAL_TABLET | Freq: Four times a day (QID) | ORAL | Status: DC | PRN
  Administered 2020-09-18 – 2020-09-19 (×2): 4 mg via ORAL
  Filled 2020-09-17 (×3): qty 1

## 2020-09-17 MED ORDER — MIDAZOLAM HCL 5 MG/5ML IJ SOLN
INTRAMUSCULAR | Status: DC | PRN
  Administered 2020-09-17: 2 mg via INTRAVENOUS

## 2020-09-17 MED ORDER — METOCLOPRAMIDE HCL 5 MG/ML IJ SOLN: 5.0000 mg | Freq: Three times a day (TID) | INTRAMUSCULAR | Status: DC | PRN

## 2020-09-17 MED ORDER — SODIUM CHLORIDE 0.9 % IR SOLN
Status: DC | PRN
  Administered 2020-09-17: 1000 mL

## 2020-09-17 MED ORDER — SERTRALINE HCL 100 MG PO TABS
100.0000 mg | ORAL_TABLET | Freq: Every day | ORAL | Status: DC
  Administered 2020-09-17 – 2020-09-18 (×2): 100 mg via ORAL
  Filled 2020-09-17 (×2): qty 1

## 2020-09-17 MED ORDER — SACUBITRIL-VALSARTAN 49-51 MG PO TABS
1.0000 | ORAL_TABLET | Freq: Two times a day (BID) | ORAL | Status: DC
  Administered 2020-09-17 – 2020-09-18 (×4): 1 via ORAL
  Filled 2020-09-17 (×5): qty 1

## 2020-09-17 MED ORDER — ALBUTEROL SULFATE (2.5 MG/3ML) 0.083% IN NEBU
2.5000 mg | INHALATION_SOLUTION | Freq: Once | RESPIRATORY_TRACT | Status: AC
  Administered 2020-09-17: 2.5 mg via RESPIRATORY_TRACT

## 2020-09-17 MED ORDER — PHENYLEPHRINE HCL-NACL 20-0.9 MG/250ML-% IV SOLN
INTRAVENOUS | Status: DC | PRN
  Administered 2020-09-17: 40 ug/min via INTRAVENOUS

## 2020-09-17 MED ORDER — DEXAMETHASONE SODIUM PHOSPHATE 10 MG/ML IJ SOLN
INTRAMUSCULAR | Status: DC | PRN
  Administered 2020-09-17: 4 mg via INTRAVENOUS

## 2020-09-17 MED ORDER — DEXAMETHASONE SODIUM PHOSPHATE 10 MG/ML IJ SOLN
INTRAMUSCULAR | Status: AC
  Filled 2020-09-17: qty 3

## 2020-09-17 MED ORDER — POVIDONE-IODINE 10 % EX SWAB: 2.0000 "application " | Freq: Once | CUTANEOUS | Status: DC

## 2020-09-17 MED ORDER — LACTATED RINGERS IV SOLN: INTRAVENOUS | Status: DC | PRN

## 2020-09-17 MED ORDER — CARVEDILOL 6.25 MG PO TABS
6.2500 mg | ORAL_TABLET | Freq: Two times a day (BID) | ORAL | Status: DC
  Administered 2020-09-17 – 2020-09-18 (×4): 6.25 mg via ORAL
  Filled 2020-09-17 (×5): qty 1

## 2020-09-17 MED ORDER — ACETAMINOPHEN 500 MG PO TABS: 1000.0000 mg | ORAL_TABLET | Freq: Once | ORAL | Status: DC

## 2020-09-17 MED ORDER — MORPHINE SULFATE (PF) 2 MG/ML IV SOLN: 0.5000 mg | INTRAVENOUS | Status: DC | PRN

## 2020-09-17 MED ORDER — KETOROLAC TROMETHAMINE 30 MG/ML IJ SOLN
INTRAMUSCULAR | Status: DC | PRN
  Administered 2020-09-17: 30 mg via INTRA_ARTICULAR

## 2020-09-17 MED ORDER — GLYCOPYRROLATE 0.2 MG/ML IJ SOLN
INTRAMUSCULAR | Status: AC
  Filled 2020-09-17: qty 1

## 2020-09-17 MED ORDER — FENTANYL CITRATE (PF) 100 MCG/2ML IJ SOLN
INTRAMUSCULAR | Status: AC
  Filled 2020-09-17: qty 2

## 2020-09-17 MED ORDER — ONDANSETRON HCL 4 MG/2ML IJ SOLN
INTRAMUSCULAR | Status: DC | PRN
  Administered 2020-09-17: 4 mg via INTRAVENOUS

## 2020-09-17 MED ORDER — ONDANSETRON HCL 4 MG/2ML IJ SOLN
4.0000 mg | Freq: Four times a day (QID) | INTRAMUSCULAR | Status: DC | PRN
  Administered 2020-09-17 – 2020-09-18 (×2): 4 mg via INTRAVENOUS
  Filled 2020-09-17 (×3): qty 2

## 2020-09-17 MED ORDER — BUPIVACAINE-EPINEPHRINE (PF) 0.25% -1:200000 IJ SOLN
INTRAMUSCULAR | Status: AC
  Filled 2020-09-17: qty 30

## 2020-09-17 MED ORDER — ISOPROPYL ALCOHOL 70 % SOLN
Status: DC | PRN
  Administered 2020-09-17: 1 via TOPICAL

## 2020-09-17 MED ORDER — METHOCARBAMOL 500 MG IVPB - SIMPLE MED
500.0000 mg | Freq: Four times a day (QID) | INTRAVENOUS | Status: DC | PRN
  Filled 2020-09-17: qty 50

## 2020-09-17 MED ORDER — ASPIRIN 81 MG PO CHEW
81.0000 mg | CHEWABLE_TABLET | Freq: Two times a day (BID) | ORAL | Status: DC
  Administered 2020-09-17 – 2020-09-19 (×4): 81 mg via ORAL
  Filled 2020-09-17 (×4): qty 1

## 2020-09-17 MED ORDER — BUPIVACAINE-EPINEPHRINE 0.25% -1:200000 IJ SOLN
INTRAMUSCULAR | Status: DC | PRN
  Administered 2020-09-17: 30 mL

## 2020-09-17 MED ORDER — MIDAZOLAM HCL 2 MG/2ML IJ SOLN
INTRAMUSCULAR | Status: AC
  Filled 2020-09-17: qty 2

## 2020-09-17 MED ORDER — ALBUTEROL SULFATE HFA 108 (90 BASE) MCG/ACT IN AERS: 2.0000 | INHALATION_SPRAY | Freq: Four times a day (QID) | RESPIRATORY_TRACT | Status: DC | PRN

## 2020-09-17 MED ORDER — ACETAMINOPHEN 10 MG/ML IV SOLN
1000.0000 mg | Freq: Once | INTRAVENOUS | Status: AC
Start: 2020-09-17 — End: 2020-09-17
  Administered 2020-09-17: 1000 mg via INTRAVENOUS
  Filled 2020-09-17: qty 100

## 2020-09-17 MED ORDER — LORATADINE 10 MG PO TABS: 10.0000 mg | ORAL_TABLET | Freq: Every day | ORAL | Status: DC | PRN

## 2020-09-17 MED ORDER — SODIUM CHLORIDE (PF) 0.9 % IJ SOLN
INTRAMUSCULAR | Status: DC | PRN
  Administered 2020-09-17: 30 mL

## 2020-09-17 SURGICAL SUPPLY — 64 items
ADH SKN CLS APL DERMABOND .7 (GAUZE/BANDAGES/DRESSINGS) ×1
APL PRP STRL LF DISP 70% ISPRP (MISCELLANEOUS) ×1
BAG COUNTER SPONGE SURGICOUNT (BAG) IMPLANT
BAG DECANTER FOR FLEXI CONT (MISCELLANEOUS) IMPLANT
BAG SPEC THK2 15X12 ZIP CLS (MISCELLANEOUS)
BAG SPNG CNTER NS LX DISP (BAG)
BAG ZIPLOCK 12X15 (MISCELLANEOUS) IMPLANT
BLADE SURG SZ10 CARB STEEL (BLADE) IMPLANT
CHLORAPREP W/TINT 26 (MISCELLANEOUS) ×2 IMPLANT
COVER PERINEAL POST (MISCELLANEOUS) ×2 IMPLANT
COVER SURGICAL LIGHT HANDLE (MISCELLANEOUS) ×2 IMPLANT
CUP ACET PINNACLE SECTR 60MM (Hips) ×1 IMPLANT
DECANTER SPIKE VIAL GLASS SM (MISCELLANEOUS) ×2 IMPLANT
DERMABOND ADVANCED (GAUZE/BANDAGES/DRESSINGS) ×1
DERMABOND ADVANCED .7 DNX12 (GAUZE/BANDAGES/DRESSINGS) ×1 IMPLANT
DRAPE IMP U-DRAPE 54X76 (DRAPES) ×2 IMPLANT
DRAPE SHEET LG 3/4 BI-LAMINATE (DRAPES) ×6 IMPLANT
DRAPE STERI IOBAN 125X83 (DRAPES) IMPLANT
DRAPE U-SHAPE 47X51 STRL (DRAPES) ×4 IMPLANT
DRSG AQUACEL AG ADV 3.5X10 (GAUZE/BANDAGES/DRESSINGS) ×2 IMPLANT
ELECT REM PT RETURN 15FT ADLT (MISCELLANEOUS) ×2 IMPLANT
GAUZE SPONGE 4X4 12PLY STRL (GAUZE/BANDAGES/DRESSINGS) ×2 IMPLANT
GLOVE SRG 8 PF TXTR STRL LF DI (GLOVE) ×1 IMPLANT
GLOVE SURG ENC MOIS LTX SZ8.5 (GLOVE) ×4 IMPLANT
GLOVE SURG ENC TEXT LTX SZ7.5 (GLOVE) ×4 IMPLANT
GLOVE SURG UNDER POLY LF SZ8 (GLOVE) ×2
GLOVE SURG UNDER POLY LF SZ8.5 (GLOVE) ×2 IMPLANT
GOWN SPEC L3 XXLG W/TWL (GOWN DISPOSABLE) ×2 IMPLANT
GOWN STRL REUS W/TWL XL LVL3 (GOWN DISPOSABLE) ×2 IMPLANT
HANDPIECE INTERPULSE COAX TIP (DISPOSABLE) ×2
HEAD CERAMIC 36 PLUS5 (Hips) ×2 IMPLANT
HOLDER FOLEY CATH W/STRAP (MISCELLANEOUS) ×2 IMPLANT
HOOD PEEL AWAY FLYTE STAYCOOL (MISCELLANEOUS) ×8 IMPLANT
JET LAVAGE IRRISEPT WOUND (IRRIGATION / IRRIGATOR)
KIT TURNOVER KIT A (KITS) ×2 IMPLANT
LAVAGE JET IRRISEPT WOUND (IRRIGATION / IRRIGATOR) IMPLANT
LINER PINN ALTRX ACTABR 36X60 (Liner) ×1 IMPLANT
LINER PINNACLE ALTRAX ACTABULR (Liner) ×2 IMPLANT
MANIFOLD NEPTUNE II (INSTRUMENTS) ×2 IMPLANT
MARKER SKIN DUAL TIP RULER LAB (MISCELLANEOUS) ×2 IMPLANT
NDL SAFETY ECLIPSE 18X1.5 (NEEDLE) ×1 IMPLANT
NEEDLE HYPO 18GX1.5 SHARP (NEEDLE) ×2
NEEDLE SPNL 18GX3.5 QUINCKE PK (NEEDLE) ×2 IMPLANT
PACK ANTERIOR HIP CUSTOM (KITS) ×2 IMPLANT
PENCIL SMOKE EVACUATOR (MISCELLANEOUS) IMPLANT
PINNSECTOR W/GRIP ACE CUP 60MM (Hips) ×2 IMPLANT
SAW OSC TIP CART 19.5X105X1.3 (SAW) ×2 IMPLANT
SEALER BIPOLAR AQUA 6.0 (INSTRUMENTS) ×2 IMPLANT
SET HNDPC FAN SPRY TIP SCT (DISPOSABLE) ×1 IMPLANT
STAPLER INSORB 30 2030 C-SECTI (MISCELLANEOUS) ×2 IMPLANT
STEM TRI LOC BPS GRIP SZ9 OFFS ×1 IMPLANT
SUT MNCRL AB 3-0 PS2 18 (SUTURE) ×2 IMPLANT
SUT MNCRL AB 4-0 PS2 18 (SUTURE) ×2 IMPLANT
SUT MON AB 2-0 CT1 36 (SUTURE) ×4 IMPLANT
SUT STRATAFIX PDO 1 14 VIOLET (SUTURE) ×2
SUT STRATFX PDO 1 14 VIOLET (SUTURE) ×1
SUT VIC AB 2-0 CT1 27 (SUTURE) ×2
SUT VIC AB 2-0 CT1 TAPERPNT 27 (SUTURE) ×1 IMPLANT
SUTURE STRATFX PDO 1 14 VIOLET (SUTURE) ×1 IMPLANT
SYR 3ML LL SCALE MARK (SYRINGE) ×2 IMPLANT
TRAY FOLEY MTR SLVR 16FR STAT (SET/KITS/TRAYS/PACK) IMPLANT
TRI LOC BPS W/GRIP SZ 9 OFFS ×2 IMPLANT
TUBE SUCTION HIGH CAP CLEAR NV (SUCTIONS) ×2 IMPLANT
WATER STERILE IRR 1000ML POUR (IV SOLUTION) ×2 IMPLANT

## 2020-09-17 NOTE — Op Note (Signed)
OPERATIVE REPORT  SURGEON: Rod Can, MD   ASSISTANT: Cherlynn June, PA-C.  PREOPERATIVE DIAGNOSIS: Right hip arthritis.   POSTOPERATIVE DIAGNOSIS: Right hip arthritis.   PROCEDURE: Right total hip arthroplasty, anterior approach.   IMPLANTS: DePuy Tri Lock stem, size 9, hi offset. DePuy Pinnacle Cup, size 60 mm. DePuy Altrx liner, size 36 by 60 mm, neutral. DePuy Biolox ceramic head ball, size 36 + 5 mm.  ANESTHESIA:  MAC and Spinal  ESTIMATED BLOOD LOSS:-100 mL    ANTIBIOTICS: 3g Ancef.  DRAINS: None.  COMPLICATIONS: None.   CONDITION: PACU - hemodynamically stable.   BRIEF CLINICAL NOTE: Frank Flowers is a 60 y.o. male with a long-standing history of Right hip arthritis. After failing conservative management, the patient was indicated for total hip arthroplasty. The risks, benefits, and alternatives to the procedure were explained, and the patient elected to proceed.  PROCEDURE IN DETAIL: Surgical site was marked by myself in the pre-op holding area. Once inside the operating room, spinal anesthesia was obtained, and a foley catheter was inserted. The patient was then positioned on the Hana table.  All bony prominences were well padded.  The hip was prepped and draped in the normal sterile surgical fashion.  A time-out was called verifying side and site of surgery. The patient received IV antibiotics within 60 minutes of beginning the procedure.   Bikini incision was created three finger breadths distal to the ASIS, taking care to stay lateral to the medial border of the ASIS. The direct anterior approach to the hip was performed through the Hueter interval.  Lateral femoral circumflex vessels were treated with the Auqumantys. The anterior capsule was exposed and an inverted T capsulotomy was made. The femoral neck cut was made to the level of the templated cut.  A corkscrew was placed into the head and the head was removed.  The femoral head was found to have eburnated  bone. The head was passed to the back table and was measured. Pubofemoral ligament was released off of the calcar, taking care to stay on bone. Superior capsule was released from the greater trochanter, taking care to stay lateral to the posterior border of the femoral neck in order to preserve the short external rotators.   Acetabular exposure was achieved, and the pulvinar and labrum were excised. Sequential reaming of the acetabulum was then performed up to a size 59 mm reamer under direct visulization. A 60 mm cup was then opened and impacted into place at approximately 40 degrees of abduction and 20 degrees of anteversion. The final polyethylene liner was impacted into place and acetabular osteophytes were removed.    I then gained femoral exposure taking care to protect the abductors and greater trochanter.  This was performed using standard external rotation, extension, and adduction.  A cookie cutter was used to enter the femoral canal, and then the femoral canal finder was placed.  Sequential broaching was performed up to a size 9.  Calcar planer was used on the femoral neck remnant.  I placed a hi offset neck and a trial head ball.  The hip was reduced.  Leg lengths and offset were checked fluoroscopically.  The hip was dislocated and trial components were removed.  The final implants were placed, and the hip was reduced.  Fluoroscopy was used to confirm component position and leg lengths.  At 90 degrees of external rotation and full extension, the hip was stable to an anterior directed force.   The wound was copiously irrigated with  Irrisept solution and normal saline using pule lavage.  Marcaine solution was injected into the periarticular soft tissue.  The wound was closed in layers using #1 Stratafix for the fascia, 2-0 Vicryl for the subcutaneous fat, 2-0 Monocryl for the deep dermal layer, 3-0 running Monocryl subcuticular stitch, and Dermabond for the skin.  Once the glue was fully dried, an  Aquacell Ag dressing was applied.  The patient was transported to the recovery room in stable condition.  Sponge, needle, and instrument counts were correct at the end of the case x2.  The patient tolerated the procedure well and there were no known complications.  Please note that a surgical assistant was a medical necessity for this procedure to perform it in a safe and expeditious manner. Assistant was necessary to provide appropriate retraction of vital neurovascular structures, to prevent femoral fracture, and to allow for anatomic placement of the prosthesis.

## 2020-09-17 NOTE — Anesthesia Procedure Notes (Signed)
Spinal  Patient location during procedure: OR Start time: 09/17/2020 9:08 AM End time: 09/17/2020 9:11 AM Reason for block: surgical anesthesia Staffing Performed: anesthesiologist  Anesthesiologist: Elmer Picker, MD Preanesthetic Checklist Completed: patient identified, IV checked, risks and benefits discussed, surgical consent, monitors and equipment checked, pre-op evaluation and timeout performed Spinal Block Patient position: sitting Prep: DuraPrep and site prepped and draped Patient monitoring: cardiac monitor, continuous pulse ox and blood pressure Approach: midline Location: L3-4 Injection technique: single-shot Needle Needle type: Pencan  Needle gauge: 24 G Needle length: 9 cm Assessment Sensory level: T6 Events: CSF return Additional Notes Functioning IV was confirmed and monitors were applied. Sterile prep and drape, including hand hygiene and sterile gloves were used. The patient was positioned and the spine was prepped. The skin was anesthetized with lidocaine.  Free flow of clear CSF was obtained prior to injecting local anesthetic into the CSF.  The spinal needle aspirated freely following injection.  The needle was carefully withdrawn.  The patient tolerated the procedure well.

## 2020-09-17 NOTE — Transfer of Care (Signed)
Immediate Anesthesia Transfer of Care Note  Patient: Frank Flowers  Procedure(s) Performed: Procedure(s): TOTAL HIP ARTHROPLASTY ANTERIOR APPROACH (Right)  Patient Location: PACU  Anesthesia Type:Spinal  Level of Consciousness:  sedated, patient cooperative and responds to stimulation  Airway & Oxygen Therapy:Patient Spontanous Breathing and Patient connected to face mask oxgen  Post-op Assessment:  Report given to PACU RN and Post -op Vital signs reviewed and stable  Post vital signs:  Reviewed and stable  Last Vitals:  Vitals:   09/17/20 0700 09/17/20 1109  BP: 127/89 102/80  Pulse: 88 77  Resp: 18 (!) 21  Temp: 36.7 C   SpO2: 98% 95%    Complications: No apparent anesthesia complications

## 2020-09-17 NOTE — Discharge Instructions (Signed)
? ?Dr. Kadience Macchi ?Joint Replacement Specialist ?[facility] ?[address] ?[place], [place] [address] ?[phone] ? ? ?TOTAL HIP REPLACEMENT POSTOPERATIVE DIRECTIONS ? ? ? ?Hip Rehabilitation, Guidelines Following Surgery  ? ?WEIGHT BEARING ?Weight bearing as tolerated with assist device (walker, cane, etc) as directed, use it as long as suggested by your surgeon or therapist, typically at least 4-6 weeks. ? ?The results of a hip operation are greatly improved after range of motion and muscle strengthening exercises. Follow all safety measures which are given to protect your hip. If any of these exercises cause increased pain or swelling in your joint, decrease the amount until you are comfortable again. Then slowly increase the exercises. Call your caregiver if you have problems or questions.  ? ?HOME CARE INSTRUCTIONS  ?Most of the following instructions are designed to prevent the dislocation of your new hip.  ?Remove items at home which could result in a fall. This includes throw rugs or furniture in walking pathways.  ?Continue medications as instructed at time of discharge. ?You may have some home medications which will be placed on hold until you complete the course of blood thinner medication. ?You may start showering once you are discharged home. Do not remove your dressing. ?Do not put on socks or shoes without following the instructions of your caregivers.   ?Sit on chairs with arms. Use the chair arms to help push yourself up when arising.  ?Arrange for the use of a toilet seat elevator so you are not sitting low.  ?Walk with walker as instructed.  ?You may resume a sexual relationship in one month or when given the OK by your caregiver.  ?Use walker as long as suggested by your caregivers.  ?You may put full weight on your legs and walk as much as is comfortable. ?Avoid periods of inactivity such as sitting longer than an hour when not asleep. This helps prevent blood  clots.  ?You may return to work once you are cleared by your surgeon.  ?Do not drive a car for 6 weeks or until released by your surgeon.  ?Do not drive while taking narcotics.  ?Wear elastic stockings for two weeks following surgery during the day but you may remove then at night.  ?Make sure you keep all of your appointments after your operation with all of your doctors and caregivers. You should call the office at the above phone number and make an appointment for approximately two weeks after the date of your surgery. ?Please pick up a stool softener and laxative for home use as long as you are requiring pain medications. ?ICE to the affected hip every three hours for 30 minutes at a time and then as needed for pain and swelling. Continue to use ice on the hip for pain and swelling from surgery. You may notice swelling that will progress down to the foot and ankle.  This is normal after surgery.  Elevate the leg when you are not up walking on it.   ?It is important for you to complete the blood thinner medication as prescribed by your doctor. ?Continue to use the breathing machine which will help keep your temperature down.  It is common for your temperature to cycle up and down following surgery, especially at night when you are not up moving around and exerting yourself.  The breathing machine keeps your lungs expanded and your temperature down. ? ?RANGE OF MOTION AND STRENGTHENING EXERCISES  ?These exercises are designed to help you   keep full movement of your hip joint. Follow your caregiver's or physical therapist's instructions. Perform all exercises about fifteen times, three times per day or as directed. Exercise both hips, even if you have had only one joint replacement. These exercises can be done on a training (exercise) mat, on the floor, on a table or on a bed. Use whatever works the best and is most comfortable for you. Use music or television while you are exercising so that the exercises are a  pleasant break in your day. This will make your life better with the exercises acting as a break in routine you can look forward to.  ?Lying on your back, slowly slide your foot toward your buttocks, raising your knee up off the floor. Then slowly slide your foot back down until your leg is straight again.  ?Lying on your back spread your legs as far apart as you can without causing discomfort.  ?Lying on your side, raise your upper leg and foot straight up from the floor as far as is comfortable. Slowly lower the leg and repeat.  ?Lying on your back, tighten up the muscle in the front of your thigh (quadriceps muscles). You can do this by keeping your leg straight and trying to raise your heel off the floor. This helps strengthen the largest muscle supporting your knee.  ?Lying on your back, tighten up the muscles of your buttocks both with the legs straight and with the knee bent at a comfortable angle while keeping your heel on the floor.  ? ?SKILLED REHAB INSTRUCTIONS: ?If the patient is transferred to a skilled rehab facility following release from the hospital, a list of the current medications will be sent to the facility for the patient to continue.  When discharged from the skilled rehab facility, please have the facility set up the patient's Home Health Physical Therapy prior to being released. Also, the skilled facility will be responsible for providing the patient with their medications at time of release from the facility to include their pain medication and their blood thinner medication. If the patient is still at the rehab facility at time of the two week follow up appointment, the skilled rehab facility will also need to assist the patient in arranging follow up appointment in our office and any transportation needs. ? ?POST-OPERATIVE OPIOID TAPER INSTRUCTIONS: ?It is important to wean off of your opioid medication as soon as possible. If you do not need pain medication after your surgery it is ok  to stop day one. ?Opioids include: ?Codeine, Hydrocodone(Norco, Vicodin), Oxycodone(Percocet, oxycontin) and hydromorphone amongst others.  ?Long term and even short term use of opiods can cause: ?Increased pain response ?Dependence ?Constipation ?Depression ?Respiratory depression ?And more.  ?Withdrawal symptoms can include ?Flu like symptoms ?Nausea, vomiting ?And more ?Techniques to manage these symptoms ?Hydrate well ?Eat regular healthy meals ?Stay active ?Use relaxation techniques(deep breathing, meditating, yoga) ?Do Not substitute Alcohol to help with tapering ?If you have been on opioids for less than two weeks and do not have pain than it is ok to stop all together.  ?Plan to wean off of opioids ?This plan should start within one week post op of your joint replacement. ?Maintain the same interval or time between taking each dose and first decrease the dose.  ?Cut the total daily intake of opioids by one tablet each day ?Next start to increase the time between doses. ?The last dose that should be eliminated is the evening dose.  ? ? ?MAKE   SURE YOU:  ?Understand these instructions.  ?Will watch your condition.  ?Will get help right away if you are not doing well or get worse. ? ?Pick up stool softner and laxative for home use following surgery while on pain medications. ?Do not remove your dressing. ?The dressing is waterproof--it is OK to take showers. ?Continue to use ice for pain and swelling after surgery. ?Do not use any lotions or creams on the incision until instructed by your surgeon. ?Total Hip Protocol. ? ?

## 2020-09-17 NOTE — Evaluation (Signed)
Physical Therapy Evaluation Patient Details Name: Frank Flowers MRN: [id number] DOB: September 24, 1960 Today's Date: 09/17/2020   History of Present Illness  Patient is 60 y.o. male s/p Rt THA anterior approach on 09/17/20 with PMH significant for OA, CAD, CHF, HTN, MI, vertigo.  Clinical Impression  Frank Flowers is a 60 y.o. male POD 0 s/p Rt THA. Patient reports independence with mobility at baseline. Patient is now limited by functional impairments (see PT problem list below) and requires min assist/guard for transfers and gait with RW. Patient was able to ambulate ~40 feet with RW and min assist. Patient instructed in exercise to facilitate strength and circulation to reduce risk of DVT. Patient will benefit from continued skilled PT interventions to address impairments and progress towards PLOF. Acute PT will follow to progress mobility and stair training in preparation for safe discharge home.     Follow Up Recommendations Follow surgeon's recommendation for DC plan and follow-up therapies    Equipment Recommendations  Rolling walker with 5" wheels;3in1 (PT)    Recommendations for Other Services       Precautions / Restrictions Precautions Precautions: Fall Restrictions Weight Bearing Restrictions: No Other Position/Activity Restrictions: WBAT      Mobility  Bed Mobility Overal bed mobility: Needs Assistance Bed Mobility: Supine to Sit     Supine to sit: Min assist     General bed mobility comments: cues ot use bed rail and pivot/raise trunk to EOB. Assist for Rt LE .    Transfers Overall transfer level: Needs assistance Equipment used: Rolling walker (2 wheeled) Transfers: Sit to/from Stand Sit to Stand: Min guard;From elevated surface         General transfer comment: EOB elevated significantly, guardign for safety with cues for hand placement to initiate power up. pt steady once standing.  Ambulation/Gait Ambulation/Gait assistance: Min assist Gait Distance (Feet):  40 Feet Assistive device: Rolling walker (2 wheeled) Gait Pattern/deviations: Step-to pattern;Decreased stride length;Decreased weight shift to right Gait velocity: decr   General Gait Details: cues for safe step pattern with RW, no overt LOB noted. assist to maintain safe walker proximity throughout. pt fatigued at end of gait.  Stairs            Wheelchair Mobility    Modified Rankin (Stroke Patients Only)       Balance Overall balance assessment: Needs assistance Sitting-balance support: Feet supported Sitting balance-Leahy Scale: Good     Standing balance support: During functional activity;Bilateral upper extremity supported Standing balance-Leahy Scale: Fair                               Pertinent Vitals/Pain Pain Assessment: 0-10 Pain Score: 4  Pain Location: Lt hip Pain Descriptors / Indicators: Aching Pain Intervention(s): Monitored during session;Limited activity within patient's tolerance;Repositioned;Premedicated before session;Ice applied    Home Living Family/patient expects to be discharged to:: Private residence Living Arrangements: Spouse/significant other Available Help at Discharge: Friend(s) Type of Home: Apartment Home Access: Stairs to enter Entrance Stairs-Rails: Doctor, general practice of Steps: 5 Home Layout: Two level Home Equipment: None      Prior Function Level of Independence: Independent               Hand Dominance   Dominant Hand: Right    Extremity/Trunk Assessment   Upper Extremity Assessment Upper Extremity Assessment: Overall WFL for tasks assessed    Lower Extremity Assessment Lower Extremity Assessment: Overall WFL for tasks  assessed    Cervical / Trunk Assessment Cervical / Trunk Assessment: Normal  Communication   Communication: No difficulties  Cognition Arousal/Alertness: Awake/alert Behavior During Therapy: WFL for tasks assessed/performed Overall Cognitive Status: Within  Functional Limits for tasks assessed                                        General Comments      Exercises Total Joint Exercises Ankle Circles/Pumps: AROM;Both;20 reps;Seated Quad Sets: AROM;Right;10 reps;Seated   Assessment/Plan    PT Assessment Patient needs continued PT services  PT Problem List Decreased strength;Decreased activity tolerance;Decreased range of motion;Decreased balance;Decreased mobility;Decreased knowledge of use of DME;Decreased knowledge of precautions;Pain;Obesity       PT Treatment Interventions DME instruction;Gait training;Stair training;Functional mobility training;Therapeutic activities;Therapeutic exercise;Balance training;Patient/family education    PT Goals (Current goals can be found in the Care Plan section)  Acute Rehab PT Goals Patient Stated Goal: get back independence and not be hurting PT Goal Formulation: With patient Time For Goal Achievement: 09/24/20 Potential to Achieve Goals: Good    Frequency 7X/week   Barriers to discharge        Co-evaluation               AM-PAC PT "6 Clicks" Mobility  Outcome Measure Help needed turning from your back to your side while in a flat bed without using bedrails?: A Little Help needed moving from lying on your back to sitting on the side of a flat bed without using bedrails?: A Little Help needed moving to and from a bed to a chair (including a wheelchair)?: A Little Help needed standing up from a chair using your arms (e.g., wheelchair or bedside chair)?: A Little Help needed to walk in hospital room?: A Little Help needed climbing 3-5 steps with a railing? : A Little 6 Click Score: 18    End of Session Equipment Utilized During Treatment: Gait belt Activity Tolerance: Patient tolerated treatment well Patient left: in chair;with call bell/phone within reach;with chair alarm set Nurse Communication: Mobility status PT Visit Diagnosis: Muscle weakness (generalized)  (M62.81);Difficulty in walking, not elsewhere classified (R26.2)    Time: 2878-6767 PT Time Calculation (min) (ACUTE ONLY): 18 min   Charges:   PT Evaluation $PT Eval Low Complexity: 1 Low          Wynn Maudlin, DPT Acute Rehabilitation Services Office [phone] Pager [phone]   Anitra Lauth 09/17/2020, 5:35 PM

## 2020-09-17 NOTE — Plan of Care (Signed)
  Problem: Activity: Goal: Ability to avoid complications of mobility impairment will improve Outcome: Progressing Goal: Ability to tolerate increased activity will improve Outcome: Progressing   Problem: Pain Management: Goal: Pain level will decrease with appropriate interventions Outcome: Progressing   

## 2020-09-18 ENCOUNTER — Encounter (HOSPITAL_COMMUNITY): Payer: Self-pay | Admitting: Orthopedic Surgery

## 2020-09-18 DIAGNOSIS — M1611 Unilateral primary osteoarthritis, right hip: Secondary | ICD-10-CM | POA: Diagnosis not present

## 2020-09-18 LAB — CBC
HCT: 48.1 % (ref 39.0–52.0)
Hemoglobin: 15.9 g/dL (ref 13.0–17.0)
MCH: 30.2 pg (ref 26.0–34.0)
MCHC: 33.1 g/dL (ref 30.0–36.0)
MCV: 91.3 fL (ref 80.0–100.0)
Platelets: 158 10*3/uL (ref 150–400)
RBC: 5.27 MIL/uL (ref 4.22–5.81)
RDW: 15.1 % (ref 11.5–15.5)
WBC: 16.9 10*3/uL — ABNORMAL HIGH (ref 4.0–10.5)
nRBC: 0 % (ref 0.0–0.2)

## 2020-09-18 LAB — BASIC METABOLIC PANEL
Anion gap: 8 (ref 5–15)
BUN: 14 mg/dL (ref 6–20)
CO2: 26 mmol/L (ref 22–32)
Calcium: 8.5 mg/dL — ABNORMAL LOW (ref 8.9–10.3)
Chloride: 104 mmol/L (ref 98–111)
Creatinine, Ser: 1 mg/dL (ref 0.61–1.24)
GFR, Estimated: >60 mL/min (ref >60)
Glucose, Bld: 118 mg/dL — ABNORMAL HIGH (ref 70–99)
Potassium: 4 mmol/L (ref 3.5–5.1)
Sodium: 138 mmol/L (ref 135–145)

## 2020-09-18 MED ORDER — HYDROCODONE-ACETAMINOPHEN 5-325 MG PO TABS: 1.0000 | ORAL_TABLET | ORAL | 0 refills | Status: DC | PRN

## 2020-09-18 MED ORDER — DOCUSATE SODIUM 100 MG PO CAPS: 100.0000 mg | ORAL_CAPSULE | Freq: Two times a day (BID) | ORAL | 0 refills | Status: DC

## 2020-09-18 MED ORDER — ASPIRIN 81 MG PO CHEW: 81.0000 mg | CHEWABLE_TABLET | Freq: Two times a day (BID) | ORAL | 0 refills | Status: AC

## 2020-09-18 MED ORDER — SENNA 8.6 MG PO TABS: 2.0000 | ORAL_TABLET | Freq: Every day | ORAL | 0 refills | Status: DC

## 2020-09-18 MED ORDER — ONDANSETRON HCL 4 MG PO TABS: 4.0000 mg | ORAL_TABLET | Freq: Four times a day (QID) | ORAL | 0 refills | Status: DC | PRN

## 2020-09-18 NOTE — Progress Notes (Signed)
    Subjective:  Patient reports pain as mild to moderate.  Denies N/V/CP/SOB. No c/o.  Objective:   VITALS:   Vitals:   09/17/20 1800 09/17/20 2335 09/18/20 0352 09/18/20 0731  BP: (!) 143/103 (!) 145/98 128/82 116/86  Pulse: 75 76 87 90  Resp: 18 20 20 20   Temp:  97.7 F (36.5 C) 99.5 F (37.5 C) 98.6 F (37 C)  TempSrc:  Oral Oral Oral  SpO2: 97% 97% 95% 97%  Weight:      Height:        NAD ABD soft Sensation intact distally Intact pulses distally Dorsiflexion/Plantar flexion intact Incision: dressing C/D/I Compartment soft   Lab Results  Component Value Date   WBC 16.9 (H) 09/18/2020   HGB 15.9 09/18/2020   HCT 48.1 09/18/2020   MCV 91.3 09/18/2020   PLT 158 09/18/2020   BMET    Component Value Date/Time   NA 138 09/18/2020 0325   NA 140 10/11/2019 1553   K 4.0 09/18/2020 0325   CL 104 09/18/2020 0325   CO2 26 09/18/2020 0325   GLUCOSE 118 (H) 09/18/2020 0325   BUN 14 09/18/2020 0325   BUN 12 10/11/2019 1553   CREATININE 1.00 09/18/2020 0325   CALCIUM 8.5 (L) 09/18/2020 0325   GFRNONAA >60 09/18/2020 0325   GFRAA 89 10/11/2019 1553     Assessment/Plan: 1 Day Post-Op   Active Problems:   Osteoarthritis of right hip   WBAT with walker DVT ppx: Aspirin, SCDs, TEDS PO pain control PT/OT Dispo: D/C home with HEP   10/13/2019 Frank Flowers 09/18/2020, 8:05 AM   11/18/2020, MD [phone] [facility] is now [facility]  [place] [address], [place], [place] Phone: [phone] [email or web address] Facebook  [phone]

## 2020-09-18 NOTE — Progress Notes (Signed)
Physical Therapy Treatment Patient Details Name: Frank Flowers MRN: [id number] DOB: Nov 07, 1960 Today's Date: 09/18/2020    History of Present Illness Patient is 60 y.o. male s/p Rt THA anterior approach on 09/17/20 with PMH significant for OA, CAD, CHF, HTN, MI, vertigo.    PT Comments    Pt  with raspy breath sounds however RN in room and obtained vitals prior to ambulating.  Pt performed LE exercises and ambulated however only short distance due to dizziness.  BP 99/79 mmHg HR 71 upon return to room from ambulating.  Pt does not appear safe to d/c home today.   Follow Up Recommendations  Follow surgeon's recommendation for DC plan and follow-up therapies     Equipment Recommendations  Rolling walker with 5" wheels;3in1 (PT)    Recommendations for Other Services       Precautions / Restrictions Precautions Precautions: Fall Restrictions Weight Bearing Restrictions: No Other Position/Activity Restrictions: WBAT    Mobility  Bed Mobility Overal bed mobility: Needs Assistance Bed Mobility: Sit to Supine     Supine to sit: Min assist Sit to supine: Min assist   General bed mobility comments: assist for Rt LE    Transfers Overall transfer level: Needs assistance Equipment used: Rolling walker (2 wheeled) Transfers: Sit to/from Stand Sit to Stand: Min assist         General transfer comment: verbal cues for UE and LE positioning; assist to rise and steady as well as control descent  Ambulation/Gait Ambulation/Gait assistance: Min guard Gait Distance (Feet): 40 Feet Assistive device: Rolling walker (2 wheeled) Gait Pattern/deviations: Step-to pattern;Decreased stride length;Decreased weight shift to right Gait velocity: decr   General Gait Details: cues for sequence with RW, no overt LOB noted; pt reported dizziness so limited ambulation; BP 99/79 mmHg HR 71   Stairs   Wheelchair Mobility    Modified Rankin (Stroke Patients Only)       Balance                                             Cognition Arousal/Alertness: Awake/alert Behavior During Therapy: WFL for tasks assessed/performed Overall Cognitive Status: Within Functional Limits for tasks assessed                                        Exercises Total Joint Exercises Ankle Circles/Pumps: AROM;Both;20 reps;Seated Quad Sets: AROM;Right;10 reps Heel Slides: AAROM;Right;10 reps Hip ABduction/ADduction: AAROM;Right;10 reps;Standing;Supine;AROM Long Arc Quad: AROM;Right;Seated;10 reps Knee Flexion: AROM;Right;Standing;10 reps Marching in Standing: AROM;Right;Standing;10 reps    General Comments        Pertinent Vitals/Pain Pain Assessment: 0-10 Pain Score: 6  Pain Location: Lt hip Pain Descriptors / Indicators: Aching;Sore;Tightness Pain Intervention(s): Repositioned;Monitored during session    Home Living                      Prior Function            PT Goals (current goals can now be found in the care plan section) Progress towards PT goals: Progressing toward goals    Frequency    7X/week      PT Plan Current plan remains appropriate    Co-evaluation              AM-PAC PT "6  Clicks" Mobility   Outcome Measure  Help needed turning from your back to your side while in a flat bed without using bedrails?: A Little Help needed moving from lying on your back to sitting on the side of a flat bed without using bedrails?: A Little Help needed moving to and from a bed to a chair (including a wheelchair)?: A Little Help needed standing up from a chair using your arms (e.g., wheelchair or bedside chair)?: A Little Help needed to walk in hospital room?: A Little Help needed climbing 3-5 steps with a railing? : A Lot 6 Click Score: 17    End of Session Equipment Utilized During Treatment: Gait belt Activity Tolerance: Other (comment) (limited by dizziness) Patient left: with call bell/phone within reach;in  bed;with bed alarm set Nurse Communication: Mobility status PT Visit Diagnosis: Muscle weakness (generalized) (M62.81);Difficulty in walking, not elsewhere classified (R26.2)     Time: 5462-7035 PT Time Calculation (min) (ACUTE ONLY): 22 min  Charges:  $Therapeutic Exercise: 8-22 mins                     Thomasene Mohair PT, DPT Acute Rehabilitation Services Pager: [phone] Office: [phone]    Janan Halter [place] 09/18/2020, 2:27 PM

## 2020-09-18 NOTE — Plan of Care (Signed)
  Problem: Activity: Goal: Ability to tolerate increased activity will improve Outcome: Progressing   Problem: Clinical Measurements: Goal: Postoperative complications will be avoided or minimized Outcome: Progressing   Problem: Pain Management: Goal: Pain level will decrease with appropriate interventions Outcome: Progressing   Problem: Skin Integrity: Goal: Will show signs of wound healing Outcome: Progressing

## 2020-09-18 NOTE — TOC Transition Note (Signed)
Transition of Care [facility]) - CM/SW Discharge Note  Patient Details  Name: Frank Flowers MRN: [id number] Date of Birth: [birth year]  [facility] of [facility] CM/SW Contact:  Ewing Schlein, LCSW Phone Number: 09/18/2020, 1:37 PM  Clinical Narrative: Patient is expected to discharge home after working with PT. Patient will discharge home with a home exercise program (HEP). Patient will need a rolling walker and 3N1. [employer] to deliver DME to patient's room. TOC signing off.  Final next level of care: Home/Self Care Barriers to Discharge: No Barriers Identified  Patient Goals and CMS Choice CMS Medicare.gov Compare Post Acute Care list provided to:: Patient Choice offered to / list presented to : Patient  Discharge Plan and Services      DME Arranged: 3-N-1, Walker rolling DME Agency: [employer] Date DME Agency Contacted: 09/18/20 Representative spoke with at DME Agency: Harrold Donath  Readmission Risk Interventions No flowsheet data found.

## 2020-09-18 NOTE — Progress Notes (Signed)
Patient complaining of feeling dizzy, blood pressure dropped to 99/79 after ambulating with PT. MD notified.

## 2020-09-18 NOTE — Plan of Care (Signed)
  Problem: Activity: Goal: Ability to tolerate increased activity will improve 09/18/2020 2246 by Sherren Kerns, RN Outcome: Progressing 09/18/2020 1941 by Sherren Kerns, RN Outcome: Progressing   Problem: Clinical Measurements: Goal: Postoperative complications will be avoided or minimized 09/18/2020 2246 by Sherren Kerns, RN Outcome: Progressing 09/18/2020 1941 by Sherren Kerns, RN Outcome: Progressing   Problem: Education: Goal: Knowledge of General Education information will improve Description: Including pain rating scale, medication(s)/side effects and non-pharmacologic comfort measures Outcome: Progressing   Problem: Health Behavior/Discharge Planning: Goal: Ability to manage health-related needs will improve Outcome: Progressing   Problem: Elimination: Goal: Will not experience complications related to bowel motility Outcome: Progressing

## 2020-09-18 NOTE — Progress Notes (Signed)
Physical Therapy Treatment Patient Details Name: Frank Flowers MRN: [id number] DOB: 1960/12/27 Today's Date: 09/18/2020    History of Present Illness Patient is 60 y.o. male s/p Rt THA anterior approach on 09/17/20 with PMH significant for OA, CAD, CHF, HTN, MI, vertigo.    PT Comments    Pt ambulated in hallway and practiced safe stair technique. Pt fatigued quickly and required seated rest breaks.   Will return to review exercises with pt this afternoon prior to d/c.    Follow Up Recommendations  Follow surgeon's recommendation for DC plan and follow-up therapies     Equipment Recommendations  Rolling walker with 5" wheels;3in1 (PT)    Recommendations for Other Services       Precautions / Restrictions Precautions Precautions: Fall Restrictions Weight Bearing Restrictions: No RLE Weight Bearing: Weight bearing as tolerated Other Position/Activity Restrictions: WBAT    Mobility  Bed Mobility Overal bed mobility: Needs Assistance Bed Mobility: Supine to Sit     Supine to sit: Min assist     General bed mobility comments: assist for Rt LE over EOB    Transfers Overall transfer level: Needs assistance Equipment used: Rolling walker (2 wheeled) Transfers: Sit to/from Stand Sit to Stand: Min guard;From elevated surface         General transfer comment: verbal cues for UE and LE positioning  Ambulation/Gait Ambulation/Gait assistance: Min guard Gait Distance (Feet): 180 Feet Assistive device: Rolling walker (2 wheeled) Gait Pattern/deviations: Step-to pattern;Decreased stride length;Decreased weight shift to right Gait velocity: decr   General Gait Details: cues for safe step pattern with RW, no overt LOB noted; pt fatigues quickly and required seated rest break prior to attempting stairs (also required ride back to room in recliner after stairs)   Stairs Stairs: Yes Stairs assistance: Min guard Stair Management: Step to pattern;Forwards;Two rails Number of  Stairs: 3 General stair comments: verbal cues for sequence and safety   Wheelchair Mobility    Modified Rankin (Stroke Patients Only)       Balance                                            Cognition Arousal/Alertness: Awake/alert Behavior During Therapy: WFL for tasks assessed/performed Overall Cognitive Status: Within Functional Limits for tasks assessed                                        Exercises      General Comments        Pertinent Vitals/Pain Pain Assessment: 0-10 Pain Score: 4  Pain Location: Lt hip Pain Descriptors / Indicators: Aching;Sore;Tightness Pain Intervention(s): Premedicated before session;Repositioned;Monitored during session    Home Living                      Prior Function            PT Goals (current goals can now be found in the care plan section) Progress towards PT goals: Progressing toward goals    Frequency    7X/week      PT Plan Current plan remains appropriate    Co-evaluation              AM-PAC PT "6 Clicks" Mobility   Outcome Measure  Help needed turning from your back  to your side while in a flat bed without using bedrails?: A Little Help needed moving from lying on your back to sitting on the side of a flat bed without using bedrails?: A Little Help needed moving to and from a bed to a chair (including a wheelchair)?: A Little Help needed standing up from a chair using your arms (e.g., wheelchair or bedside chair)?: A Little Help needed to walk in hospital room?: A Little Help needed climbing 3-5 steps with a railing? : A Little 6 Click Score: 18    End of Session Equipment Utilized During Treatment: Gait belt Activity Tolerance: Patient tolerated treatment well Patient left: in chair;with call bell/phone within reach;with chair alarm set Nurse Communication: Mobility status PT Visit Diagnosis: Muscle weakness (generalized) (M62.81);Difficulty in  walking, not elsewhere classified (R26.2)     Time: 5573-2202 PT Time Calculation (min) (ACUTE ONLY): 25 min  Charges:  $Gait Training: 23-37 mins                     Thomasene Mohair PT, DPT Acute Rehabilitation Services Pager: [phone] Office: [phone]    Janan Halter [place] 09/18/2020, 12:25 PM

## 2020-09-18 NOTE — Anesthesia Postprocedure Evaluation (Signed)
Anesthesia Post Note  Patient: Frank Flowers  Procedure(s) Performed: TOTAL HIP ARTHROPLASTY ANTERIOR APPROACH (Right: Hip)     Patient location during evaluation: PACU Anesthesia Type: Spinal Level of consciousness: oriented and awake and alert Pain management: pain level controlled Vital Signs Assessment: post-procedure vital signs reviewed and stable Respiratory status: spontaneous breathing, respiratory function stable and patient connected to nasal cannula oxygen Cardiovascular status: blood pressure returned to baseline and stable Postop Assessment: no headache, no backache and no apparent nausea or vomiting Anesthetic complications: no   No notable events documented.  Last Vitals:  Vitals:   09/18/20 0352 09/18/20 0731  BP: 128/82 116/86  Pulse: 87 90  Resp: 20 20  Temp: 37.5 C 37 C  SpO2: 95% 97%    Last Pain:  Vitals:   09/18/20 0731  TempSrc: Oral  PainSc: 3                  Frank Flowers
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
## 2020-09-19 DIAGNOSIS — M1611 Unilateral primary osteoarthritis, right hip: Secondary | ICD-10-CM | POA: Diagnosis not present

## 2020-09-19 LAB — CBC
HCT: 40.6 % (ref 39.0–52.0)
Hemoglobin: 13.3 g/dL (ref 13.0–17.0)
MCH: 30 pg (ref 26.0–34.0)
MCHC: 32.8 g/dL (ref 30.0–36.0)
MCV: 91.6 fL (ref 80.0–100.0)
Platelets: 156 10*3/uL (ref 150–400)
RBC: 4.43 MIL/uL (ref 4.22–5.81)
RDW: 14.9 % (ref 11.5–15.5)
WBC: 20.3 10*3/uL — ABNORMAL HIGH (ref 4.0–10.5)
nRBC: 0 % (ref 0.0–0.2)

## 2020-09-19 NOTE — Plan of Care (Signed)
Discharge instructions given to the patient. °

## 2020-09-19 NOTE — Progress Notes (Signed)
Physical Therapy Treatment Patient Details Name: Frank Flowers MRN: [id number] DOB: [birth year] Today's Date: 09/19/2020    History of Present Illness Patient is 60 y.o. male s/p Rt THA anterior approach on 09/17/20 with PMH significant for OA, CAD, CHF, HTN, MI, vertigo.    PT Comments    Pt performed a couple exercises in bed and then ambulated in hallway.  Pt also practiced safe stair technique.  Pt denies dizziness today.  Pt also looked over HEP handout and had no further questions.  Pt feels ready for d/c home today.    Follow Up Recommendations  Follow surgeon's recommendation for DC plan and follow-up therapies     Equipment Recommendations  Rolling walker with 5" wheels;3in1 (PT)    Recommendations for Other Services       Precautions / Restrictions Precautions Precautions: Fall Restrictions Weight Bearing Restrictions: No RLE Weight Bearing: Weight bearing as tolerated    Mobility  Bed Mobility Overal bed mobility: Needs Assistance Bed Mobility: Supine to Sit     Supine to sit: Min guard     General bed mobility comments: verbal cues for self assist for Rt LE    Transfers Overall transfer level: Needs assistance Equipment used: Rolling walker (2 wheeled) Transfers: Sit to/from Stand Sit to Stand: Min guard         General transfer comment: verbal cues for UE and LE positioning  Ambulation/Gait Ambulation/Gait assistance: Min guard Gait Distance (Feet): 140 Feet Assistive device: Rolling walker (2 wheeled) Gait Pattern/deviations: Step-to pattern;Decreased stride length;Decreased weight shift to right Gait velocity: improved speed since yesterday   General Gait Details: cues for sequence with RW, no overt LOB noted; no dizziness reported today   Stairs Stairs: Yes Stairs assistance: Min guard Stair Management: Step to pattern;Forwards;One rail Left Number of Stairs: 3 General stair comments: verbal cues for sequence and safety   Wheelchair  Mobility    Modified Rankin (Stroke Patients Only)       Balance                                            Cognition Arousal/Alertness: Awake/alert Behavior During Therapy: WFL for tasks assessed/performed Overall Cognitive Status: Within Functional Limits for tasks assessed                                        Exercises Total Joint Exercises Ankle Circles/Pumps: AROM;Both;20 reps Heel Slides: AAROM;Right;10 reps Hip ABduction/ADduction: AAROM;Right;10 reps;Supine    General Comments        Pertinent Vitals/Pain Pain Assessment: 0-10 Pain Score: 4  Pain Location: Lt hip Pain Descriptors / Indicators: Aching;Sore;Tightness Pain Intervention(s): Monitored during session;Repositioned    Home Living                      Prior Function            PT Goals (current goals can now be found in the care plan section) Progress towards PT goals: Progressing toward goals    Frequency    7X/week      PT Plan Current plan remains appropriate    Co-evaluation              AM-PAC PT "6 Clicks" Mobility   Outcome Measure  Help needed  turning from your back to your side while in a flat bed without using bedrails?: A Little Help needed moving from lying on your back to sitting on the side of a flat bed without using bedrails?: A Little Help needed moving to and from a bed to a chair (including a wheelchair)?: A Little Help needed standing up from a chair using your arms (e.g., wheelchair or bedside chair)?: A Little Help needed to walk in hospital room?: A Little Help needed climbing 3-5 steps with a railing? : A Little 6 Click Score: 18    End of Session Equipment Utilized During Treatment: Gait belt Activity Tolerance: Patient tolerated treatment well Patient left: in chair;with call bell/phone within reach Nurse Communication: Mobility status PT Visit Diagnosis: Muscle weakness (generalized) (M62.81);Difficulty  in walking, not elsewhere classified (R26.2)     Time: 9622-2979 PT Time Calculation (min) (ACUTE ONLY): 16 min  Charges:  $Gait Training: 8-22 mins                     Paulino Door, DPT Acute Rehabilitation Services Pager: [phone] Office: [phone]  Kati L Payson 09/19/2020, 10:06 AM

## 2020-09-19 NOTE — Plan of Care (Signed)
  Problem: Activity: Goal: Ability to avoid complications of mobility impairment will improve Outcome: Progressing   Problem: Pain Management: Goal: Pain level will decrease with appropriate interventions Outcome: Progressing   Problem: Clinical Measurements: Goal: Ability to maintain clinical measurements within normal limits will improve Outcome: Progressing

## 2020-09-19 NOTE — Progress Notes (Signed)
    Subjective:  Patient reports pain as mild to moderate.  Denies N/V/CP/SOB.   Objective:   VITALS:   Vitals:   09/18/20 1400 09/18/20 2129 09/19/20 0148 09/19/20 0551  BP: 106/71 122/87 120/80 137/85  Pulse: 67 69 69 67  Resp: 18 18 17 17   Temp:  98.3 F (36.8 C) 98.1 F (36.7 C) 97.6 F (36.4 C)  TempSrc:      SpO2: 96% 98% 98% 98%  Weight:      Height:        NAD ABD soft Neurovascular intact Sensation intact distally Intact pulses distally Dorsiflexion/Plantar flexion intact Incision: dressing C/D/I   Lab Results  Component Value Date   WBC 20.3 (H) 09/19/2020   HGB 13.3 09/19/2020   HCT 40.6 09/19/2020   MCV 91.6 09/19/2020   PLT 156 09/19/2020   BMET    Component Value Date/Time   NA 138 09/18/2020 0325   NA 140 10/11/2019 1553   K 4.0 09/18/2020 0325   CL 104 09/18/2020 0325   CO2 26 09/18/2020 0325   GLUCOSE 118 (H) 09/18/2020 0325   BUN 14 09/18/2020 0325   BUN 12 10/11/2019 1553   CREATININE 1.00 09/18/2020 0325   CALCIUM 8.5 (L) 09/18/2020 0325   GFRNONAA >60 09/18/2020 0325   GFRAA 89 10/11/2019 1553     Assessment/Plan: 2 Days Post-Op   Principal Problem:   Osteoarthritis of right hip   WBAT with walker DVT ppx: Aspirin, SCDs, TEDS PO pain control PT/OT Dispo: D/C today if cleared by therapy     10/13/2019 09/19/2020, 7:48 AM   [facility] is now [facility] [address], [place], [place] Phone: [phone] [email or web address] Facebook  [phone]

## 2020-09-23 NOTE — Interval H&P Note (Signed)
History and Physical Interval Note:  09/17/2020 12:06 PM  Frank Flowers  has presented today for surgery, with the diagnosis of right hip osteoarthritis.  The various methods of treatment have been discussed with the patient and family. After consideration of risks, benefits and other options for treatment, the patient has consented to  Procedure(s): TOTAL HIP ARTHROPLASTY ANTERIOR APPROACH (Right) as a surgical intervention.  The patient's history has been reviewed, patient examined, no change in status, stable for surgery.  I have reviewed the patient's chart and labs.  Questions were answered to the patient's satisfaction.     Iline Oven [facility]

## 2021-05-20 DIAGNOSIS — I340 Nonrheumatic mitral (valve) insufficiency: Secondary | ICD-10-CM | POA: Diagnosis not present

## 2021-08-04 IMAGING — DX DG CHEST 1V PORT
1 series · 1 of 1 positions shown · non-contrast
Comparison: 03/27/2019

CLINICAL DATA: Shortness of breath.  History of CHF

EXAM:
PORTABLE CHEST 1 VIEW

[chest]
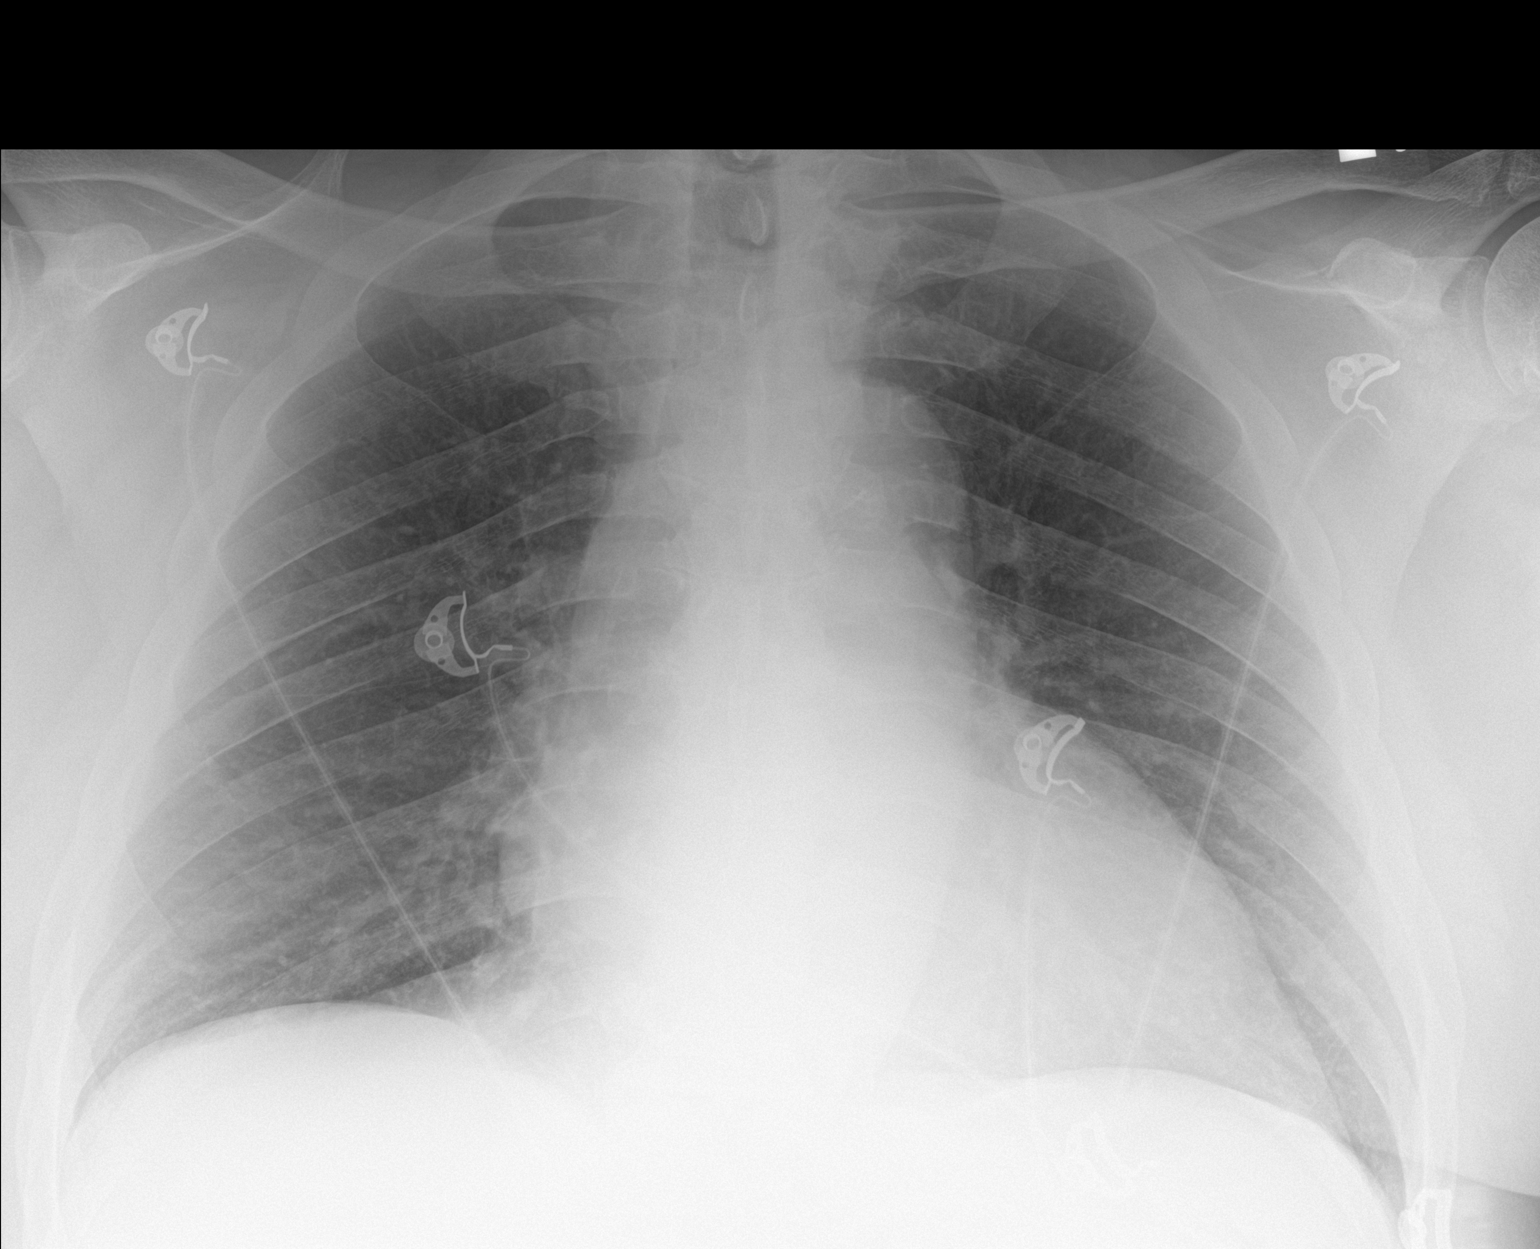

[1 of 1 positions shown; findings below may reference images not displayed]

FINDINGS: Chronic cardiomegaly accentuated by apical fat pad. Stable aortic
and hilar contours. There is no edema, consolidation, effusion, or
pneumothorax.
IMPRESSION: Chronic cardiomegaly without failure.

## 2022-01-10 IMAGING — RF DG HIP (WITH PELVIS) OPERATIVE*R*
1 series · 2 of 2 positions shown · non-contrast
Comparison: None.

CLINICAL DATA: Right hip arthroplasty

EXAM:
OPERATIVE right HIP (WITH PELVIS IF PERFORMED) 2 VIEWS
TECHNIQUE: Fluoroscopic spot image(s) were submitted for interpretation
post-operatively.

[Series 1: unknown protocol · 0.20mm/px · 2 of 2 slices shown]
[im 1/2]
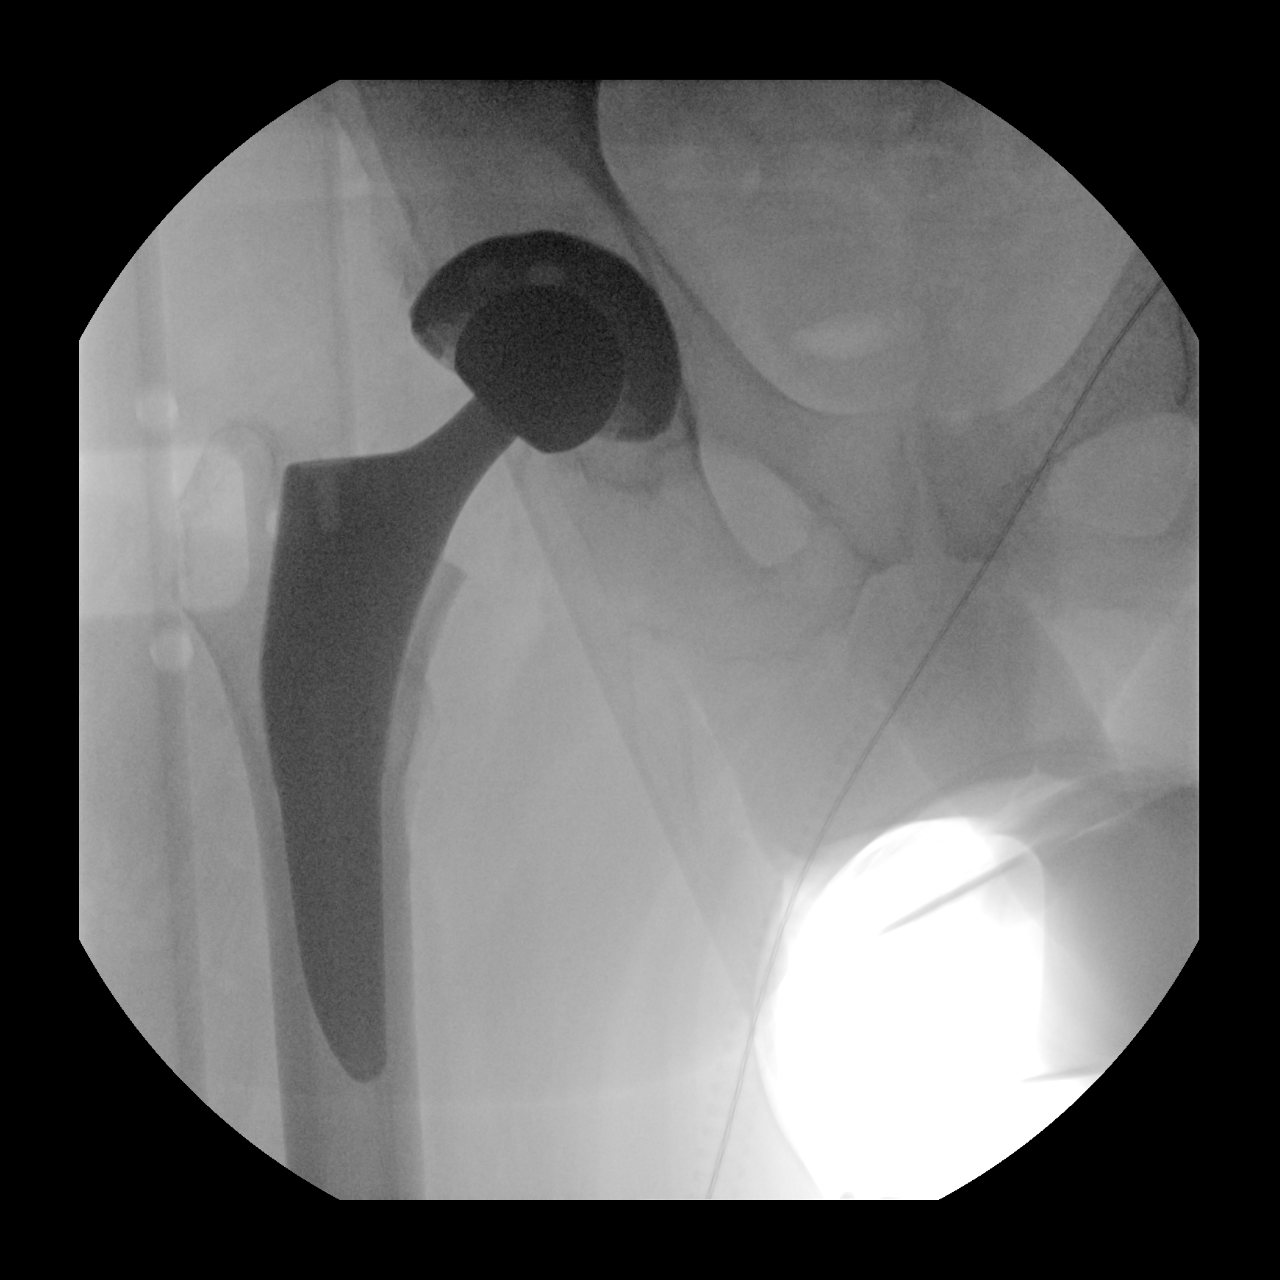
[im 2/2]
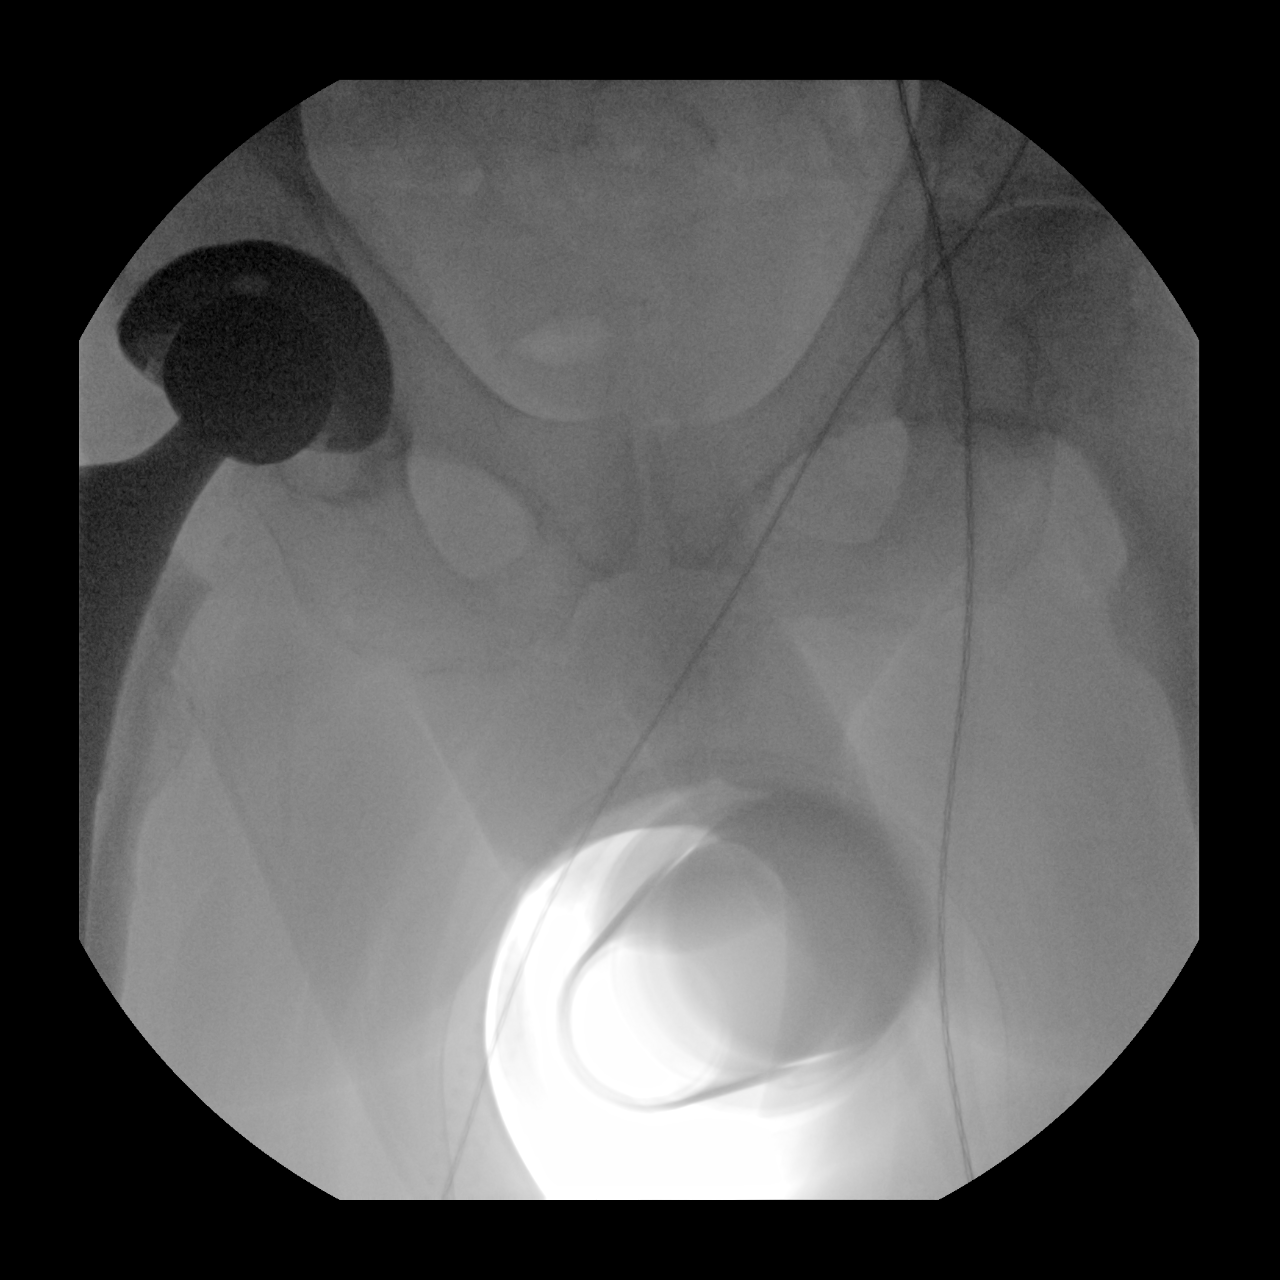

[2 of 2 positions shown; findings below may reference images not displayed]

FINDINGS: Intraoperative images during right hip arthroplasty. Hardware is
intact without evidence of loosening or fracture. Normal alignment.
IMPRESSION: Intraoperative images during right hip arthroplasty. No evidence of
immediate hardware complication.

## 2022-01-10 IMAGING — DX DG PORTABLE PELVIS
1 series · 2 of 2 positions shown · non-contrast
Comparison: Fluoroscopic images of same day.

CLINICAL DATA: Status post right total hip replacement.

EXAM:
PORTABLE PELVIS 1-2 VIEWS

[Series 1: pelvis ap · 0.14mm/px · 2 of 2 slices shown]
[im 1/2]
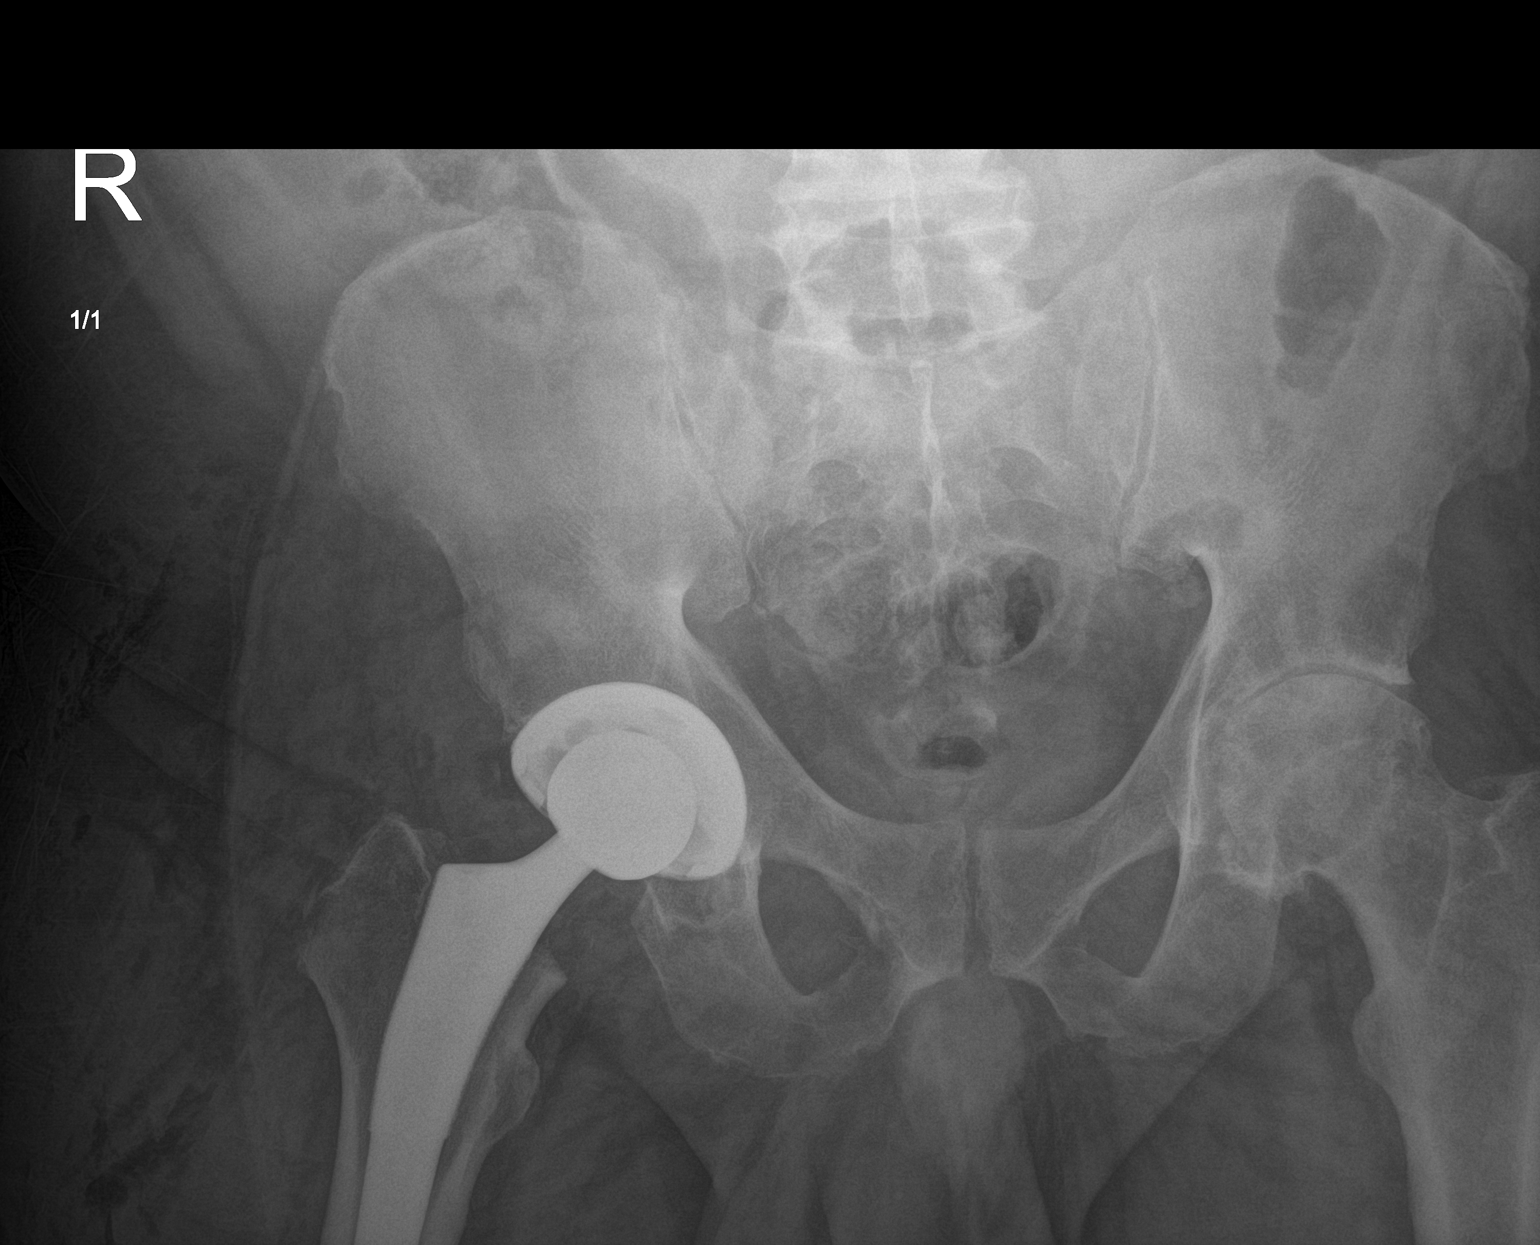
[im 2/2]
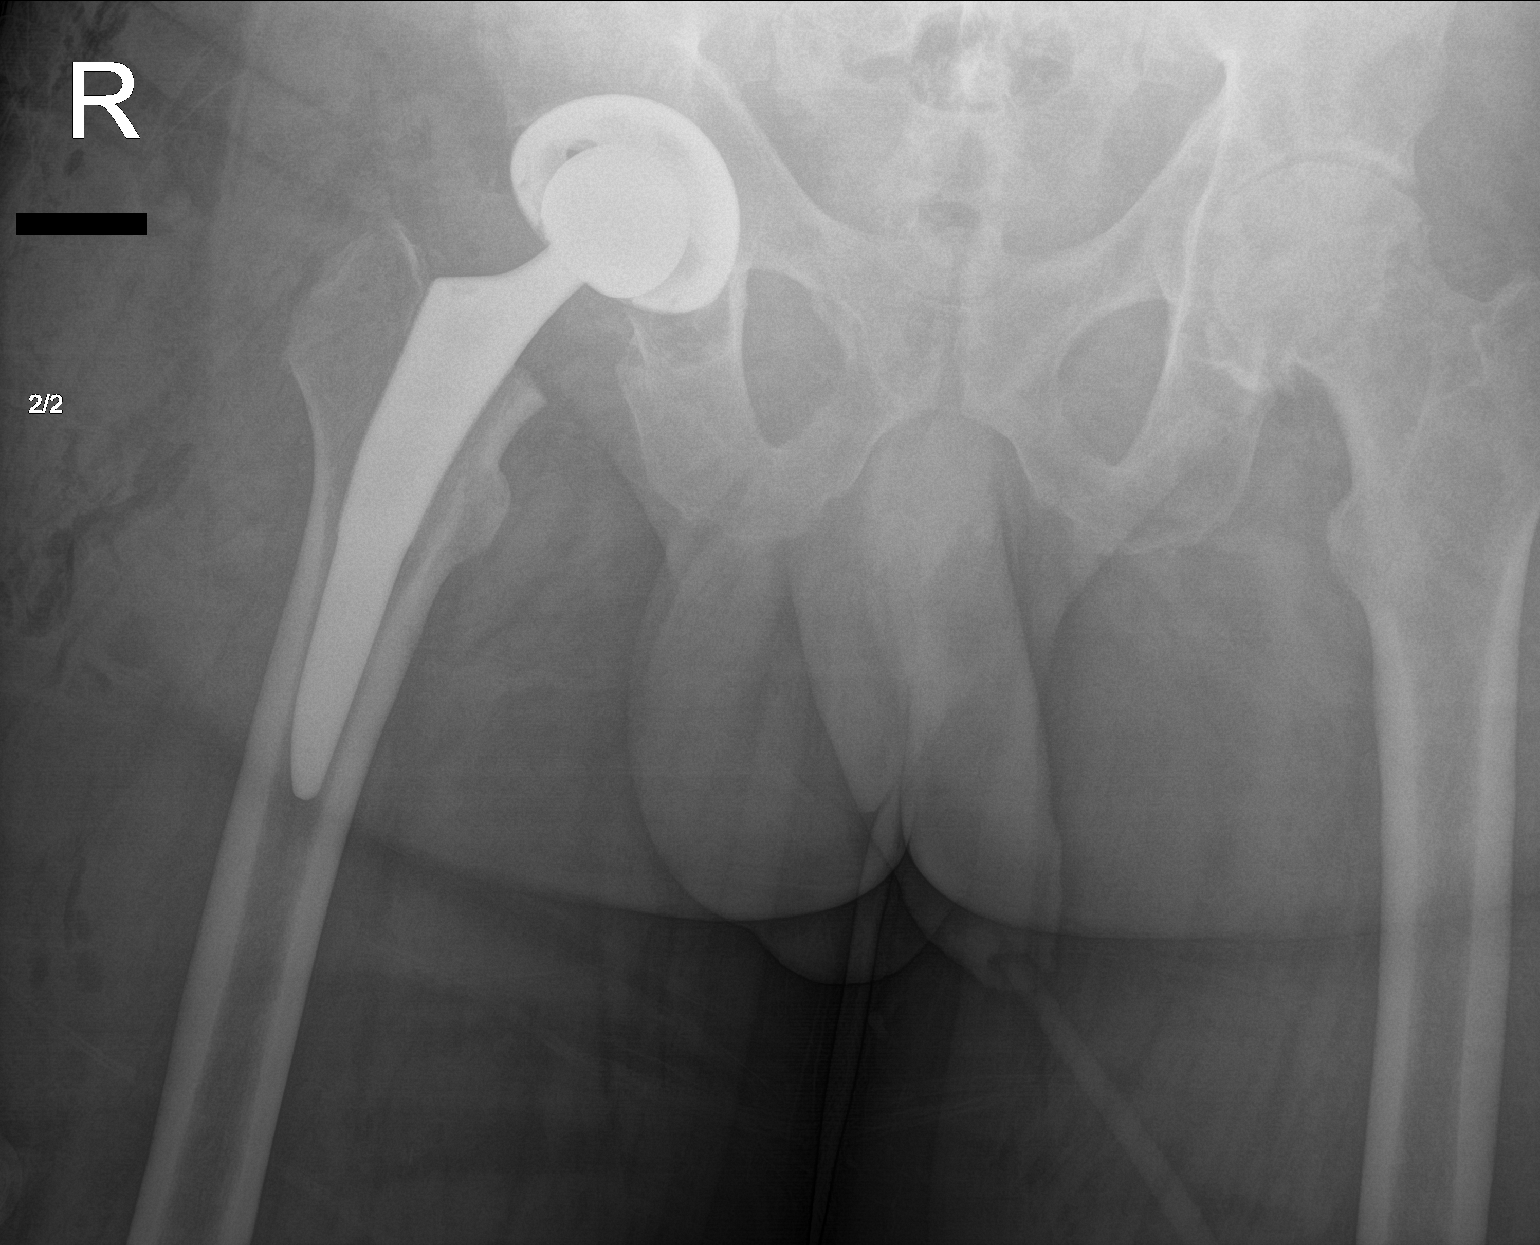

[2 of 2 positions shown; findings below may reference images not displayed]

FINDINGS: The right femoral and acetabular components are well situated.
Expected postoperative changes are seen in the surrounding soft
tissues.
IMPRESSION: Status post right total hip arthroplasty.

## 2022-08-24 ENCOUNTER — Encounter: Payer: Self-pay | Admitting: Podiatrist

## 2022-08-24 ENCOUNTER — Ambulatory Visit (INDEPENDENT_AMBULATORY_CARE_PROVIDER_SITE_OTHER): Payer: Medicaid Other | Admitting: Podiatrist

## 2022-08-24 DIAGNOSIS — L603 Nail dystrophy: Secondary | ICD-10-CM

## 2022-08-24 DIAGNOSIS — B351 Tinea unguium: Secondary | ICD-10-CM

## 2022-08-24 DIAGNOSIS — L600 Ingrowing nail: Secondary | ICD-10-CM

## 2022-08-24 MED ORDER — TERBINAFINE HCL 250 MG PO TABS: 250.0000 mg | ORAL_TABLET | Freq: Every day | ORAL | 0 refills | Status: DC

## 2022-08-24 NOTE — Progress Notes (Addendum)
Chief Complaint  Patient presents with   Nail Problem    Fungus on nails. ingrown nails, discolored (Black) bilateral 2nd and 3rd toes Right 2nd toe severe pain, redness, and swelling   Ingrown Toenail    Both great toes ingrown     HPI: Patient is 62 y.o. male who presents today for a painful thickened right second toenail.  He states the toe is painful with pressure and he would like to treat the nail thickness.  Denies any liver or kidney problems in the past.    Patient Active Problem List   Diagnosis Date Noted   Osteoarthritis of right hip 09/17/2020   Ischemic cardiomyopathy 08/21/2019   SOB (shortness of breath) 03/28/2019   Acute CHF (congestive heart failure) (HCC) 03/28/2019   Acute MI, inferior wall, initial episode of care (HCC) 01/31/2011   Morbid obesity (HCC)    CAD (coronary artery disease) 01/29/2011    Current Outpatient Medications on File Prior to Visit  Medication Sig Dispense Refill   albuterol (VENTOLIN HFA) 108 (90 Base) MCG/ACT inhaler Inhale 2 puffs into the lungs every 6 (six) hours as needed for wheezing or shortness of breath.      aspirin 81 MG chewable tablet Chew 1 tablet (81 mg total) by mouth 2 (two) times daily. 90 tablet 0   carvedilol (COREG) 6.25 MG tablet Take 1 tablet (6.25 mg total) by mouth 2 (two) times daily. 180 tablet 3   docusate sodium (COLACE) 100 MG capsule Take 1 capsule (100 mg total) by mouth 2 (two) times daily. 60 capsule 0   furosemide (LASIX) 40 MG tablet Take 1 tablet by mouth once daily 90 tablet 3   gabapentin (NEURONTIN) 300 MG capsule Take 300 mg by mouth 3 (three) times daily.     HYDROcodone-acetaminophen (NORCO/VICODIN) 5-325 MG tablet Take 1 tablet by mouth every 4 (four) hours as needed for moderate pain (pain score 4-6). 30 tablet 0   loratadine (CLARITIN) 10 MG tablet Take 10 mg by mouth daily as needed for allergies.     meloxicam (MOBIC) 15 MG tablet Take 15 mg by mouth daily.     ondansetron (ZOFRAN) 4 MG  tablet Take 1 tablet (4 mg total) by mouth every 6 (six) hours as needed for nausea. 20 tablet 0   rosuvastatin (CRESTOR) 40 MG tablet Take 1 tablet (40 mg total) by mouth daily at 6 PM. 90 tablet 3   sacubitril-valsartan (ENTRESTO) 49-51 MG Take 1 tablet by mouth 2 (two) times daily. 60 tablet 6   senna (SENOKOT) 8.6 MG TABS tablet Take 2 tablets (17.2 mg total) by mouth at bedtime. 60 tablet 0   sertraline (ZOLOFT) 100 MG tablet Take 100 mg by mouth at bedtime.      spironolactone (ALDACTONE) 25 MG tablet Take 12.5 mg by mouth daily.     No current facility-administered medications on file prior to visit.    Allergies  Allergen Reactions   Penicillins Other (See Comments)    Childhood allergy    Review of Systems No fevers, chills, nausea, muscle aches, no difficulty breathing, no calf pain, no chest pain or shortness of breath   Physical Exam  GENERAL APPEARANCE: Alert, conversant. Appropriately groomed. No acute distress.   VASCULAR: Pedal pulses palpable 2/4 DP and 2/4 PT bilateral.  Capillary refill time is immediate to all digits,  Proximal to distal cooling is warm to warm.  Digital perfusion adequate.   NEUROLOGIC: sensation is intact to 5.07 monofilament at 5/5  sites bilateral.  Light touch is intact bilateral, vibratory sensation intact bilateral  MUSCULOSKELETAL: acceptable muscle strength, tone and stability bilateral.  No gross boney pedal deformities noted.  No pain, crepitus or limitation noted with foot and ankle range of motion bilateral.   DERMATOLOGIC: skin is warm, supple, and dry.  Right second toenail is thick and dystrophic. Pain with direct palpation is noted.  Mild discoloration of the third toenail is also noted. No active ingrowing toenail deformity present bilateral.  Incurvated borders noted.    Assessment   Ingrowing toenail - Plan: Fungus Culture with Smear  Onychomycosis    Plan  Discussed treatment options and alternatives.  The patient  would like to treat the toenail fungus at this time painful and symptomatic for him.  Discussed topical versus laser versus oral medication.  He would like to try oral medication.  I did look at his most recent comprehensive metabolic panel which was normal.  I am starting him on Lamisil today for 30 days.  I also did a sample of the nail to confirm his diagnosis.  He will be seen back in 30 days for recheck on the Lamisil.  He will call sooner if any problems arise.

## 2022-08-24 NOTE — Patient Instructions (Signed)
Onychomycosis/Fungal Toenails ? ?WHAT IS IT? An infection that lies within the keratin of your nail plate that is caused by a fungus. A toenail grows from a root (similar to a hair) and in most cases the fungus will grow out within the nail.  This is the reason most at-home remedies do not work since they are not treating the root of the nail.  ? ? ? ? ?   ? ? ? ?WHY ME? Fungal infections affect all ages, sexes, races, and creeds.  There may be many factors that predispose you to a fungal infection such as age, coexisting medical conditions such as diabetes, or an autoimmune disease; stress, medications, fatigue, genetics, etc.   ? ?Bottom line: fungus thrives in a warm, moist environment and your toes and shoes offer such a location. ? ?IS IT CONTAGIOUS? Theoretically, yes.  You do not want to share shoes, nail clippers or files with someone who has fungal toenails.  Walking around barefoot in the same room or sleeping in the same bed is unlikely to transfer the organism.  It is important to realize, however, that fungus can spread easily from one nail to the next on the same foot.  It can also be transferred from person to person if instruments used to trim nails and cuticles aren't properly sterilized between people and use (nail salons that do not properly sterilize instruments) ? ?IS IT ALWAYS A FUNGUS?  No-  trauma to the nail bed can also cause discoloration and thickening of a toenail. We take a sample of the nail and send it for special testing to determine exactly what organisms (if any) are living in the nail.  If an organism is growing, we will know exactly which one and what medication is best used to treat it.  If no organism is found, it doesn't always mean there is no fungus present-  it can mean there is not enough present for it to grow in lab conditions.  We can still consider treatment, however it is more difficult to determine the best medication and treatment options to try.  ? ?HOW DO WE TREAT  THIS?  There are several ways to treat this condition.  Treatment may depend on many factors such as age, medications, pregnancy, liver and kidney conditions, etc.  Make sure to tell your doctor of any liver or kidney problems before starting any oral antifungal treatments ? ? No treatment.   Unlike many other medical concerns, you can live with this condition.  However for many people this can be a painful condition and may lead to ingrown toenails or a bacterial infection.  It is recommended that you keep the nails cut short to help reduce the amount of fungal nail. ? ?2.   Topical treatment. -  Usually the infection must be mild to use a topical medication (ie hasn't caused thickening of the nail plate).  The topical medicine is applied daily to an un painted nail for the duration of a new nail to grow-    usually 6 to 9 months ? ?3 Oral antifungal medications.  With an 80-90% cure rate, the most common oral medication (Lamisil/ Terbinafine) requires 3 to 4 months of therapy and stays in your system for a year as the new nail grows out from root to tip.  Oral antifungal medications do require blood work to make sure it is a safe drug for you.  A liver function panel will be performed prior to starting the medication   and after the first month of treatment.  It is important to have the blood work performed to avoid any harmful side effects. This medication can have harmful side effects and isn't recommended for all patients, especially those on multiple medications or who have a history of liver or kidney disease. ? ?4   Laser therapy-  A good option for a mild fungal infection.  Usually used in combination with a topical and/or oral antifungals. This is an out of pocket service as insurance will not pay for the procedure as it is deemed "cosmetic".  It is also considered for those who are not candidates for oral therapy.  ? ?5.   Permanent Nail Avulsion.  Removing the entire nail so that a new nail will not grow  back. ? ?

## 2022-09-15 ENCOUNTER — Emergency Department (HOSPITAL_COMMUNITY)
Admission: EM | Admit: 2022-09-15 | Discharge: 2022-09-15 | Disposition: A | Discharge status: Home / Self Care | Payer: Medicaid Other | Attending: Emergency Medicine | Admitting: Emergency Medicine

## 2022-09-15 ENCOUNTER — Emergency Department (HOSPITAL_COMMUNITY): Payer: Medicaid Other

## 2022-09-15 ENCOUNTER — Other Ambulatory Visit: Payer: Self-pay

## 2022-09-15 ENCOUNTER — Encounter (HOSPITAL_COMMUNITY): Payer: Self-pay

## 2022-09-15 DIAGNOSIS — Z79899 Other long term (current) drug therapy: Secondary | ICD-10-CM | POA: Diagnosis not present

## 2022-09-15 DIAGNOSIS — S8991XA Unspecified injury of right lower leg, initial encounter: Secondary | ICD-10-CM | POA: Insufficient documentation

## 2022-09-15 DIAGNOSIS — J45909 Unspecified asthma, uncomplicated: Secondary | ICD-10-CM | POA: Insufficient documentation

## 2022-09-15 DIAGNOSIS — I509 Heart failure, unspecified: Secondary | ICD-10-CM | POA: Insufficient documentation

## 2022-09-15 DIAGNOSIS — I119 Hypertensive heart disease without heart failure: Secondary | ICD-10-CM | POA: Insufficient documentation

## 2022-09-15 DIAGNOSIS — X501XXA Overexertion from prolonged static or awkward postures, initial encounter: Secondary | ICD-10-CM | POA: Diagnosis not present

## 2022-09-15 DIAGNOSIS — Z7982 Long term (current) use of aspirin: Secondary | ICD-10-CM | POA: Diagnosis not present

## 2022-09-15 DIAGNOSIS — M25561 Pain in right knee: Secondary | ICD-10-CM | POA: Diagnosis present

## 2022-09-15 MED ORDER — NAPROXEN 375 MG PO TABS: 375.0000 mg | ORAL_TABLET | Freq: Two times a day (BID) | ORAL | 0 refills | Status: DC

## 2022-09-15 MED ORDER — NAPROXEN 250 MG PO TABS
500.0000 mg | ORAL_TABLET | Freq: Once | ORAL | Status: AC
  Administered 2022-09-15: 500 mg via ORAL
  Filled 2022-09-15: qty 2

## 2022-09-15 NOTE — Progress Notes (Signed)
Orthopedic Tech Progress Note Patient Details:  Frank Flowers Apr 17, 1960 [id number]  Ortho Devices Type of Ortho Device: Crutches Ortho Device/Splint Interventions: Ordered, Adjustment   Post Interventions Patient Tolerated: Well, Ambulated well Instructions Provided: Care of device  Tonye Pearson 09/15/2022, 3:58 PM

## 2022-09-15 NOTE — ED Provider Triage Note (Signed)
Emergency Medicine Provider Triage Evaluation Note  Frank Flowers , a 62 y.o. male  was evaluated in triage.  Pt complains of right knee pain.  Review of Systems  Positive:  Negative:   Physical Exam  BP (!) 156/106 (BP Location: Left Arm)   Pulse 86   Temp 98.1 F (36.7 C)   Resp 18   Ht 6\' 2"  (1.88 m)   Wt 136.1 kg   SpO2 97%   BMI 38.52 kg/m  Gen:   Awake, no distress   Resp:  Normal effort  MSK:   Moves extremities without difficulty  Other:    Medical Decision Making  Medically screening exam initiated at 1:39 PM.  Appropriate orders placed.  [name] was informed that the remainder of the evaluation will be completed by another provider, this initial triage assessment does not replace that evaluation, and the importance of remaining in the ED until their evaluation is complete.  Concerned for right knee pain. Patient fell 2 days and that's when symptoms started. Patient stating that it hurts too  much to walk on.  Denies fever, chest pain, dyspnea, cough, nausea, vomiting, diarrhea.    Dorthy Cooler, [place] 09/15/22 1341

## 2022-09-15 NOTE — Discharge Instructions (Addendum)
Follow-up with outpatient Ortho on scheduled appointment this Friday regarding symptoms and management  Following one tablet in ER start taking Naproxen every 6-8 hours as needed for pain

## 2022-09-15 NOTE — ED Triage Notes (Signed)
Pt came in via POV d/t Rt leg pain & swelling for the past few days & reports he cannot put pressure on it now without extreme pain. A/Ox4, rates pain 10/10 when he tries to stand.
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
## 2022-09-28 ENCOUNTER — Ambulatory Visit: Payer: Medicaid Other | Admitting: Podiatry

## 2022-11-25 DIAGNOSIS — I10 Essential (primary) hypertension: Secondary | ICD-10-CM | POA: Diagnosis not present

## 2022-11-25 DIAGNOSIS — E7800 Pure hypercholesterolemia, unspecified: Secondary | ICD-10-CM | POA: Diagnosis not present

## 2022-11-25 DIAGNOSIS — G894 Chronic pain syndrome: Secondary | ICD-10-CM | POA: Diagnosis not present

## 2022-11-25 DIAGNOSIS — F17200 Nicotine dependence, unspecified, uncomplicated: Secondary | ICD-10-CM | POA: Diagnosis not present

## 2022-11-25 DIAGNOSIS — M25561 Pain in right knee: Secondary | ICD-10-CM | POA: Diagnosis not present

## 2022-11-29 DIAGNOSIS — F1410 Cocaine abuse, uncomplicated: Secondary | ICD-10-CM | POA: Diagnosis not present

## 2022-12-06 DIAGNOSIS — F1410 Cocaine abuse, uncomplicated: Secondary | ICD-10-CM | POA: Diagnosis not present

## 2022-12-13 DIAGNOSIS — F1410 Cocaine abuse, uncomplicated: Secondary | ICD-10-CM | POA: Diagnosis not present

## 2022-12-15 DIAGNOSIS — F1410 Cocaine abuse, uncomplicated: Secondary | ICD-10-CM | POA: Diagnosis not present

## 2022-12-22 DIAGNOSIS — F1410 Cocaine abuse, uncomplicated: Secondary | ICD-10-CM | POA: Diagnosis not present

## 2022-12-24 DIAGNOSIS — F1410 Cocaine abuse, uncomplicated: Secondary | ICD-10-CM | POA: Diagnosis not present

## 2022-12-30 ENCOUNTER — Encounter (HOSPITAL_COMMUNITY): Payer: Self-pay

## 2022-12-30 ENCOUNTER — Emergency Department (HOSPITAL_COMMUNITY)
Admission: EM | Admit: 2022-12-30 | Discharge: 2022-12-31 | Disposition: A | Discharge status: Home / Self Care | Payer: 59 | Attending: Emergency Medicine | Admitting: Emergency Medicine

## 2022-12-30 ENCOUNTER — Emergency Department (HOSPITAL_COMMUNITY): Payer: 59

## 2022-12-30 ENCOUNTER — Other Ambulatory Visit: Payer: Self-pay

## 2022-12-30 DIAGNOSIS — Z7982 Long term (current) use of aspirin: Secondary | ICD-10-CM | POA: Diagnosis not present

## 2022-12-30 DIAGNOSIS — M25561 Pain in right knee: Secondary | ICD-10-CM | POA: Diagnosis not present

## 2022-12-30 DIAGNOSIS — R0989 Other specified symptoms and signs involving the circulatory and respiratory systems: Secondary | ICD-10-CM | POA: Diagnosis not present

## 2022-12-30 DIAGNOSIS — I5023 Acute on chronic systolic (congestive) heart failure: Secondary | ICD-10-CM | POA: Insufficient documentation

## 2022-12-30 DIAGNOSIS — Z79899 Other long term (current) drug therapy: Secondary | ICD-10-CM | POA: Diagnosis not present

## 2022-12-30 DIAGNOSIS — I509 Heart failure, unspecified: Secondary | ICD-10-CM | POA: Diagnosis not present

## 2022-12-30 DIAGNOSIS — R0602 Shortness of breath: Secondary | ICD-10-CM | POA: Diagnosis not present

## 2022-12-30 DIAGNOSIS — I10 Essential (primary) hypertension: Secondary | ICD-10-CM | POA: Diagnosis not present

## 2022-12-30 DIAGNOSIS — J811 Chronic pulmonary edema: Secondary | ICD-10-CM | POA: Diagnosis not present

## 2022-12-30 DIAGNOSIS — I110 Hypertensive heart disease with heart failure: Secondary | ICD-10-CM | POA: Insufficient documentation

## 2022-12-30 DIAGNOSIS — I252 Old myocardial infarction: Secondary | ICD-10-CM | POA: Diagnosis not present

## 2022-12-30 DIAGNOSIS — I2510 Atherosclerotic heart disease of native coronary artery without angina pectoris: Secondary | ICD-10-CM | POA: Insufficient documentation

## 2022-12-30 LAB — CBC WITH DIFFERENTIAL/PLATELET
Abs Immature Granulocytes: 0.03 10*3/uL (ref 0.00–0.07)
Basophils Absolute: 0.1 10*3/uL (ref 0.0–0.1)
Basophils Relative: 1 %
Eosinophils Absolute: 0.1 10*3/uL (ref 0.0–0.5)
Eosinophils Relative: 2 %
HCT: 48.8 % (ref 39.0–52.0)
Hemoglobin: 15.9 g/dL (ref 13.0–17.0)
Immature Granulocytes: 0 %
Lymphocytes Relative: 40 %
Lymphs Abs: 2.9 10*3/uL (ref 0.7–4.0)
MCH: 29.7 pg (ref 26.0–34.0)
MCHC: 32.6 g/dL (ref 30.0–36.0)
MCV: 91 fL (ref 80.0–100.0)
Monocytes Absolute: 0.7 10*3/uL (ref 0.1–1.0)
Monocytes Relative: 10 %
Neutro Abs: 3.3 10*3/uL (ref 1.7–7.7)
Neutrophils Relative %: 47 %
Platelets: 214 10*3/uL (ref 150–400)
RBC: 5.36 MIL/uL (ref 4.22–5.81)
RDW: 15.9 % — ABNORMAL HIGH (ref 11.5–15.5)
WBC: 7.1 10*3/uL (ref 4.0–10.5)
nRBC: 0 % (ref 0.0–0.2)

## 2022-12-30 LAB — BASIC METABOLIC PANEL
Anion gap: 8 (ref 5–15)
BUN: 15 mg/dL (ref 8–23)
CO2: 28 mmol/L (ref 22–32)
Calcium: 8.9 mg/dL (ref 8.9–10.3)
Chloride: 103 mmol/L (ref 98–111)
Creatinine, Ser: 1.05 mg/dL (ref 0.61–1.24)
GFR, Estimated: >60 mL/min (ref >60)
Glucose, Bld: 80 mg/dL (ref 70–99)
Potassium: 3.6 mmol/L (ref 3.5–5.1)
Sodium: 139 mmol/L (ref 135–145)

## 2022-12-30 LAB — RESP PANEL BY RT-PCR (RSV, FLU A&B, COVID)  RVPGX2
Influenza A by PCR: NEGATIVE
Influenza B by PCR: NEGATIVE
Resp Syncytial Virus by PCR: NEGATIVE
SARS Coronavirus 2 by RT PCR: NEGATIVE

## 2022-12-30 LAB — BRAIN NATRIURETIC PEPTIDE: B Natriuretic Peptide: 212.7 pg/mL — ABNORMAL HIGH (ref 0.0–100.0)

## 2022-12-30 LAB — TROPONIN I (HIGH SENSITIVITY): Troponin I (High Sensitivity): 26 ng/L — ABNORMAL HIGH (ref <18)

## 2022-12-30 NOTE — ED Provider Triage Note (Signed)
Emergency Medicine Provider Triage Evaluation Note  Frank Flowers , a 62 y.o. male  was evaluated in triage.  Pt complains of sob. Worsening last few days. Associated PND, orthopnea. No LE swelling. Cough with white mucous. No fever. Compliant with meds at home (lasix, Plavix)  Review of Systems  Positive: Sob, chest tightness Negative: Fever, le swelling  Physical Exam  BP (!) 127/96   Pulse 80   Resp 20   Ht 6\' 2"  (1.88 m)   Wt 134.7 kg   SpO2 98%   BMI 38.13 kg/m  Gen:   Awake, no distress   Resp:  Normal effort, course lung sounds BIL, Right >L MSK:   Moves extremities without difficulty  Other:    Medical Decision Making  Medically screening exam initiated at 5:06 PM.  Appropriate orders placed.  Frank Flowers was informed that the remainder of the evaluation will be completed by another provider, this initial triage assessment does not replace that evaluation, and the importance of remaining in the ED until their evaluation is complete.  SOB   Frank Flowers A, PA-C 12/30/22 1708

## 2022-12-30 NOTE — ED Triage Notes (Addendum)
Patient said he was diagnosed with heart failure. Feels short of breath and his chest feels tight. Short of breath with ambulation and when he lays down.

## 2022-12-31 DIAGNOSIS — I110 Hypertensive heart disease with heart failure: Secondary | ICD-10-CM | POA: Diagnosis not present

## 2022-12-31 LAB — TROPONIN I (HIGH SENSITIVITY): Troponin I (High Sensitivity): 23 ng/L — ABNORMAL HIGH (ref <18)

## 2022-12-31 MED ORDER — FUROSEMIDE 40 MG PO TABS: 40.0000 mg | ORAL_TABLET | Freq: Two times a day (BID) | ORAL | 0 refills | Status: DC

## 2022-12-31 MED ORDER — FUROSEMIDE 10 MG/ML IJ SOLN
40.0000 mg | Freq: Once | INTRAMUSCULAR | Status: AC
  Administered 2022-12-31: 40 mg via INTRAVENOUS
  Filled 2022-12-31: qty 4

## 2022-12-31 NOTE — Discharge Instructions (Signed)
You were seen today with shortness of breath.  This is likely related to mild heart failure exacerbation.  Make sure you are managing your fluid intake and maintaining a low-salt diet.  Increase your Lasix to 40 mg twice daily for the next 3 days.  Follow-up closely with your cardiologist.  If you have any new or worsening symptoms, you should be reevaluated.
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
## 2023-01-10 DIAGNOSIS — F4323 Adjustment disorder with mixed anxiety and depressed mood: Secondary | ICD-10-CM | POA: Diagnosis not present

## 2023-02-24 ENCOUNTER — Emergency Department (HOSPITAL_COMMUNITY)
Admission: EM | Admit: 2023-02-24 | Discharge: 2023-02-25 | Disposition: A | Discharge status: Home / Self Care | Payer: 59 | Attending: Emergency Medicine | Admitting: Emergency Medicine

## 2023-02-24 ENCOUNTER — Encounter (HOSPITAL_COMMUNITY): Payer: Self-pay

## 2023-02-24 ENCOUNTER — Emergency Department (HOSPITAL_COMMUNITY): Payer: 59

## 2023-02-24 ENCOUNTER — Other Ambulatory Visit: Payer: Self-pay

## 2023-02-24 DIAGNOSIS — I2510 Atherosclerotic heart disease of native coronary artery without angina pectoris: Secondary | ICD-10-CM | POA: Insufficient documentation

## 2023-02-24 DIAGNOSIS — R600 Localized edema: Secondary | ICD-10-CM | POA: Diagnosis not present

## 2023-02-24 DIAGNOSIS — Z87891 Personal history of nicotine dependence: Secondary | ICD-10-CM | POA: Insufficient documentation

## 2023-02-24 DIAGNOSIS — I517 Cardiomegaly: Secondary | ICD-10-CM | POA: Insufficient documentation

## 2023-02-24 DIAGNOSIS — R0602 Shortness of breath: Secondary | ICD-10-CM | POA: Diagnosis present

## 2023-02-24 DIAGNOSIS — J101 Influenza due to other identified influenza virus with other respiratory manifestations: Secondary | ICD-10-CM | POA: Diagnosis not present

## 2023-02-24 DIAGNOSIS — R7989 Other specified abnormal findings of blood chemistry: Secondary | ICD-10-CM | POA: Diagnosis not present

## 2023-02-24 DIAGNOSIS — Z79899 Other long term (current) drug therapy: Secondary | ICD-10-CM | POA: Insufficient documentation

## 2023-02-24 DIAGNOSIS — J441 Chronic obstructive pulmonary disease with (acute) exacerbation: Secondary | ICD-10-CM | POA: Diagnosis not present

## 2023-02-24 DIAGNOSIS — Z7951 Long term (current) use of inhaled steroids: Secondary | ICD-10-CM | POA: Diagnosis not present

## 2023-02-24 DIAGNOSIS — Z7952 Long term (current) use of systemic steroids: Secondary | ICD-10-CM | POA: Insufficient documentation

## 2023-02-24 DIAGNOSIS — I509 Heart failure, unspecified: Secondary | ICD-10-CM | POA: Insufficient documentation

## 2023-02-24 DIAGNOSIS — I110 Hypertensive heart disease with heart failure: Secondary | ICD-10-CM | POA: Insufficient documentation

## 2023-02-24 DIAGNOSIS — Z7982 Long term (current) use of aspirin: Secondary | ICD-10-CM | POA: Diagnosis not present

## 2023-02-24 LAB — BASIC METABOLIC PANEL
Anion gap: 9 (ref 5–15)
BUN: 20 mg/dL (ref 8–23)
CO2: 24 mmol/L (ref 22–32)
Calcium: 8.8 mg/dL — ABNORMAL LOW (ref 8.9–10.3)
Chloride: 104 mmol/L (ref 98–111)
Creatinine, Ser: 1.1 mg/dL (ref 0.61–1.24)
GFR, Estimated: >60 mL/min (ref >60)
Glucose, Bld: 99 mg/dL (ref 70–99)
Potassium: 3.7 mmol/L (ref 3.5–5.1)
Sodium: 137 mmol/L (ref 135–145)

## 2023-02-24 LAB — CBC WITH DIFFERENTIAL/PLATELET
Abs Immature Granulocytes: 0.03 10*3/uL (ref 0.00–0.07)
Basophils Absolute: 0 10*3/uL (ref 0.0–0.1)
Basophils Relative: 1 %
Eosinophils Absolute: 0.1 10*3/uL (ref 0.0–0.5)
Eosinophils Relative: 1 %
HCT: 48.9 % (ref 39.0–52.0)
Hemoglobin: 16 g/dL (ref 13.0–17.0)
Immature Granulocytes: 0 %
Lymphocytes Relative: 9 %
Lymphs Abs: 0.6 10*3/uL — ABNORMAL LOW (ref 0.7–4.0)
MCH: 29.9 pg (ref 26.0–34.0)
MCHC: 32.7 g/dL (ref 30.0–36.0)
MCV: 91.4 fL (ref 80.0–100.0)
Monocytes Absolute: 1.1 10*3/uL — ABNORMAL HIGH (ref 0.1–1.0)
Monocytes Relative: 16 %
Neutro Abs: 4.9 10*3/uL (ref 1.7–7.7)
Neutrophils Relative %: 73 %
Platelets: 185 10*3/uL (ref 150–400)
RBC: 5.35 MIL/uL (ref 4.22–5.81)
RDW: 15.4 % (ref 11.5–15.5)
WBC: 6.7 10*3/uL (ref 4.0–10.5)
nRBC: 0 % (ref 0.0–0.2)

## 2023-02-24 LAB — RESP PANEL BY RT-PCR (RSV, FLU A&B, COVID)  RVPGX2
Influenza A by PCR: POSITIVE — AB
Influenza B by PCR: NEGATIVE
Resp Syncytial Virus by PCR: NEGATIVE
SARS Coronavirus 2 by RT PCR: NEGATIVE

## 2023-02-24 MED ORDER — FUROSEMIDE 10 MG/ML IJ SOLN
40.0000 mg | Freq: Once | INTRAMUSCULAR | Status: AC
  Administered 2023-02-24: 40 mg via INTRAVENOUS
  Filled 2023-02-24: qty 4

## 2023-02-24 MED ORDER — IPRATROPIUM-ALBUTEROL 0.5-2.5 (3) MG/3ML IN SOLN
3.0000 mL | Freq: Once | RESPIRATORY_TRACT | Status: AC
  Administered 2023-02-24: 3 mL via RESPIRATORY_TRACT
  Filled 2023-02-24: qty 3

## 2023-02-24 MED ORDER — METHYLPREDNISOLONE SODIUM SUCC 125 MG IJ SOLR
125.0000 mg | Freq: Once | INTRAMUSCULAR | Status: AC
  Administered 2023-02-24: 125 mg via INTRAVENOUS
  Filled 2023-02-24: qty 2

## 2023-02-24 NOTE — ED Provider Notes (Incomplete)
[place] EMERGENCY DEPARTMENT AT [facility] Provider Note   CSN: [id number] Arrival date & time: 02/24/23  2039     History {Add pertinent medical, surgical, social history, OB history to HPI:1} Chief Complaint  Patient presents with   Shortness of Breath    Frank Flowers is a 63 y.o. male, hx of HTN, CHF, CAD, who presents to the ED 2/2 to shortness of breath, this been going on for the last week.  Has had worsening shortness of breath for the last week, with dry cough.  Denies any runny nose sore throat, fever, chills.  Also reports that he has had some swelling of his bilateral lower extremities, but denies any swelling of his abdomen.  Has stopped smoking the last couple days, because the of his shortness of breath.  Previously smoked about 1 pack/day.  Also reports wheezing.  Home Medications Prior to Admission medications   Medication Sig Start Date End Date Taking? Authorizing Provider  albuterol (VENTOLIN HFA) 108 (90 Base) MCG/ACT inhaler Inhale 2 puffs into the lungs every 6 (six) hours as needed for wheezing or shortness of breath.  02/23/19   [provider]  aspirin 81 MG chewable tablet Chew 1 tablet (81 mg total) by mouth 2 (two) times daily. 09/18/20   Swinteck, Arlys Jorge, MD  carvedilol (COREG) 6.25 MG tablet Take 1 tablet (6.25 mg total) by mouth 2 (two) times daily. 11/06/19   Lewayne Bunting, MD  docusate sodium (COLACE) 100 MG capsule Take 1 capsule (100 mg total) by mouth 2 (two) times daily. 09/18/20   Swinteck, Arlys Rubert, MD  furosemide (LASIX) 40 MG tablet Take 1 tablet by mouth once daily 06/16/20   Lewayne Bunting, MD  furosemide (LASIX) 40 MG tablet Take 1 tablet (40 mg total) by mouth 2 (two) times daily. 12/31/22   Horton, Mayer Masker, MD  gabapentin (NEURONTIN) 300 MG capsule Take 300 mg by mouth 3 (three) times daily. 02/23/19   [provider]  HYDROcodone-acetaminophen (NORCO/VICODIN) 5-325 MG tablet Take 1 tablet by mouth every 4 (four)  hours as needed for moderate pain (pain score 4-6). 09/18/20   Swinteck, Arlys Geovany, MD  loratadine (CLARITIN) 10 MG tablet Take 10 mg by mouth daily as needed for allergies.    [provider]  meloxicam (MOBIC) 15 MG tablet Take 15 mg by mouth daily. 02/23/19   [provider]  naproxen (NAPROSYN) 375 MG tablet Take 1 tablet (375 mg total) by mouth 2 (two) times daily. 09/15/22   Judithann Sheen, PA  ondansetron (ZOFRAN) 4 MG tablet Take 1 tablet (4 mg total) by mouth every 6 (six) hours as needed for nausea. 09/18/20   Swinteck, Arlys Qunicy, MD  rosuvastatin (CRESTOR) 40 MG tablet Take 1 tablet (40 mg total) by mouth daily at 6 PM. 06/08/19   Crenshaw, Madolyn Frieze, MD  sacubitril-valsartan (ENTRESTO) 49-51 MG Take 1 tablet by mouth 2 (two) times daily. 04/18/20   Lewayne Bunting, MD  senna (SENOKOT) 8.6 MG TABS tablet Take 2 tablets (17.2 mg total) by mouth at bedtime. 09/18/20   Swinteck, Arlys Mccade, MD  sertraline (ZOLOFT) 100 MG tablet Take 100 mg by mouth at bedtime.  07/25/19   [provider]  spironolactone (ALDACTONE) 25 MG tablet Take 12.5 mg by mouth daily. 03/24/20   [provider]  terbinafine (LAMISIL) 250 MG tablet Take 1 tablet (250 mg total) by mouth daily. 08/24/22   Delories Heinz, DPM      Allergies  Penicillins    Review of Systems   Review of Systems  Constitutional:  Negative for fever.  Respiratory:  Positive for shortness of breath and wheezing.     Physical Exam Updated Vital Signs BP 110/78   Pulse 100   Temp 98.3 F (36.8 C)   Resp (!) 22   Ht 6\' 2"  (1.88 m)   Wt 134 kg   SpO2 99%   BMI 37.93 kg/m  Physical Exam Vitals and nursing note reviewed.  Constitutional:      Appearance: Normal appearance.  HENT:     Head: Normocephalic and atraumatic.     Right Ear: Tympanic membrane normal.     Left Ear: Tympanic membrane normal.     Nose: Nose normal.     Mouth/Throat:     Mouth: Mucous membranes are moist.  Eyes:     Extraocular  Movements: Extraocular movements intact.     Conjunctiva/sclera: Conjunctivae normal.     Pupils: Pupils are equal, round, and reactive to light.  Cardiovascular:     Rate and Rhythm: Normal rate and regular rhythm.  Pulmonary:     Effort: Pulmonary effort is normal.     Breath sounds: Wheezing present.  Abdominal:     General: Abdomen is flat. Bowel sounds are normal.     Palpations: Abdomen is soft.  Musculoskeletal:        General: Normal range of motion.     Cervical back: Normal range of motion and neck supple.     Right lower leg: 1+ Pitting Edema present.     Left lower leg: 1+ Pitting Edema present.  Skin:    General: Skin is warm and dry.     Capillary Refill: Capillary refill takes less than 2 seconds.  Neurological:     General: No focal deficit present.     Mental Status: He is alert.  Psychiatric:        Mood and Affect: Mood normal.        Thought Content: Thought content normal.     ED Results / Procedures / Treatments   Labs (all labs ordered are listed, but only abnormal results are displayed) Labs Reviewed  CBC WITH DIFFERENTIAL/PLATELET - Abnormal; Notable for the following components:      Result Value   Lymphs Abs 0.6 (*)    Monocytes Absolute 1.1 (*)    All other components within normal limits  RESP PANEL BY RT-PCR (RSV, FLU A&B, COVID)  RVPGX2  BRAIN NATRIURETIC PEPTIDE  BASIC METABOLIC PANEL    EKG EKG Interpretation Date/Time:  Thursday February 24 2023 22:19:50 EST Ventricular Rate:  96 PR Interval:  186 QRS Duration:  99 QT Interval:  345 QTC Calculation: 436 R Axis:   22  Text Interpretation: Sinus rhythm Probable left atrial enlargement LVH with secondary repolarization abnormality since last tracing no significant change Confirmed by Rolan Bucco [phone]) on 02/24/2023 10:50:23 PM  Radiology DG Chest 2 View Result Date: 02/24/2023 CLINICAL DATA:  Shortness of breath EXAM: CHEST - 2 VIEW COMPARISON:  12/30/2022 FINDINGS: No focal  opacity, pleural effusion or pneumothorax. Mild cardiomegaly with mild diffuse interstitial opacity probably low-grade edema. IMPRESSION: Mild cardiomegaly with mild diffuse interstitial opacity probably due to mild edema Electronically Signed   By: Jasmine Pang M.D.   On: 02/24/2023 22:47    Procedures Procedures  {Document cardiac monitor, telemetry assessment procedure when appropriate:1}  Medications Ordered in ED Medications - No data to display  ED Course/ Medical Decision  Making/ A&P   {   Click here for ABCD2, HEART and other calculatorsREFRESH Note before signing :1}                              Medical Decision Making Risk Prescription drug management.   ***  {Document critical care time when appropriate:1} {Document review of labs and clinical decision tools ie heart score, Chads2Vasc2 etc:1}  {Document your independent review of radiology images, and any outside records:1} {Document your discussion with family members, caretakers, and with consultants:1} {Document social determinants of health affecting pt's care:1} {Document your decision making why or why not admission, treatments were needed:1} Final Clinical Impression(s) / ED Diagnoses Final diagnoses:  None    Rx / DC Orders ED Discharge Orders     None

## 2023-02-24 NOTE — ED Triage Notes (Signed)
Pt states he went to urgent care for flu like symptoms & they sent him here d/t SOB. Pt reports headache. Denies any other S/S at time of triage.

## 2023-02-25 DIAGNOSIS — J101 Influenza due to other identified influenza virus with other respiratory manifestations: Secondary | ICD-10-CM | POA: Diagnosis not present

## 2023-02-25 LAB — BRAIN NATRIURETIC PEPTIDE: B Natriuretic Peptide: 102.4 pg/mL — ABNORMAL HIGH (ref 0.0–100.0)

## 2023-02-25 MED ORDER — ALBUTEROL SULFATE HFA 108 (90 BASE) MCG/ACT IN AERS: 1.0000 | INHALATION_SPRAY | Freq: Four times a day (QID) | RESPIRATORY_TRACT | Status: DC | PRN

## 2023-02-25 MED ORDER — ALBUTEROL SULFATE (2.5 MG/3ML) 0.083% IN NEBU
2.5000 mg | INHALATION_SOLUTION | Freq: Once | RESPIRATORY_TRACT | Status: AC
  Administered 2023-02-25: 2.5 mg via RESPIRATORY_TRACT
  Filled 2023-02-25: qty 3

## 2023-02-25 MED ORDER — IPRATROPIUM-ALBUTEROL 0.5-2.5 (3) MG/3ML IN SOLN: 3.0000 mL | Freq: Four times a day (QID) | RESPIRATORY_TRACT | 0 refills | Status: DC | PRN

## 2023-02-25 MED ORDER — GUAIFENESIN ER 600 MG PO TB12: 600.0000 mg | ORAL_TABLET | Freq: Two times a day (BID) | ORAL | 0 refills | Status: DC

## 2023-02-25 MED ORDER — PREDNISONE 10 MG (21) PO TBPK: ORAL_TABLET | Freq: Every day | ORAL | 0 refills | Status: DC

## 2023-02-25 NOTE — Discharge Instructions (Addendum)
You have the flu, this is likely causing your COPD to flareup.  I prescribed you some albuterol inhalers, and we are working on getting you a nebulizing machine for home.  Please take the steroids as instructed, and return to the ER if you have worsening symptoms.  Make sure you are taking Tylenol, and ibuprofen, for your body aches, and make sure you are taking your Lasix twice a day as instructed.  If you have worsening symptoms please return to the ER

## 2023-04-11 ENCOUNTER — Other Ambulatory Visit: Payer: Self-pay

## 2023-04-11 ENCOUNTER — Emergency Department (HOSPITAL_COMMUNITY)
Admission: EM | Admit: 2023-04-11 | Discharge: 2023-04-11 | Disposition: A | Discharge status: Home / Self Care | Attending: Emergency Medicine | Admitting: Emergency Medicine

## 2023-04-11 ENCOUNTER — Emergency Department (HOSPITAL_COMMUNITY)

## 2023-04-11 ENCOUNTER — Encounter (HOSPITAL_COMMUNITY): Payer: Self-pay

## 2023-04-11 DIAGNOSIS — I5021 Acute systolic (congestive) heart failure: Secondary | ICD-10-CM | POA: Insufficient documentation

## 2023-04-11 DIAGNOSIS — R0602 Shortness of breath: Secondary | ICD-10-CM

## 2023-04-11 LAB — BASIC METABOLIC PANEL WITH GFR
Anion gap: 9 (ref 5–15)
BUN: 18 mg/dL (ref 8–23)
CO2: 24 mmol/L (ref 22–32)
Calcium: 8.8 mg/dL — ABNORMAL LOW (ref 8.9–10.3)
Chloride: 106 mmol/L (ref 98–111)
Creatinine, Ser: 1.09 mg/dL (ref 0.61–1.24)
GFR, Estimated: >60 mL/min (ref >60)
Glucose, Bld: 117 mg/dL — ABNORMAL HIGH (ref 70–99)
Potassium: 3.5 mmol/L (ref 3.5–5.1)
Sodium: 139 mmol/L (ref 135–145)

## 2023-04-11 LAB — CBC
HCT: 51.3 % (ref 39.0–52.0)
Hemoglobin: 15.9 g/dL (ref 13.0–17.0)
MCH: 28.7 pg (ref 26.0–34.0)
MCHC: 31 g/dL (ref 30.0–36.0)
MCV: 92.6 fL (ref 80.0–100.0)
Platelets: 211 10*3/uL (ref 150–400)
RBC: 5.54 MIL/uL (ref 4.22–5.81)
RDW: 16.6 % — ABNORMAL HIGH (ref 11.5–15.5)
WBC: 6.4 10*3/uL (ref 4.0–10.5)
nRBC: 0 % (ref 0.0–0.2)

## 2023-04-11 LAB — BRAIN NATRIURETIC PEPTIDE: B Natriuretic Peptide: 272.2 pg/mL — ABNORMAL HIGH (ref 0.0–100.0)

## 2023-04-11 NOTE — Discharge Instructions (Signed)
 Follow up with heart doctor and primary care as discussed.  I have sent a referral to your heart doctor to schedule follow-up.  At this time, I think it would be reasonable to repeat heart ultrasound.  Make sure you are taking your Lasix daily.  Please return to emergency room if you are having shortness of breath or chest pain or new or worsening symptoms.

## 2023-04-11 NOTE — ED Triage Notes (Signed)
 Patient said he has congestive heart failure and the fluid level went from 40 to over 300. Is taking lasix. Feels short of breath when he ambulates. Has left hip pain.

## 2023-04-11 NOTE — ED Notes (Signed)
 Pt walked around unit- maintained SPO2 sats at 99%

## 2023-04-11 NOTE — ED Provider Notes (Signed)
 [place] EMERGENCY DEPARTMENT AT [facility] Provider Note   CSN: [id number] Arrival date & time: 04/11/23  1244     History  Chief Complaint  Patient presents with   Shortness of Breath    Frank Flowers is a 63 y.o. male with past medical history of coronary artery disease, COPD, acute MI, congestive heart failure, hypertension reporting to emergency room with complaint of shortness of breath.  Patient reports he has been dealing with some shortness of breath over the last 1 to 2 weeks and it has been gradually improving. Reports he is actually feeling very well today and dose not really want to be in ER, but PCP said he should come.  He had labs drawn with primary care and his BNP was in the 300s.  Patient reports shortness of breath is worse when he is lying down flat.  Has not noted any exertional chest pain or exertional shortness of breath. Reports he had cough and it was productive, but that is getting better. Denies chest pain, fevers, chills, productive cough. BLE edema, abdominal pain, NVD.  Shortness of Breath      Home Medications Prior to Admission medications   Medication Sig Start Date End Date Taking? Authorizing Provider  albuterol (VENTOLIN HFA) 108 (90 Base) MCG/ACT inhaler Inhale 2 puffs into the lungs every 6 (six) hours as needed for wheezing or shortness of breath.  02/23/19   [provider]  aspirin 81 MG chewable tablet Chew 1 tablet (81 mg total) by mouth 2 (two) times daily. 09/18/20   Swinteck, Arlys Revin, MD  carvedilol (COREG) 6.25 MG tablet Take 1 tablet (6.25 mg total) by mouth 2 (two) times daily. 11/06/19   Lewayne Bunting, MD  docusate sodium (COLACE) 100 MG capsule Take 1 capsule (100 mg total) by mouth 2 (two) times daily. 09/18/20   Swinteck, Arlys Mohamedamin, MD  furosemide (LASIX) 40 MG tablet Take 1 tablet by mouth once daily 06/16/20   Lewayne Bunting, MD  furosemide (LASIX) 40 MG tablet Take 1 tablet (40 mg total) by mouth 2 (two) times  daily. 12/31/22   Horton, Mayer Masker, MD  gabapentin (NEURONTIN) 300 MG capsule Take 300 mg by mouth 3 (three) times daily. 02/23/19   [provider]  guaiFENesin (MUCINEX) 600 MG 12 hr tablet Take 1 tablet (600 mg total) by mouth 2 (two) times daily. 02/25/23   Small, Brooke L, PA  HYDROcodone-acetaminophen (NORCO/VICODIN) 5-325 MG tablet Take 1 tablet by mouth every 4 (four) hours as needed for moderate pain (pain score 4-6). 09/18/20   Swinteck, Arlys Riyan, MD  ipratropium-albuterol (DUONEB) 0.5-2.5 (3) MG/3ML SOLN Take 3 mLs by nebulization every 6 (six) hours as needed (shortness of breath, wheezing). 02/25/23   Small, Brooke L, PA  loratadine (CLARITIN) 10 MG tablet Take 10 mg by mouth daily as needed for allergies.    [provider]  meloxicam (MOBIC) 15 MG tablet Take 15 mg by mouth daily. 02/23/19   [provider]  naproxen (NAPROSYN) 375 MG tablet Take 1 tablet (375 mg total) by mouth 2 (two) times daily. 09/15/22   Judithann Sheen, PA  ondansetron (ZOFRAN) 4 MG tablet Take 1 tablet (4 mg total) by mouth every 6 (six) hours as needed for nausea. 09/18/20   Swinteck, Arlys Jalyn, MD  predniSONE (STERAPRED UNI-PAK 21 TAB) 10 MG (21) TBPK tablet Take by mouth daily. Take 6 tabs by mouth daily  for 2 days, then 5 tabs for 2 days, then  4 tabs for 2 days, then 3 tabs for 2 days, 2 tabs for 2 days, then 1 tab by mouth daily for 2 days 02/25/23   Small, Brooke L, PA  rosuvastatin (CRESTOR) 40 MG tablet Take 1 tablet (40 mg total) by mouth daily at 6 PM. 06/08/19   Crenshaw, Madolyn Frieze, MD  sacubitril-valsartan (ENTRESTO) 49-51 MG Take 1 tablet by mouth 2 (two) times daily. 04/18/20   Lewayne Bunting, MD  senna (SENOKOT) 8.6 MG TABS tablet Take 2 tablets (17.2 mg total) by mouth at bedtime. 09/18/20   Swinteck, Arlys Erven, MD  sertraline (ZOLOFT) 100 MG tablet Take 100 mg by mouth at bedtime.  07/25/19   [provider]  spironolactone (ALDACTONE) 25 MG tablet Take 12.5 mg by mouth daily. 03/24/20    [provider]  terbinafine (LAMISIL) 250 MG tablet Take 1 tablet (250 mg total) by mouth daily. 08/24/22   Delories Heinz, DPM      Allergies    Penicillins    Review of Systems   Review of Systems  Respiratory:  Positive for shortness of breath.     Physical Exam Updated Vital Signs BP 136/85   Pulse 86   Temp 98 F (36.7 C) (Oral)   Resp 20   Ht 6\' 2"  (1.88 m)   Wt 131.5 kg   SpO2 99%   BMI 37.23 kg/m  Physical Exam Vitals and nursing note reviewed.  Constitutional:      General: He is not in acute distress.    Appearance: He is not toxic-appearing.  HENT:     Head: Normocephalic and atraumatic.  Eyes:     General: No scleral icterus.    Conjunctiva/sclera: Conjunctivae normal.  Cardiovascular:     Rate and Rhythm: Normal rate and regular rhythm.     Pulses: Normal pulses.     Heart sounds: Normal heart sounds.  Pulmonary:     Effort: Pulmonary effort is normal. No respiratory distress.     Breath sounds: Normal breath sounds.  Abdominal:     General: Abdomen is flat. Bowel sounds are normal.     Palpations: Abdomen is soft.     Tenderness: There is no abdominal tenderness.  Musculoskeletal:     Right lower leg: No edema.     Left lower leg: No edema.  Skin:    General: Skin is warm and dry.     Findings: No lesion.  Neurological:     General: No focal deficit present.     Mental Status: He is alert and oriented to person, place, and time. Mental status is at baseline.     ED Results / Procedures / Treatments   Labs (all labs ordered are listed, but only abnormal results are displayed) Labs Reviewed  BASIC METABOLIC PANEL WITH GFR - Abnormal; Notable for the following components:      Result Value   Glucose, Bld 117 (*)    Calcium 8.8 (*)    All other components within normal limits  CBC - Abnormal; Notable for the following components:   RDW 16.6 (*)    All other components within normal limits  BRAIN NATRIURETIC PEPTIDE -  Abnormal; Notable for the following components:   B Natriuretic Peptide 272.2 (*)    All other components within normal limits    EKG EKG Interpretation Date/Time:  Monday April 11 2023 12:51:56 EDT Ventricular Rate:  82 PR Interval:  190 QRS Duration:  104 QT Interval:  369 QTC Calculation:  431 R Axis:   33  Text Interpretation: Sinus rhythm Left atrial enlargement Nonspecific T abnormalities, diffuse leads No significant change since last tracing Confirmed by Jacalyn Lefevre [phone]) on 04/11/2023 1:34:10 PM  Radiology DG Chest 2 View Result Date: 04/11/2023 CLINICAL DATA:  Shortness of breath. EXAM: CHEST - 2 VIEW COMPARISON:  Chest radiograph dated 02/24/2023. FINDINGS: Stable mild cardiomegaly. Pulmonary vascular congestion with mild bilateral interstitial prominence. No pleural effusion or pneumothorax. No acute osseous abnormality. IMPRESSION: Stable mild cardiomegaly with pulmonary vascular congestion and possible mild interstitial edema. Electronically Signed   By: Hart Robinsons M.D.   On: 04/11/2023 15:32    Procedures Procedures    Medications Ordered in ED Medications - No data to display  ED Course/ Medical Decision Making/ A&P Clinical Course as of 04/12/23 1513  Mon Apr 11, 2023  1452 Ambulated well without hypoxia or increase RR [JB]    Clinical Course User Index [JB] Traves Majchrzak, Horald Chestnut, PA-C                                 Medical Decision Making Amount and/or Complexity of Data Reviewed Labs: ordered. Radiology: ordered.   Frank Flowers 63 y.o. presented today for shortness of breath.  Working DDx that I considered at this time includes, but not limited to, asthma/COPD exacerbation, URI, viral illness, anemia, ACS, PE, pneumonia, pleural effusion, lung mass.  R/o DDx: These are considered less likely due to history of present illness, physical exam, labs/imaging findings  Pmhx: COPD, tobacco use, CHF 05/2021 EF 30-35%, CAD  Review of prior external  notes: 04/07/23 OV for SOB   Unique Tests and My Interpretation:  CBC: Leukocytosis, no anemia BMP: No electrolyte abnormality, normal kidney function.  Potassium is 9.5 BNP: 272  EKG: Sinus with LAE  CXR: Shows stable cardiomegaly with mild vascular congestion on x-ray   Problem List / ED Course / Critical interventions / Medication management  Patient was sent to emergency room with primary complaint of elevated BNP.  Patient reports that he had labs done with primary care for shortness of breath.  He has history of heart failure.  On evaluation he reports that he is no longer feeling short of breath when he is exerting himself and he is actually feeling much better.  Reports he has had some mild weight loss since starting oral daily Lasix and feels the fluid has helped a lot. He denies any chest pain.  He is hemodynamically stable and well-appearing. He is not hypoxic.  Lungs are clear to auscultation bilaterally.  Will check basic labs, BNP, EKG and chest x-ray.  Plan to ambulate patient to make sure he is not hypoxic. Lab work overall reassuring.  BNP is elevated at 272.  Chest x-ray shows stable finding and mild pulmonary congestion. EKG without ST changes and appears stable from baseline.  Patient ambulated with steady gait and was not hypoxic.  Patient does not want admitted and does not want IV Lasix.  Given that he has overall reassuring lab work, vitals feel he is appropriate for discharge and close follow-up with cardiology.  I will place an ambulatory referral so patient can have help scheduling appointment. Offered IV Lasix during stay, patient reports that he dose not want Lasix here and that since he is feeling better he would like to continue using PO Lasix at home and follow up outpatient with cardiology. Patient requesting discharge immediatly.  Reevaluation of the patient after these medicines showed that the patient stayed the same Patients vitals assessed. Upon arrival patient  is hemodynamically stable.  I have reviewed the patients home medicines and have made adjustments as needed    Plan: Follow-up with cardiology.  Follow-up with primary care.  Return precautions given.  Take Lasix daily.  Return to ER with new or worsening symptoms.  Stable for discharge.         Final Clinical Impression(s) / ED Diagnoses Final diagnoses:  Shortness of breath  Acute systolic congestive heart failure (HCC)  SOB (shortness of breath)    Rx / DC Orders ED Discharge Orders     None         Smitty Knudsen, PA-C 04/12/23 1523    Jacalyn Lefevre, MD 04/16/23 1018

## 2023-04-20 ENCOUNTER — Telehealth: Payer: Self-pay | Admitting: *Deleted

## 2023-04-20 DIAGNOSIS — R0602 Shortness of breath: Secondary | ICD-10-CM

## 2023-04-20 NOTE — Progress Notes (Unsigned)
 Complex Care Management Note Care Guide Note  04/20/2023 Name: Frank Flowers MRN: [id number] DOB: October 15, 1960   Complex Care Management Outreach Attempts: An unsuccessful telephone outreach was attempted today to offer the patient information about available complex care management services.  Follow Up Plan:  Additional outreach attempts will be made to offer the patient complex care management information and services.   Encounter Outcome:  No Answer  Gwenevere Ghazi  [facility]  [facility], [facility] Guide  Direct Dial: [phone]  Fax [phone]

## 2023-04-22 NOTE — Progress Notes (Signed)
 Complex Care Management Note  Care Guide Note 04/22/2023 Name: Frank Flowers MRN: [id number] DOB: 1960/03/11  Frank Flowers is a 63 y.o. year old male who sees [facility] for primary care. I reached out to Frank Flowers by phone today to offer complex care management services.  Mr. Knoble was given information about Complex Care Management services today including:   The Complex Care Management services include support from the care team which includes your Nurse Care Manager, Clinical Social Worker, or Pharmacist.  The Complex Care Management team is here to help remove barriers to the health concerns and goals most important to you. Complex Care Management services are voluntary, and the patient may decline or stop services at any time by request to their care team member.   Complex Care Management Consent Status: Patient agreed to services and verbal consent obtained.   Follow up plan:  Telephone appointment with complex care management team member scheduled for:  4/15  Encounter Outcome:  Patient Scheduled  Gwenevere Ghazi  [facility]  [facility], [facility] Guide  Direct Dial: [phone]  Fax [phone]

## 2023-04-26 ENCOUNTER — Other Ambulatory Visit: Payer: Self-pay | Admitting: Licensed Clinical Social Worker

## 2023-04-26 NOTE — Patient Instructions (Signed)
 Visit Information  Thank you for taking time to visit with me today. Please don't hesitate to contact me if I can be of assistance to you before our next scheduled appointment.  Your next care management appointment is by telephone on 05/09/2023 at 1030am  Telephone follow up 05/09/2023  Please call the care guide team at [phone] if you need to cancel, schedule, or reschedule an appointment.   Please call 911 if you are experiencing a Mental Health or Behavioral Health Crisis or need someone to talk to.  Fletcher Humble MSW, LCSW Licensed Clinical Social Worker  [facility], Population Health Direct Dial: [phone]  Fax: [phone]
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
## 2023-05-04 ENCOUNTER — Telehealth: Payer: Self-pay

## 2023-05-04 NOTE — Progress Notes (Unsigned)
 Complex Care Management Note Care Guide Note  05/05/2023 Name: Frank Flowers MRN: [id number] DOB: 1960/03/19   Complex Care Management Outreach Attempts: An unsuccessful outreach was attempted for an appointment today.  Follow Up Plan:  No further outreach attempts will be made at this time. We have been unable to contact the patient to offer or enroll patient in complex care management services.  Encounter Outcome:  No Answer  [facility]  [facility], [facility] Care Management Assistant Direct Dial: [phone]  Fax: [phone]
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
## 2023-07-12 ENCOUNTER — Other Ambulatory Visit (HOSPITAL_COMMUNITY): Payer: Self-pay

## 2023-07-12 ENCOUNTER — Encounter: Payer: Self-pay | Admitting: Cardiology

## 2023-07-12 ENCOUNTER — Ambulatory Visit: Attending: Cardiology | Admitting: Cardiology

## 2023-07-12 ENCOUNTER — Telehealth: Payer: Self-pay

## 2023-07-12 VITALS — BP 107/74 | HR 89 | Ht 74.0 in | Wt 281.0 lb

## 2023-07-12 DIAGNOSIS — I10 Essential (primary) hypertension: Secondary | ICD-10-CM | POA: Diagnosis not present

## 2023-07-12 DIAGNOSIS — I2510 Atherosclerotic heart disease of native coronary artery without angina pectoris: Secondary | ICD-10-CM | POA: Insufficient documentation

## 2023-07-12 DIAGNOSIS — E7800 Pure hypercholesterolemia, unspecified: Secondary | ICD-10-CM | POA: Diagnosis present

## 2023-07-12 DIAGNOSIS — I255 Ischemic cardiomyopathy: Secondary | ICD-10-CM | POA: Insufficient documentation

## 2023-07-12 DIAGNOSIS — I5023 Acute on chronic systolic (congestive) heart failure: Secondary | ICD-10-CM | POA: Diagnosis present

## 2023-07-12 MED ORDER — FUROSEMIDE 20 MG PO TABS
60.0000 mg | ORAL_TABLET | Freq: Every day | ORAL | 3 refills | Status: DC
  Filled 2023-07-12: qty 90, 30d supply, fill #0

## 2023-07-12 MED ORDER — EMPAGLIFLOZIN 10 MG PO TABS
10.0000 mg | ORAL_TABLET | Freq: Every day | ORAL | 11 refills | Status: DC
  Filled 2023-07-12: qty 30, 30d supply, fill #0

## 2023-07-12 NOTE — Patient Instructions (Signed)
 Medication Instructions:   INCREASE FUROSEMIDE  TO 60 MG ONCE DAILY= 3 OF THE 20 MG TABLETS ONCE DAILY  START JARDIANCE 10 MG ONCE DAILY  *If you need a refill on your cardiac medications before your next appointment, please call your pharmacy*  Lab Work:  Your physician recommends that you return for lab work in: [facility]  If you have labs (blood work) drawn today and your tests are completely normal, you will receive your results only by: MyChart Message (if you have MyChart) OR A paper copy in the mail If you have any lab test that is abnormal or we need to change your treatment, we will call you to review the results.  Testing/Procedures:  Your physician has requested that you have an echocardiogram. Echocardiography is a painless test that uses sound waves to create images of your heart. It provides your doctor with information about the size and shape of your heart and how well your heart's chambers and valves are working. This procedure takes approximately one hour. There are no restrictions for this procedure. Please do NOT wear cologne, perfume, aftershave, or lotions (deodorant is allowed). Please arrive 15 minutes prior to your appointment time.  Please note: We ask at that you not bring children with you during ultrasound (echo/ vascular) testing. Due to room size and safety concerns, children are not allowed in the ultrasound rooms during exams. Our front office staff cannot provide observation of children in our lobby area while testing is being conducted. An adult accompanying a patient to their appointment will only be allowed in the ultrasound room at the discretion of the ultrasound technician under special circumstances. We apologize for any inconvenience. [address]-SCHEDULE IN 3 MONTHS    Follow-Up: At [facility], you and your health needs are our priority.  As part of our continuing mission to provide you with exceptional heart care, our  providers are all part of one team.  This team includes your primary Cardiologist (physician) and Advanced Practice Providers or APPs (Physician Assistants and Nurse Practitioners) who all work together to provide you with the care you need, when you need it.  Your next appointment:   4 month(s)  Provider:   Redell Shallow, MD

## 2023-07-12 NOTE — Telephone Encounter (Signed)
 Pharmacy Patient Advocate Encounter   Received notification from Patient Pharmacy that prior authorization for JARDIANCE is required/requested.   Insurance verification completed.   The patient is insured through [facility] MEDICAID .   Per test claim: PA required; PA submitted to above mentioned insurance via CoverMyMeds Key/confirmation #/EOC [id number] Status is pending

## 2023-07-12 NOTE — Telephone Encounter (Signed)
 Pharmacy Patient Advocate Encounter  Received notification from [facility] MEDICAID that Prior Authorization for JARDIANCE has been APPROVED from 07/12/23 to 07/11/24. Ran test claim, Copay is $4. This test claim was processed through [facility]- copay amounts may vary at other pharmacies due to pharmacy/plan contracts, or as the patient moves through the different stages of their insurance plan.

## 2023-07-13 ENCOUNTER — Other Ambulatory Visit (HOSPITAL_COMMUNITY): Payer: Self-pay

## 2023-07-23 LAB — LIPID PANEL
Chol/HDL Ratio: 4.1 ratio (ref 0.0–5.0)
Cholesterol, Total: 200 mg/dL — ABNORMAL HIGH (ref 100–199)
HDL: 49 mg/dL (ref >39)
LDL Chol Calc (NIH): 135 mg/dL — ABNORMAL HIGH (ref 0–99)
Triglycerides: 87 mg/dL (ref 0–149)
VLDL Cholesterol Cal: 16 mg/dL (ref 5–40)

## 2023-07-23 LAB — COMPREHENSIVE METABOLIC PANEL WITH GFR
ALT: 8 IU/L (ref 0–44)
AST: 18 IU/L (ref 0–40)
Albumin: 4.1 g/dL (ref 3.9–4.9)
Alkaline Phosphatase: 73 IU/L (ref 44–121)
BUN/Creatinine Ratio: 16 (ref 10–24)
BUN: 19 mg/dL (ref 8–27)
Bilirubin Total: 0.4 mg/dL (ref 0.0–1.2)
CO2: 22 mmol/L (ref 20–29)
Calcium: 9.4 mg/dL (ref 8.6–10.2)
Chloride: 101 mmol/L (ref 96–106)
Creatinine, Ser: 1.2 mg/dL (ref 0.76–1.27)
Globulin, Total: 3.6 g/dL (ref 1.5–4.5)
Glucose: 86 mg/dL (ref 70–99)
Potassium: 3.9 mmol/L (ref 3.5–5.2)
Sodium: 141 mmol/L (ref 134–144)
Total Protein: 7.7 g/dL (ref 6.0–8.5)
eGFR: 68 mL/min/1.73 (ref >59)

## 2023-07-23 LAB — PRO B NATRIURETIC PEPTIDE: NT-Pro BNP: 497 pg/mL — AB (ref 0–210)

## 2023-07-24 ENCOUNTER — Ambulatory Visit: Payer: Self-pay | Admitting: Cardiology

## 2023-08-26 NOTE — Telephone Encounter (Signed)
 Letter of results sent to pt asking him to cal to discuss

## 2023-09-30 ENCOUNTER — Telehealth: Payer: Self-pay | Admitting: Cardiology

## 2023-09-30 NOTE — Telephone Encounter (Signed)
 Patient says he is returning a call regarding lab results and medications. Please advise.

## 2023-09-30 NOTE — Telephone Encounter (Signed)
 Letter was mailed to patient in July.  Do you still want to proceed with these recommendations?   Chris RN  Add zetia 10 mg daily; continue crestor  40 mg daily; lipids and liver 8 weeks Redell Shallow

## 2023-10-01 ENCOUNTER — Inpatient Hospital Stay (HOSPITAL_COMMUNITY)
Admission: EM | Admit: 2023-10-01 | Discharge: 2023-10-04 | DRG: 291 | Disposition: A | Discharge status: Home / Self Care | Attending: Internal Medicine | Admitting: Internal Medicine

## 2023-10-01 ENCOUNTER — Other Ambulatory Visit: Payer: Self-pay

## 2023-10-01 ENCOUNTER — Emergency Department (HOSPITAL_COMMUNITY)

## 2023-10-01 DIAGNOSIS — J441 Chronic obstructive pulmonary disease with (acute) exacerbation: Secondary | ICD-10-CM | POA: Diagnosis present

## 2023-10-01 DIAGNOSIS — Z79899 Other long term (current) drug therapy: Secondary | ICD-10-CM

## 2023-10-01 DIAGNOSIS — I10 Essential (primary) hypertension: Secondary | ICD-10-CM | POA: Diagnosis not present

## 2023-10-01 DIAGNOSIS — Z7984 Long term (current) use of oral hypoglycemic drugs: Secondary | ICD-10-CM | POA: Diagnosis not present

## 2023-10-01 DIAGNOSIS — Z96641 Presence of right artificial hip joint: Secondary | ICD-10-CM | POA: Diagnosis present

## 2023-10-01 DIAGNOSIS — I5022 Chronic systolic (congestive) heart failure: Secondary | ICD-10-CM | POA: Diagnosis not present

## 2023-10-01 DIAGNOSIS — J9601 Acute respiratory failure with hypoxia: Secondary | ICD-10-CM | POA: Diagnosis present

## 2023-10-01 DIAGNOSIS — R0603 Acute respiratory distress: Secondary | ICD-10-CM | POA: Diagnosis not present

## 2023-10-01 DIAGNOSIS — I2510 Atherosclerotic heart disease of native coronary artery without angina pectoris: Secondary | ICD-10-CM | POA: Diagnosis present

## 2023-10-01 DIAGNOSIS — I252 Old myocardial infarction: Secondary | ICD-10-CM

## 2023-10-01 DIAGNOSIS — I110 Hypertensive heart disease with heart failure: Secondary | ICD-10-CM | POA: Diagnosis present

## 2023-10-01 DIAGNOSIS — I340 Nonrheumatic mitral (valve) insufficiency: Secondary | ICD-10-CM | POA: Diagnosis present

## 2023-10-01 DIAGNOSIS — Z8249 Family history of ischemic heart disease and other diseases of the circulatory system: Secondary | ICD-10-CM

## 2023-10-01 DIAGNOSIS — F17210 Nicotine dependence, cigarettes, uncomplicated: Secondary | ICD-10-CM | POA: Diagnosis present

## 2023-10-01 DIAGNOSIS — Z955 Presence of coronary angioplasty implant and graft: Secondary | ICD-10-CM | POA: Diagnosis not present

## 2023-10-01 DIAGNOSIS — R0602 Shortness of breath: Secondary | ICD-10-CM | POA: Diagnosis present

## 2023-10-01 DIAGNOSIS — I255 Ischemic cardiomyopathy: Secondary | ICD-10-CM | POA: Diagnosis present

## 2023-10-01 DIAGNOSIS — Z880 Allergy status to penicillin: Secondary | ICD-10-CM | POA: Diagnosis not present

## 2023-10-01 DIAGNOSIS — I5023 Acute on chronic systolic (congestive) heart failure: Secondary | ICD-10-CM | POA: Diagnosis present

## 2023-10-01 DIAGNOSIS — E785 Hyperlipidemia, unspecified: Secondary | ICD-10-CM | POA: Diagnosis present

## 2023-10-01 DIAGNOSIS — Z791 Long term (current) use of non-steroidal anti-inflammatories (NSAID): Secondary | ICD-10-CM

## 2023-10-01 DIAGNOSIS — Z7982 Long term (current) use of aspirin: Secondary | ICD-10-CM

## 2023-10-01 DIAGNOSIS — I509 Heart failure, unspecified: Secondary | ICD-10-CM

## 2023-10-01 LAB — COMPREHENSIVE METABOLIC PANEL WITH GFR
ALT: 12 U/L (ref 0–44)
AST: 24 U/L (ref 15–41)
Albumin: 3.5 g/dL (ref 3.5–5.0)
Alkaline Phosphatase: 60 U/L (ref 38–126)
Anion gap: 15 (ref 5–15)
BUN: 20 mg/dL (ref 8–23)
CO2: 22 mmol/L (ref 22–32)
Calcium: 9 mg/dL (ref 8.9–10.3)
Chloride: 101 mmol/L (ref 98–111)
Creatinine, Ser: 1.12 mg/dL (ref 0.61–1.24)
GFR, Estimated: >60 mL/min (ref >60)
Glucose, Bld: 120 mg/dL — ABNORMAL HIGH (ref 70–99)
Potassium: 3.5 mmol/L (ref 3.5–5.1)
Sodium: 138 mmol/L (ref 135–145)
Total Bilirubin: 1.3 mg/dL — ABNORMAL HIGH (ref 0.0–1.2)
Total Protein: 7.4 g/dL (ref 6.5–8.1)

## 2023-10-01 LAB — CBC WITH DIFFERENTIAL/PLATELET
Abs Immature Granulocytes: 0.02 K/uL (ref 0.00–0.07)
Basophils Absolute: 0.1 K/uL (ref 0.0–0.1)
Basophils Relative: 1 %
Eosinophils Absolute: 0.1 K/uL (ref 0.0–0.5)
Eosinophils Relative: 1 %
HCT: 49 % (ref 39.0–52.0)
Hemoglobin: 15.2 g/dL (ref 13.0–17.0)
Immature Granulocytes: 0 %
Lymphocytes Relative: 27 %
Lymphs Abs: 1.7 K/uL (ref 0.7–4.0)
MCH: 28.2 pg (ref 26.0–34.0)
MCHC: 31 g/dL (ref 30.0–36.0)
MCV: 90.9 fL (ref 80.0–100.0)
Monocytes Absolute: 0.3 K/uL (ref 0.1–1.0)
Monocytes Relative: 5 %
Neutro Abs: 4.3 K/uL (ref 1.7–7.7)
Neutrophils Relative %: 66 %
Platelets: 192 K/uL (ref 150–400)
RBC: 5.39 MIL/uL (ref 4.22–5.81)
RDW: 17.1 % — ABNORMAL HIGH (ref 11.5–15.5)
WBC: 6.5 K/uL (ref 4.0–10.5)
nRBC: 0 % (ref 0.0–0.2)

## 2023-10-01 LAB — BLOOD GAS, ARTERIAL
Acid-Base Excess: 5.4 mmol/L — ABNORMAL HIGH (ref 0.0–2.0)
Bicarbonate: 29.9 mmol/L — ABNORMAL HIGH (ref 20.0–28.0)
Drawn by: 560031
O2 Saturation: 100 %
Patient temperature: 36.1
pCO2 arterial: 40 mmHg (ref 32–48)
pH, Arterial: 7.47 — ABNORMAL HIGH (ref 7.35–7.45)
pO2, Arterial: 153 mmHg — ABNORMAL HIGH (ref 83–108)

## 2023-10-01 LAB — PRO BRAIN NATRIURETIC PEPTIDE: Pro Brain Natriuretic Peptide: 2261 pg/mL — ABNORMAL HIGH (ref <300.0)

## 2023-10-01 LAB — TROPONIN T, HIGH SENSITIVITY
Troponin T High Sensitivity: 37 ng/L — ABNORMAL HIGH (ref 0–19)
Troponin T High Sensitivity: 37 ng/L — ABNORMAL HIGH (ref 0–19)

## 2023-10-01 MED ORDER — DOCUSATE SODIUM 100 MG PO CAPS
100.0000 mg | ORAL_CAPSULE | Freq: Two times a day (BID) | ORAL | Status: DC
  Administered 2023-10-03 – 2023-10-04 (×3): 100 mg via ORAL
  Filled 2023-10-01 (×5): qty 1

## 2023-10-01 MED ORDER — SORBITOL 70 % SOLN: 30.0000 mL | Freq: Every day | Status: DC | PRN

## 2023-10-01 MED ORDER — METHYLPREDNISOLONE SODIUM SUCC 40 MG IJ SOLR
40.0000 mg | INTRAMUSCULAR | Status: DC
  Administered 2023-10-02 – 2023-10-04 (×3): 40 mg via INTRAVENOUS
  Filled 2023-10-01 (×3): qty 1

## 2023-10-01 MED ORDER — SENNA 8.6 MG PO TABS
2.0000 | ORAL_TABLET | Freq: Every day | ORAL | Status: DC
Start: 2023-10-01 — End: 2023-10-04
  Administered 2023-10-03: 17.2 mg via ORAL
  Filled 2023-10-01 (×2): qty 2

## 2023-10-01 MED ORDER — CARVEDILOL 3.125 MG PO TABS: 6.2500 mg | ORAL_TABLET | Freq: Two times a day (BID) | ORAL | Status: DC

## 2023-10-01 MED ORDER — IPRATROPIUM-ALBUTEROL 0.5-2.5 (3) MG/3ML IN SOLN
3.0000 mL | Freq: Once | RESPIRATORY_TRACT | Status: AC
  Administered 2023-10-01: 3 mL via RESPIRATORY_TRACT
  Filled 2023-10-01: qty 3

## 2023-10-01 MED ORDER — IPRATROPIUM BROMIDE 0.02 % IN SOLN
1.0000 mg | Freq: Once | RESPIRATORY_TRACT | Status: AC
  Administered 2023-10-01: 1 mg via RESPIRATORY_TRACT
  Filled 2023-10-01: qty 5

## 2023-10-01 MED ORDER — FUROSEMIDE 10 MG/ML IJ SOLN
60.0000 mg | Freq: Once | INTRAMUSCULAR | Status: AC
  Administered 2023-10-01: 60 mg via INTRAVENOUS
  Filled 2023-10-01: qty 8

## 2023-10-01 MED ORDER — ONDANSETRON HCL 4 MG/2ML IJ SOLN: 4.0000 mg | Freq: Four times a day (QID) | INTRAMUSCULAR | Status: DC | PRN

## 2023-10-01 MED ORDER — FUROSEMIDE 10 MG/ML IJ SOLN
60.0000 mg | Freq: Every day | INTRAMUSCULAR | Status: DC
  Administered 2023-10-02 – 2023-10-04 (×3): 60 mg via INTRAVENOUS
  Filled 2023-10-01 (×2): qty 6
  Filled 2023-10-01: qty 8

## 2023-10-01 MED ORDER — ALBUTEROL SULFATE (2.5 MG/3ML) 0.083% IN NEBU
10.0000 mg/h | INHALATION_SOLUTION | Freq: Once | RESPIRATORY_TRACT | Status: AC
  Administered 2023-10-01: 10 mg/h via RESPIRATORY_TRACT
  Filled 2023-10-01: qty 12

## 2023-10-01 MED ORDER — ONDANSETRON HCL 4 MG PO TABS: 4.0000 mg | ORAL_TABLET | Freq: Four times a day (QID) | ORAL | Status: DC | PRN

## 2023-10-01 MED ORDER — MAGNESIUM SULFATE 2 GM/50ML IV SOLN
2.0000 g | Freq: Once | INTRAVENOUS | Status: AC
  Administered 2023-10-01: 2 g via INTRAVENOUS
  Filled 2023-10-01: qty 50

## 2023-10-01 MED ORDER — METHYLPREDNISOLONE SODIUM SUCC 125 MG IJ SOLR
125.0000 mg | Freq: Once | INTRAMUSCULAR | Status: AC
  Administered 2023-10-01: 125 mg via INTRAVENOUS
  Filled 2023-10-01: qty 2

## 2023-10-01 MED ORDER — ALBUTEROL SULFATE (2.5 MG/3ML) 0.083% IN NEBU: 2.5000 mg | INHALATION_SOLUTION | RESPIRATORY_TRACT | Status: DC | PRN

## 2023-10-01 MED ORDER — ACETAMINOPHEN 650 MG RE SUPP: 650.0000 mg | Freq: Four times a day (QID) | RECTAL | Status: DC | PRN

## 2023-10-01 MED ORDER — SPIRONOLACTONE 12.5 MG HALF TABLET
12.5000 mg | ORAL_TABLET | Freq: Every day | ORAL | Status: DC
  Administered 2023-10-02 – 2023-10-04 (×3): 12.5 mg via ORAL
  Filled 2023-10-01 (×3): qty 1

## 2023-10-01 MED ORDER — ASPIRIN 81 MG PO TBEC
81.0000 mg | DELAYED_RELEASE_TABLET | Freq: Every day | ORAL | Status: DC
  Administered 2023-10-02 – 2023-10-04 (×3): 81 mg via ORAL
  Filled 2023-10-01 (×4): qty 1

## 2023-10-01 MED ORDER — LORAZEPAM 2 MG/ML IJ SOLN
1.0000 mg | Freq: Once | INTRAMUSCULAR | Status: AC
  Administered 2023-10-01: 1 mg via INTRAVENOUS
  Filled 2023-10-01: qty 1

## 2023-10-01 MED ORDER — ENOXAPARIN SODIUM 40 MG/0.4ML IJ SOSY
40.0000 mg | PREFILLED_SYRINGE | INTRAMUSCULAR | Status: DC
  Administered 2023-10-02 – 2023-10-03 (×2): 40 mg via SUBCUTANEOUS
  Filled 2023-10-01 (×2): qty 0.4

## 2023-10-01 MED ORDER — IPRATROPIUM-ALBUTEROL 0.5-2.5 (3) MG/3ML IN SOLN
3.0000 mL | Freq: Four times a day (QID) | RESPIRATORY_TRACT | Status: DC
  Administered 2023-10-01 – 2023-10-04 (×11): 3 mL via RESPIRATORY_TRACT
  Filled 2023-10-01 (×11): qty 3

## 2023-10-01 MED ORDER — ACETAMINOPHEN 325 MG PO TABS
650.0000 mg | ORAL_TABLET | Freq: Four times a day (QID) | ORAL | Status: DC | PRN
  Administered 2023-10-02 (×2): 650 mg via ORAL
  Filled 2023-10-01 (×2): qty 2

## 2023-10-01 NOTE — Progress Notes (Signed)
   10/01/23 1706  BiPAP/CPAP/SIPAP  $ Non-Invasive Ventilator  Non-Invasive Vent Set Up;Non-Invasive Vent Initial  BiPAP/CPAP/SIPAP Pt Type Adult  BiPAP/CPAP/SIPAP V60  Mask Type Full face mask  Dentures removed? Not applicable  Mask Size (S)  Medium  Set Rate (S)  24 breaths/min  Respiratory Rate 26 breaths/min  IPAP (S)  16 cmH20  EPAP (S)  8 cmH2O  FiO2 (%) (S)  40 %  Flow Rate 0.9 lpm  Minute Ventilation 9.3  Leak 4  Peak Inspiratory Pressure (PIP) 16  Tidal Volume (Vt) 401  Patient Home Machine No  Patient Home Mask No  Patient Home Tubing No  Auto Titrate No  Press High Alarm 40 cmH2O  Press Low Alarm 10 cmH2O  Nasal massage performed No (comment)  CPAP/SIPAP surface wiped down Yes  Device Plugged into RED Power Outlet Yes  Oxygen Percent 40 %  BiPAP/CPAP /SiPAP Vitals  Pulse Rate 87  Resp (!) 26  SpO2 100 %  Bilateral Breath Sounds Expiratory wheezes  MEWS Score/Color  MEWS Score 2  MEWS Score Color Yellow

## 2023-10-01 NOTE — Progress Notes (Signed)
   10/01/23 1952  BiPAP/CPAP/SIPAP  BiPAP/CPAP/SIPAP Pt Type Adult  BiPAP/CPAP/SIPAP V60  Mask Type Full face mask  Dentures removed? Not applicable  Mask Size Medium  Set Rate 24 breaths/min  Respiratory Rate 29 breaths/min  IPAP 16 cmH20  EPAP 8 cmH2O  FiO2 (%) 28 %  Minute Ventilation 14.3  Leak 3  Peak Inspiratory Pressure (PIP) 16  Tidal Volume (Vt) 527  Patient Home Machine No  Patient Home Mask No  Patient Home Tubing No  Auto Titrate No  Press High Alarm 40 cmH2O  Press Low Alarm 10 cmH2O  Nasal massage performed No (comment)  BiPAP/CPAP /SiPAP Vitals  Pulse Rate 86  Resp (!) 21  SpO2 98 %  MEWS Score/Color  MEWS Score 1  MEWS Score Color Green

## 2023-10-01 NOTE — ED Provider Notes (Addendum)
 [employer] EMERGENCY DEPARTMENT AT [facility] Provider Note   CSN: [id number] Arrival date & time: 10/01/23  1413     Patient presents with: Shortness of Breath   Frank Flowers is a 63 y.o. male.   HPI Patient reports for about 3 days. he has been having shortness of breath.  He reports it is worse trying to lie flat in bed.  Patient ports he is kind of used to that however.  He reports he has had shortness of breath for a long time.  He endorses a history of CHF, asthma and COPD.  Patient reports that past few days however he has really had trouble catching his breath.  He reports he is trying to cough but does not feel he has got much forced to cough anything up.  He reports his chest feels generally somewhat tight but no focal pain.  No fever.  No abdominal pain no lower extremity swelling.  Patient reports he has an inhaler that he is try to use but has not got much relief.  Patient does smoke.  He reports he is cut down to about 8 cigarettes/day.    Prior to Admission medications   Medication Sig Start Date End Date Taking? Authorizing Provider  albuterol  (VENTOLIN  HFA) 108 (90 Base) MCG/ACT inhaler Inhale 2 puffs into the lungs every 6 (six) hours as needed for wheezing or shortness of breath.  02/23/19   [provider]  aspirin  81 MG chewable tablet Chew 1 tablet (81 mg total) by mouth 2 (two) times daily. 09/18/20   Swinteck, Redell, MD  carvedilol  (COREG ) 6.25 MG tablet Take 1 tablet (6.25 mg total) by mouth 2 (two) times daily. 11/06/19   Pietro Redell RAMAN, MD  docusate sodium  (COLACE) 100 MG capsule Take 1 capsule (100 mg total) by mouth 2 (two) times daily. 09/18/20   Swinteck, Redell, MD  empagliflozin  (JARDIANCE ) 10 MG TABS tablet Take 1 tablet (10 mg total) by mouth daily before breakfast. 07/12/23   Pietro Redell RAMAN, MD  furosemide  (LASIX ) 20 MG tablet Take 3 tablets (60 mg total) by mouth daily. 07/12/23   Pietro Redell RAMAN, MD  gabapentin  (NEURONTIN ) 300 MG  capsule Take 300 mg by mouth 3 (three) times daily. 02/23/19   [provider]  guaiFENesin  (MUCINEX ) 600 MG 12 hr tablet Take 1 tablet (600 mg total) by mouth 2 (two) times daily. 02/25/23   Small, Brooke L, PA  HYDROcodone -acetaminophen  (NORCO/VICODIN) 5-325 MG tablet Take 1 tablet by mouth every 4 (four) hours as needed for moderate pain (pain score 4-6). 09/18/20   Swinteck, Redell, MD  ipratropium-albuterol  (DUONEB) 0.5-2.5 (3) MG/3ML SOLN Take 3 mLs by nebulization every 6 (six) hours as needed (shortness of breath, wheezing). 02/25/23   Small, Brooke L, PA  loratadine  (CLARITIN ) 10 MG tablet Take 10 mg by mouth daily as needed for allergies.    [provider]  meloxicam  (MOBIC ) 15 MG tablet Take 15 mg by mouth daily. 02/23/19   [provider]  naproxen  (NAPROSYN ) 375 MG tablet Take 1 tablet (375 mg total) by mouth 2 (two) times daily. 09/15/22   Minnie Tinnie BRAVO, PA  ondansetron  (ZOFRAN ) 4 MG tablet Take 1 tablet (4 mg total) by mouth every 6 (six) hours as needed for nausea. 09/18/20   Swinteck, Redell, MD  predniSONE  (STERAPRED UNI-PAK 21 TAB) 10 MG (21) TBPK tablet Take by mouth daily. Take 6 tabs by mouth daily  for 2 days, then 5 tabs for  2 days, then 4 tabs for 2 days, then 3 tabs for 2 days, 2 tabs for 2 days, then 1 tab by mouth daily for 2 days 02/25/23   Small, Brooke L, PA  rosuvastatin  (CRESTOR ) 40 MG tablet Take 1 tablet (40 mg total) by mouth daily at 6 PM. 06/08/19   Crenshaw, Redell RAMAN, MD  sacubitril -valsartan  (ENTRESTO ) 49-51 MG Take 1 tablet by mouth 2 (two) times daily. 04/18/20   Pietro Redell RAMAN, MD  senna (SENOKOT) 8.6 MG TABS tablet Take 2 tablets (17.2 mg total) by mouth at bedtime. 09/18/20   Swinteck, Redell, MD  sertraline  (ZOLOFT ) 100 MG tablet Take 100 mg by mouth at bedtime.  07/25/19   [provider]  spironolactone  (ALDACTONE ) 25 MG tablet Take 12.5 mg by mouth daily. 03/24/20   [provider]  terbinafine  (LAMISIL ) 250 MG tablet Take 1  tablet (250 mg total) by mouth daily. 08/24/22   Scherrie Lamarr SQUIBB, DPM    Allergies: Penicillins    Review of Systems  Updated Vital Signs BP 120/88   Pulse 87   Temp (!) 97.5 F (36.4 C) (Oral)   Resp (!) 26   Ht 6' 2 (1.88 m)   Wt 127.9 kg   SpO2 91%   BMI 36.21 kg/m   Physical Exam Constitutional:      Comments: Alert nontoxic.  Mild increased work of breathing at rest.  Speaking in full sentences.  HENT:     Mouth/Throat:     Pharynx: Oropharynx is clear.  Eyes:     Extraocular Movements: Extraocular movements intact.  Cardiovascular:     Rate and Rhythm: Normal rate and regular rhythm.  Pulmonary:     Comments: Mild increased work of breathing.  Intermittent tight cough.  Extensive wheezing both lung fields.  Adequate airflow to the bases. Abdominal:     General: There is no distension.     Palpations: Abdomen is soft.     Tenderness: There is no abdominal tenderness. There is no guarding.  Musculoskeletal:        General: No swelling or tenderness. Normal range of motion.     Right lower leg: No edema.     Left lower leg: No edema.     Comments: No significant peripheral edema.  Calf soft and nontender.  Skin:    General: Skin is warm and dry.  Neurological:     General: No focal deficit present.     Mental Status: He is oriented to person, place, and time.     Motor: No weakness.     Coordination: Coordination normal.  Psychiatric:        Mood and Affect: Mood normal.     (all labs ordered are listed, but only abnormal results are displayed) Labs Reviewed  COMPREHENSIVE METABOLIC PANEL WITH GFR - Abnormal; Notable for the following components:      Result Value   Glucose, Bld 120 (*)    Total Bilirubin 1.3 (*)    All other components within normal limits  CBC WITH DIFFERENTIAL/PLATELET - Abnormal; Notable for the following components:   RDW 17.1 (*)    All other components within normal limits  PRO BRAIN NATRIURETIC PEPTIDE - Abnormal; Notable  for the following components:   Pro Brain Natriuretic Peptide 2,261.0 (*)    All other components within normal limits  BLOOD GAS, ARTERIAL - Abnormal; Notable for the following components:   pH, Arterial 7.47 (*)    pO2, Arterial 153 (*)  Bicarbonate 29.9 (*)    Acid-Base Excess 5.4 (*)    All other components within normal limits  TROPONIN T, HIGH SENSITIVITY - Abnormal; Notable for the following components:   Troponin T High Sensitivity 37 (*)    All other components within normal limits  TROPONIN T, HIGH SENSITIVITY    EKG: None EKG showing a demographic mismatch.  I have reviewed EKG.  No significant change from previous.  No acute ischemic appearance. Radiology: [facility] Chest Port 1 View Result Date: 10/01/2023 CLINICAL DATA:  Increasing shortness of breath. EXAM: PORTABLE CHEST 1 VIEW COMPARISON:  10/01/2023. FINDINGS: The heart is enlarged and the mediastinal contour stable. There is atherosclerotic calcification of the aorta. Lung volumes are low. No consolidation, effusion, or pneumothorax is seen. No acute osseous abnormality. IMPRESSION: 1. No active disease. 2. Cardiomegaly. Electronically Signed   By: Leita Birmingham M.D.   On: 10/01/2023 17:44   DG Chest Port 1 View Result Date: 10/01/2023 EXAM: 1 VIEW XRAY OF THE CHEST 10/01/2023 03:09:00 PM COMPARISON: 04/11/2023 CLINICAL HISTORY: sob. Pt reports shob the last 2 days, cannot lay down flat, coughing up fluid. Also having bad anxiety about difficulty breathing. FINDINGS: LUNGS AND PLEURA: No focal pulmonary opacity. No pulmonary edema. No pleural effusion. No pneumothorax. HEART AND MEDIASTINUM: Cardiomegaly. Calcified aorta. BONES AND SOFT TISSUES: No acute osseous abnormality. IMPRESSION: 1. No acute cardiopulmonary process. 2. Cardiomegaly. 3. Aortic atherosclerosis i. Electronically signed by: Birmingham Calk MD 10/01/2023 03:24 PM EDT RP Workstation: [id number]     Procedures  CRITICAL CARE Performed by: Ludivina Shines   Total critical care time: 60 minutes  Critical care time was exclusive of separately billable procedures and treating other patients.  Critical care was necessary to treat or prevent imminent or life-threatening deterioration.  Critical care was time spent personally by me on the following activities: development of treatment plan with patient and/or surrogate as well as nursing, discussions with consultants, evaluation of patient's response to treatment, examination of patient, obtaining history from patient or surrogate, ordering and performing treatments and interventions, ordering and review of laboratory studies, ordering and review of radiographic studies, pulse oximetry and re-evaluation of patient's condition.  Medications Ordered in the ED  magnesium  sulfate IVPB 2 g 50 mL (2 g Intravenous New Bag/Given 10/01/23 1740)  ipratropium-albuterol  (DUONEB) 0.5-2.5 (3) MG/3ML nebulizer solution 3 mL (3 mLs Nebulization Given 10/01/23 1535)  methylPREDNISolone  sodium succinate (SOLU-MEDROL ) 125 mg/2 mL injection 125 mg (125 mg Intravenous Given 10/01/23 1535)  albuterol  (PROVENTIL ) (2.5 MG/3ML) 0.083% nebulizer solution (10 mg/hr Nebulization Given 10/01/23 1715)  ipratropium (ATROVENT ) nebulizer solution 1 mg (1 mg Nebulization Given 10/01/23 1643)  LORazepam  (ATIVAN ) injection 1 mg (1 mg Intravenous Given 10/01/23 1642)  furosemide  (LASIX ) injection 60 mg (60 mg Intravenous Given 10/01/23 1738)                                    Medical Decision Making Amount and/or Complexity of Data Reviewed Labs: ordered. Radiology: ordered.  Risk Prescription drug management. Decision regarding hospitalization.   Patient presents as outlined.  He had 3 days of worsening shortness of breath from his baseline.  Patient does endorse history of COPD and CHF.  At this time patient has extensive wheezing but does not have notable crackles or peripheral edema.  Will start DuoNeb and Solu-Medrol .   Will continue diagnostic workup to rule out ACS\CHF\COPD\pneumonia.  Portable chest x-ray reviewed  by myself and interpretation by cardiology.  No significant vascular congestion, no pneumothorax, no focal consolidations.  Cardiomegaly.  Patient continued to report significant feeling of shortness of breath and at times waxing and waning anxiety with shortness of breath.  When calm, he did not appear to have significant increased work of breathing but when left alone or phone conversation, patient had reports of significant feeling of shortness of breath and increased respiratory rate.  With all repeat auscultations there is similar wheezing present but with adequate airflow.  After first neb treatment, patient seemed to have some improvement but still subjectively felt short of breath and did have still persisting wheezing by auscultation.  At that time I opted for continuous albuterol  therapy and Ativan  for anxiety.  After completing neb therapy, patient did not show significant improvement and continued to feel short of breath.  Repeat auscultation still shows wheezing but now questionable increased sound of congestion.  Patient does also have history of CHF with a EF of 30 to 40% previously.  Cardiomegaly is present on chest x-ray but not vascular congestion.  BNP 2261.  Will repeat chest x-ray to see if any congestion has substantively developed since starting treatment and administer Lasix  60 mg IV with trial on BiPAP.  Patient will require admission.  Repeat chest x-ray personally reviewed by myself.  Persistent cardiomegaly but does not appear to be a significant change in terms of vascular congestion.  Patient is tolerating the BiPAP well.  Maintaining 100% oxygen.  Blood pressures are stable.  He continues to be somewhat somnolent but arouses to stimulus.       Final diagnoses:  COPD exacerbation (HCC)  Acute on chronic congestive heart failure, unspecified heart failure type [facility])     ED Discharge Orders     None          Armenta Canning, MD 10/01/23 1709    Armenta Canning, MD 10/01/23 1711    Armenta Canning, MD 10/01/23 8251

## 2023-10-01 NOTE — ED Notes (Signed)
 Pt seemed to be very uncomfortable breathing, o2 sats at 98 RA, provider notified, respiratory called

## 2023-10-01 NOTE — ED Triage Notes (Signed)
 Pt reports shob the last 2 days, cannot lay down flat, coughing up fluid. Also having bad anxiety about difficulty breathing.
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
## 2023-10-02 ENCOUNTER — Encounter (HOSPITAL_COMMUNITY): Payer: Self-pay | Admitting: Internal Medicine

## 2023-10-02 ENCOUNTER — Telehealth: Payer: Self-pay | Admitting: Pulmonary Disease

## 2023-10-02 ENCOUNTER — Inpatient Hospital Stay (HOSPITAL_COMMUNITY)

## 2023-10-02 DIAGNOSIS — F17210 Nicotine dependence, cigarettes, uncomplicated: Secondary | ICD-10-CM | POA: Diagnosis not present

## 2023-10-02 DIAGNOSIS — I5022 Chronic systolic (congestive) heart failure: Secondary | ICD-10-CM

## 2023-10-02 DIAGNOSIS — R0603 Acute respiratory distress: Secondary | ICD-10-CM | POA: Diagnosis not present

## 2023-10-02 DIAGNOSIS — J9601 Acute respiratory failure with hypoxia: Secondary | ICD-10-CM | POA: Diagnosis not present

## 2023-10-02 DIAGNOSIS — I5023 Acute on chronic systolic (congestive) heart failure: Secondary | ICD-10-CM | POA: Diagnosis not present

## 2023-10-02 LAB — BASIC METABOLIC PANEL WITH GFR
Anion gap: 15 (ref 5–15)
BUN: 22 mg/dL (ref 8–23)
CO2: 23 mmol/L (ref 22–32)
Calcium: 9.2 mg/dL (ref 8.9–10.3)
Chloride: 101 mmol/L (ref 98–111)
Creatinine, Ser: 1.02 mg/dL (ref 0.61–1.24)
GFR, Estimated: >60 mL/min (ref >60)
Glucose, Bld: 127 mg/dL — ABNORMAL HIGH (ref 70–99)
Potassium: 3.9 mmol/L (ref 3.5–5.1)
Sodium: 138 mmol/L (ref 135–145)

## 2023-10-02 LAB — ECHOCARDIOGRAM COMPLETE
Area-P 1/2: 5.34 cm2
Calc EF: 30.8 %
Height: 74 in
MV M vel: 4.51 m/s
MV Peak grad: 81.4 mmHg
Radius: 0.5 cm
S' Lateral: 4.7 cm
Single Plane A2C EF: 26.4 %
Single Plane A4C EF: 34 %
Weight: 4512 [oz_av]

## 2023-10-02 LAB — TSH: TSH: 0.303 u[IU]/mL — ABNORMAL LOW (ref 0.350–4.500)

## 2023-10-02 LAB — HIV ANTIBODY (ROUTINE TESTING W REFLEX): HIV Screen 4th Generation wRfx: NONREACTIVE

## 2023-10-02 LAB — CBC
HCT: 51.1 % (ref 39.0–52.0)
Hemoglobin: 15.9 g/dL (ref 13.0–17.0)
MCH: 28.2 pg (ref 26.0–34.0)
MCHC: 31.1 g/dL (ref 30.0–36.0)
MCV: 90.6 fL (ref 80.0–100.0)
Platelets: 218 K/uL (ref 150–400)
RBC: 5.64 MIL/uL (ref 4.22–5.81)
RDW: 16.8 % — ABNORMAL HIGH (ref 11.5–15.5)
WBC: 5.9 K/uL (ref 4.0–10.5)
nRBC: 0 % (ref 0.0–0.2)

## 2023-10-02 LAB — MAGNESIUM: Magnesium: 2.4 mg/dL (ref 1.7–2.4)

## 2023-10-02 LAB — D-DIMER, QUANTITATIVE: D-Dimer, Quant: 1.48 ug{FEU}/mL — ABNORMAL HIGH (ref 0.00–0.50)

## 2023-10-02 LAB — PRO BRAIN NATRIURETIC PEPTIDE: Pro Brain Natriuretic Peptide: 1437 pg/mL — ABNORMAL HIGH (ref <300.0)

## 2023-10-02 LAB — TROPONIN T, HIGH SENSITIVITY: Troponin T High Sensitivity: 31 ng/L — ABNORMAL HIGH (ref 0–19)

## 2023-10-02 LAB — MRSA NEXT GEN BY PCR, NASAL: MRSA by PCR Next Gen: NOT DETECTED

## 2023-10-02 MED ORDER — CARVEDILOL 6.25 MG PO TABS
6.2500 mg | ORAL_TABLET | Freq: Two times a day (BID) | ORAL | Status: DC
Start: 2023-10-02 — End: 2023-10-04
  Administered 2023-10-03 (×2): 6.25 mg via ORAL
  Filled 2023-10-02 (×3): qty 1

## 2023-10-02 MED ORDER — BUDESON-GLYCOPYRROL-FORMOTEROL 160-9-4.8 MCG/ACT IN AERO
2.0000 | INHALATION_SPRAY | Freq: Two times a day (BID) | RESPIRATORY_TRACT | Status: DC
  Administered 2023-10-02 – 2023-10-04 (×5): 2 via RESPIRATORY_TRACT
  Filled 2023-10-02: qty 5.9

## 2023-10-02 MED ORDER — POTASSIUM CHLORIDE CRYS ER 20 MEQ PO TBCR
40.0000 meq | EXTENDED_RELEASE_TABLET | Freq: Once | ORAL | Status: AC
  Administered 2023-10-02: 40 meq via ORAL
  Filled 2023-10-02: qty 2

## 2023-10-02 MED ORDER — ORAL CARE MOUTH RINSE: 15.0000 mL | OROMUCOSAL | Status: DC | PRN

## 2023-10-02 MED ORDER — CHLORHEXIDINE GLUCONATE CLOTH 2 % EX PADS
6.0000 | MEDICATED_PAD | Freq: Every day | CUTANEOUS | Status: DC
Start: 2023-10-02 — End: 2023-10-04
  Administered 2023-10-02 – 2023-10-04 (×3): 6 via TOPICAL

## 2023-10-02 MED ORDER — ENSURE PLUS HIGH PROTEIN PO LIQD
237.0000 mL | Freq: Two times a day (BID) | ORAL | Status: DC
  Administered 2023-10-02 – 2023-10-04 (×6): 237 mL via ORAL

## 2023-10-02 MED ORDER — CYCLOBENZAPRINE HCL 5 MG PO TABS
5.0000 mg | ORAL_TABLET | Freq: Three times a day (TID) | ORAL | Status: DC | PRN
  Administered 2023-10-03 (×2): 5 mg via ORAL
  Filled 2023-10-02 (×2): qty 1

## 2023-10-02 MED ORDER — PNEUMOCOCCAL 20-VAL CONJ VACC 0.5 ML IM SUSY
0.5000 mL | PREFILLED_SYRINGE | INTRAMUSCULAR | Status: AC
Start: 2023-10-03 — End: 2023-10-04
  Administered 2023-10-04: 0.5 mL via INTRAMUSCULAR
  Filled 2023-10-02: qty 0.5

## 2023-10-02 NOTE — Progress Notes (Signed)
   10/02/23 0320  BiPAP/CPAP/SIPAP  BiPAP/CPAP/SIPAP Pt Type Adult  BiPAP/CPAP/SIPAP V60  Mask Type Full face mask  Dentures removed? Not applicable  Mask Size Medium  Set Rate 24 breaths/min  Respiratory Rate 24 breaths/min  IPAP 16 cmH20  EPAP 8 cmH2O  FiO2 (%) 28 %  Minute Ventilation 14  Leak 7  Peak Inspiratory Pressure (PIP) 16  Tidal Volume (Vt) 575  Patient Home Machine No  Patient Home Mask No  Patient Home Tubing No  Auto Titrate No  Press High Alarm 40 cmH2O  Press Low Alarm 10 cmH2O  Oxygen Percent 40 %  BiPAP/CPAP /SiPAP Vitals  Pulse Rate 84  Resp (!) 22  SpO2 96 %  MEWS Score/Color  MEWS Score 1  MEWS Score Color Green

## 2023-10-02 NOTE — Progress Notes (Signed)
  Echocardiogram 2D Echocardiogram has been performed.  QUADE RAMIREZ 10/02/2023, 3:38 PM

## 2023-10-02 NOTE — Progress Notes (Signed)
 PROGRESS NOTE  Frank Flowers FMW:[id number] DOB: 1960-04-02 DOA: 10/01/2023 PCP: Medicine, [facility]   LOS: 1 day   Brief narrative:  Frank Flowers is a 63 y.o. male with past medical history significant for drug-eluting stent to the RCA, congestive heart failure with an EF of 30 to 35% and moderate mitral regurg, tobacco abuse presented to hospital with shortness of breath and difficulty breathing, wheezing and was noted to be in respiratory distress.  Patient was given IV magnesium  and IV steroids and nebulizer treatments in the ED.  Improved BNP was more than 2000.  Also received IV Lasix  and Ativan .  Patient was also seen by ICU team and was considered okay for medicine transfer service to admit.  Patient was then admitted to the hospital for further evaluation and treatment.   Assessment/Plan: Principal Problem:   Acute respiratory distress Active Problems:   CAD (coronary artery disease)   Ischemic cardiomyopathy   Hypertension   CHF (congestive heart failure) (HCC)  Acute respiratory distress requiring BiPAP likely secondary to COPD and acute on chronic systolic CHF exacerbation. History of chronic smoking.  .  Continue IV diuretics, nebulizers bronchodilators steroids.  Patient is on spironolactone  Entresto  and Coreg  at home.  Currently on Lasix , spironolactone  and Coreg ..  Chest x-ray without infiltrate..  Check 2D echocardiogram.  Review of previous 2D echocardiogram from 01/22/2020 showed LV ejection fraction of 35 to 40%.  Currently on 2 L of oxygen by  nasal cannula.  Required BiPAP initially.  PCCM on board and recommend good diuresis.  PCCM to set up an appointment for outpatient PFTs.  Continue intake and output charting, Daily weights low-salt diet.  Continue CHF protocol.  Currently on IV Lasix  60 mg daily.   Essential hypertension controlled.  Continue Coreg  Lasix  and spironolactone .    Hyperlipidemia - Continue statin   Tobacco abuse  - Smoking cessation  counseling done   History of coronary artery disease - Continue aspirin  and statin  DVT prophylaxis: enoxaparin  (LOVENOX ) injection 40 mg Start: 10/02/23 1000   Disposition: Home likely in 1 to 2 days  Status is: Inpatient Remains inpatient appropriate because: Pending clinical improvement, IV diuresis    Code Status:     Code Status: Full Code  Family Communication: None at bedside  Consultants: PCCM  Procedures: BiPAP  Anti-infectives:  None  Anti-infectives (From admission, onward)    None       Subjective: Today, patient was seen and examined at bedside.  Patient states that he feels a little better and is able to lie flat.  On supplemental oxygen.  Has been having some shortness of breath cough  Objective: Vitals:   10/02/23 1105 10/02/23 1107  BP:    Pulse:    Resp:    Temp:    SpO2: 96% 97%   No intake or output data in the 24 hours ending 10/02/23 1123 Filed Weights   10/01/23 1441  Weight: 127.9 kg   Body mass index is 36.21 kg/m.   Physical Exam:   GENERAL: Patient is alert awake and oriented. Not in obvious distress.  On nasal cannula oxygen HENT: No scleral pallor or icterus. Pupils equally reactive to light. Oral mucosa is moist NECK: is supple, no gross swelling noted. CHEST: Diminished breath sounds bilaterally with expiratory wheeze.   CVS: S1 and S2 heard, no murmur. Regular rate and rhythm.  ABDOMEN: Soft, non-tender, bowel sounds are present. EXTREMITIES: No edema. CNS: Cranial nerves are intact. No focal  motor deficits. SKIN: warm and dry without rashes.  Data Review: I have personally reviewed the following laboratory data and studies,  CBC: Recent Labs  Lab 10/01/23 1536 10/02/23 0645  WBC 6.5 5.9  NEUTROABS 4.3  --   HGB 15.2 15.9  HCT 49.0 51.1  MCV 90.9 90.6  PLT 192 218   Basic Metabolic Panel: Recent Labs  Lab 10/01/23 1536 10/02/23 0645  NA 138 138  K 3.5 3.9  CL 101 101  CO2 22 23  GLUCOSE 120*  127*  BUN 20 22  CREATININE 1.12 1.02  CALCIUM  9.0 9.2  MG  --  2.4   Liver Function Tests: Recent Labs  Lab 10/01/23 1536  AST 24  ALT 12  ALKPHOS 60  BILITOT 1.3*  PROT 7.4  ALBUMIN 3.5   No results for input(s): LIPASE, AMYLASE in the last 168 hours. No results for input(s): AMMONIA in the last 168 hours. Cardiac Enzymes: No results for input(s): CKTOTAL, CKMB, CKMBINDEX, TROPONINI in the last 168 hours. BNP (last 3 results) Recent Labs    12/30/22 1721 02/24/23 2257 04/11/23 1305  BNP 212.7* 102.4* 272.2*    ProBNP (last 3 results) Recent Labs    07/22/23 0905 10/01/23 1555 10/02/23 0645  PROBNP 497* 2,261.0* 1,437.0*    CBG: No results for input(s): GLUCAP in the last 168 hours. No results found for this or any previous visit (from the past 240 hours).   Studies: DG Chest Port 1 View Result Date: 10/01/2023 CLINICAL DATA:  Increasing shortness of breath. EXAM: PORTABLE CHEST 1 VIEW COMPARISON:  10/01/2023. FINDINGS: The heart is enlarged and the mediastinal contour stable. There is atherosclerotic calcification of the aorta. Lung volumes are low. No consolidation, effusion, or pneumothorax is seen. No acute osseous abnormality. IMPRESSION: 1. No active disease. 2. Cardiomegaly. Electronically Signed   By: Leita Birmingham M.D.   On: 10/01/2023 17:44   DG Chest Port 1 View Result Date: 10/01/2023 EXAM: 1 VIEW XRAY OF THE CHEST 10/01/2023 03:09:00 PM COMPARISON: 04/11/2023 CLINICAL HISTORY: sob. Pt reports shob the last 2 days, cannot lay down flat, coughing up fluid. Also having bad anxiety about difficulty breathing. FINDINGS: LUNGS AND PLEURA: No focal pulmonary opacity. No pulmonary edema. No pleural effusion. No pneumothorax. HEART AND MEDIASTINUM: Cardiomegaly. Calcified aorta. BONES AND SOFT TISSUES: No acute osseous abnormality. IMPRESSION: 1. No acute cardiopulmonary process. 2. Cardiomegaly. 3. Aortic atherosclerosis i. Electronically signed  by: Birmingham Calk MD 10/01/2023 03:24 PM EDT RP Workstation: [id number]      Vernal Alstrom, MD  [facility] 10/02/2023  If 7PM-7AM, please contact night-coverage

## 2023-10-02 NOTE — Hospital Course (Signed)
 Frank Flowers is a 63 y.o. male with past medical history significant for drug-eluting stent to the RCA, congestive heart failure with an EF of 30 to 35% and moderate mitral regurg, tobacco abuse presented to hospital with shortness of breath and difficulty breathing, wheezing and was noted to be in respiratory distress.  Patient was given IV magnesium  and IV steroids and nebulizer treatments in the ED.  Improved BNP was more than 2000.  Also received IV Lasix  and Ativan .  Patient was also seen by ICU team and was considered okay for medicine transfer service to admit.  Patient was then admitted to the hospital for further evaluation and treatment.  Acute respiratory distress requiring BiPAP likely secondary to COPD and acute on chronic systolic CHF exacerbation. Initially required BiPAP.  Continue IV diuretics, nebulizers bronchodilators steroids.  Patient is on spironolactone  Entresto  and Coreg  at home.  Currently on Lasix , spironolactone  and Coreg ..  Chest x-ray without infiltrate..  Check 2D echocardiogram.  Review of previous 2D echocardiogram from 01/22/2020 showed LV ejection fraction of 35 to 40%.  Currently on 2 L of oxygen by  nasal cannula.  Essential hypertension controlled.  Continue Coreg  Lasix  and spironolactone .    Hyperlipidemia - Continue statin   Tobacco abuse  - Smoking cessation counseling prior to discharge   History of coronary artery disease - Continue aspirin  and statin

## 2023-10-02 NOTE — Telephone Encounter (Signed)
 Patient does not have a pulmonologist. 100 PY smoking hx. Qualifies to get lung cancer screening, smoking cessation counseling, and evaluate for COPD.

## 2023-10-02 NOTE — Progress Notes (Signed)
   10/02/23 2259  BiPAP/CPAP/SIPAP  BiPAP/CPAP/SIPAP Pt Type Adult  BiPAP/CPAP/SIPAP V60  Mask Type Full face mask  Dentures removed? Not applicable  Mask Size Medium  Set Rate 24 breaths/min  Respiratory Rate 24 breaths/min  IPAP 16 cmH20  EPAP 8 cmH2O  FiO2 (%) 28 %  Minute Ventilation 12  Leak 26  Peak Inspiratory Pressure (PIP) 16  Tidal Volume (Vt) 490  Patient Home Machine No  Patient Home Mask No  Patient Home Tubing No  Auto Titrate No  Press High Alarm 40 cmH2O  Press Low Alarm 10 cmH2O  Device Plugged into RED Power Outlet Yes  Oxygen Percent 28 %

## 2023-10-02 NOTE — Progress Notes (Signed)
   10/02/23 0011  BiPAP/CPAP/SIPAP  $ Non-Invasive Ventilator  Non-Invasive Vent Subsequent  BiPAP/CPAP/SIPAP Pt Type Adult  BiPAP/CPAP/SIPAP V60  Mask Type Full face mask  Dentures removed? Not applicable  Mask Size Medium  Set Rate 24 breaths/min  Respiratory Rate 38 breaths/min  IPAP 16 cmH20  EPAP 8 cmH2O  FiO2 (%) 28 %  Minute Ventilation 21.2  Leak 23  Peak Inspiratory Pressure (PIP) 19  Tidal Volume (Vt) 511  Patient Home Machine No  Patient Home Mask No  Patient Home Tubing No  Auto Titrate No  Press High Alarm 40 cmH2O  Press Low Alarm 10 cmH2O  Device Plugged into RED Power Outlet Yes  Oxygen Percent 40 %  BiPAP/CPAP /SiPAP Vitals  Resp 18  MEWS Score/Color  MEWS Score 0  MEWS Score Color Green
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
## 2023-10-03 DIAGNOSIS — R0603 Acute respiratory distress: Secondary | ICD-10-CM | POA: Diagnosis not present

## 2023-10-03 MED ORDER — GABAPENTIN 300 MG PO CAPS
300.0000 mg | ORAL_CAPSULE | Freq: Three times a day (TID) | ORAL | Status: DC
  Administered 2023-10-03 – 2023-10-04 (×3): 300 mg via ORAL
  Filled 2023-10-03 (×3): qty 1

## 2023-10-03 MED ORDER — ENOXAPARIN SODIUM 60 MG/0.6ML IJ SOSY
60.0000 mg | PREFILLED_SYRINGE | INTRAMUSCULAR | Status: DC
  Administered 2023-10-04: 60 mg via SUBCUTANEOUS
  Filled 2023-10-03: qty 0.6

## 2023-10-03 MED ORDER — MELATONIN 3 MG PO TABS
3.0000 mg | ORAL_TABLET | Freq: Once | ORAL | Status: AC
Start: 2023-10-03 — End: 2023-10-03
  Administered 2023-10-03: 3 mg via ORAL
  Filled 2023-10-03: qty 1

## 2023-10-03 NOTE — Telephone Encounter (Signed)
 PT has been scheduled.

## 2023-10-03 NOTE — Telephone Encounter (Signed)
Unable to reach pt or leave a message mailbox is full 
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
## 2023-10-03 NOTE — Progress Notes (Signed)
 Brief PCCM Progress Note   Patient seen this am with no acute complaints, he was resting in bed on 2L [place]. No ongoing pulmonary needs. Continue to diurese as able. Telephone call already placed to establish patient within the clinic. Please call if additional needs arise.   Frank Cordova D. Harris, NP-C [facility] Personal contact information can be found on Amion  If no contact or response made please call [phone] 10/03/2023, 10:54 AM

## 2023-10-03 NOTE — Plan of Care (Signed)
   Problem: Education: Goal: Knowledge of General Education information will improve Description: Including pain rating scale, medication(s)/side effects and non-pharmacologic comfort measures Outcome: Progressing   Problem: Health Behavior/Discharge Planning: Goal: Ability to manage health-related needs will improve Outcome: Progressing   Problem: Clinical Measurements: Goal: Ability to maintain clinical measurements within normal limits will improve Outcome: Progressing Goal: Will remain free from infection Outcome: Progressing Goal: Cardiovascular complication will be avoided Outcome: Progressing   Problem: Activity: Goal: Risk for activity intolerance will decrease Outcome: Progressing   Problem: Nutrition: Goal: Adequate nutrition will be maintained Outcome: Progressing   Problem: Coping: Goal: Level of anxiety will decrease Outcome: Progressing

## 2023-10-03 NOTE — Progress Notes (Signed)
 PROGRESS NOTE  TAKERU BOSE FMW:[id number] DOB: [birth year] DOA: 10/01/2023 PCP: Medicine, [facility]   LOS: 2 days   Brief narrative:  Frank Flowers is a 63 y.o. male with past medical history significant for drug-eluting stent to the RCA, congestive heart failure with an EF of 30 to 35% and moderate mitral regurg, tobacco abuse presented to hospital with shortness of breath and difficulty breathing, wheezing and was noted to be in respiratory distress.  Patient was given IV magnesium  and IV steroids and nebulizer treatments in the ED.  Improved BNP was more than 2000.  Also received IV Lasix  and Ativan .  Patient was also seen by ICU team and was then admitted to the hospital for further evaluation and treatment.   Assessment/Plan: Principal Problem:   Acute respiratory distress Active Problems:   CAD (coronary artery disease)   Ischemic cardiomyopathy   Hypertension   CHF (congestive heart failure) (HCC)  Acute respiratory distress requiring BiPAP likely secondary to COPD and acute on chronic systolic CHF exacerbation. History of chronic smoking.   Continue IV diuretics, nebulizers bronchodilators steroids.  Patient is on spironolactone  Entresto  and Coreg  at home.  Currently on Lasix , spironolactone  and Coreg ..  Chest x-ray without infiltrate.. Review of previous 2D echocardiogram from 01/22/2020 showed LV ejection fraction of 35 to 40%.  Repeat 2D echocardiogram with LV ejection fraction of 35 to 40%, currently on 2 L of oxygen by  nasal cannula.  Required BiPAP initially.  PCCM on board and recommend good diuretics.  PCCM to set up an appointment for outpatient PFTs.  Continue intake and output charting, Daily weights low-salt diet.  Continue CHF protocol.  Currently on IV Lasix  60 mg daily.  Will continue for now.  Essential hypertension controlled.  Continue Coreg  Lasix  and spironolactone .  Latest blood pressure of 109/75    Hyperlipidemia - Continue statin   Tobacco  abuse  - Smoking cessation counseling done   History of coronary artery disease - Continue aspirin  and statin  DVT prophylaxis: Lovenox  subcu   Disposition: Home likely on 10/04/2023  Status is: Inpatient Remains inpatient appropriate because: Pending clinical improvement, IV diuresis    Code Status:     Code Status: Full Code  Family Communication: None at bedside  Consultants: PCCM  Procedures: BiPAP  Anti-infectives:  None  Anti-infectives (From admission, onward)    None       Subjective: Today, patient was seen and examined at bedside.  Patient states that he feels better with breathing.  He still has some cough shortness of breath and dyspnea.  Less wheezes.  Objective: Vitals:   10/03/23 1000 10/03/23 1100  BP: 113/72 109/75  Pulse: 74 78  Resp: (!) 30 19  Temp:    SpO2: 95% 94%    Intake/Output Summary (Last 24 hours) at 10/03/2023 1221 Last data filed at 10/03/2023 1100 Gross per 24 hour  Intake 840 ml  Output 1500 ml  Net -660 ml   Filed Weights   10/01/23 1441 10/03/23 0500  Weight: 127.9 kg 123.6 kg   Body mass index is 34.99 kg/m.   Physical Exam:  GENERAL: Patient is alert awake and oriented. Not in obvious distress.  On nasal cannula oxygen HENT: No scleral pallor or icterus. Pupils equally reactive to light. Oral mucosa is moist NECK: is supple, no gross swelling noted. CHEST: Decreased breath sounds bilaterally. CVS: S1 and S2 heard, no murmur. Regular rate and rhythm.  ABDOMEN: Soft, non-tender, bowel sounds are present.  EXTREMITIES: No edema. CNS: Cranial nerves are intact. No focal motor deficits. SKIN: warm and dry without rashes.  Data Review: I have personally reviewed the following laboratory data and studies,  CBC: Recent Labs  Lab 10/01/23 1536 10/02/23 0645  WBC 6.5 5.9  NEUTROABS 4.3  --   HGB 15.2 15.9  HCT 49.0 51.1  MCV 90.9 90.6  PLT 192 218   Basic Metabolic Panel: Recent Labs  Lab 10/01/23 1536  10/02/23 0645  NA 138 138  K 3.5 3.9  CL 101 101  CO2 22 23  GLUCOSE 120* 127*  BUN 20 22  CREATININE 1.12 1.02  CALCIUM  9.0 9.2  MG  --  2.4   Liver Function Tests: Recent Labs  Lab 10/01/23 1536  AST 24  ALT 12  ALKPHOS 60  BILITOT 1.3*  PROT 7.4  ALBUMIN 3.5   No results for input(s): LIPASE, AMYLASE in the last 168 hours. No results for input(s): AMMONIA in the last 168 hours. Cardiac Enzymes: No results for input(s): CKTOTAL, CKMB, CKMBINDEX, TROPONINI in the last 168 hours. BNP (last 3 results) Recent Labs    12/30/22 1721 02/24/23 2257 04/11/23 1305  BNP 212.7* 102.4* 272.2*    ProBNP (last 3 results) Recent Labs    07/22/23 0905 10/01/23 1555 10/02/23 0645  PROBNP 497* 2,261.0* 1,437.0*    CBG: No results for input(s): GLUCAP in the last 168 hours. Recent Results (from the past 240 hours)  MRSA Next Gen by PCR, Nasal     Status: None   Collection Time: 10/02/23 10:40 AM   Specimen: Nasal Mucosa; Nasal Swab  Result Value Ref Range Status   MRSA by PCR Next Gen NOT DETECTED NOT DETECTED Final    Comment: (NOTE) The GeneXpert MRSA Assay (FDA approved for NASAL specimens only), is one component of a comprehensive MRSA colonization surveillance program. It is not intended to diagnose MRSA infection nor to guide or monitor treatment for MRSA infections. Test performance is not FDA approved in patients less than 50 years old. Performed at [facility], [address], [place], [place] [address]      Studies: ECHOCARDIOGRAM COMPLETE Result Date: 10/02/2023    ECHOCARDIOGRAM REPORT   Patient Name:   Frank Flowers Date of Exam: 10/02/2023 Medical Rec #:  [id number]     Height:       74.0 in Accession #:    [id number]    Weight:       282.0 lb Date of Birth:  02/27/1960     BSA:          2.515 m Patient Age:    63 years      BP:           148/109 mmHg Patient Gender: M             HR:           89 bpm. Exam Location:   Inpatient Procedure: 2D Echo (Both Spectral and Color Flow Doppler were utilized during            procedure). Indications:    CHF  History:        Patient has prior history of Echocardiogram examinations. CHF                 and Cardiomyopathy, CAD; Risk Factors:Hypertension.  Sonographer:    NORLEEN Amour Referring Phys: [phone] CLAUDIA CLAIBORNE IMPRESSIONS  1. Left ventricular ejection fraction, by estimation, is 35 to 40%. The  left ventricle has moderately decreased function. The left ventricle demonstrates regional wall motion abnormalities (see scoring diagram/findings for description). The left ventricular internal cavity size was moderately dilated. Indeterminate diastolic filling due to E-A fusion.  2. Right ventricular systolic function is normal. The right ventricular size is mildly enlarged. There is mildly elevated pulmonary artery systolic pressure. The estimated right ventricular systolic pressure is 36.4 mmHg.  3. Left atrial size was moderately dilated.  4. The mitral valve is abnormal. Moderate mitral valve regurgitation.  5. The aortic valve is tricuspid. Aortic valve regurgitation is not visualized. No aortic stenosis is present.  6. The inferior vena cava is normal in size with greater than 50% respiratory variability, suggesting right atrial pressure of 3 mmHg. Comparison(s): Prior images reviewed side by side. Mitral insufficiency is worse and pattern is consistent with ischemic mechanism. Conclusion(s)/Recommendation(s): Consioder TEE for better evaluation of eccentric mitral regurgitation. FINDINGS  Left Ventricle: Left ventricular ejection fraction, by estimation, is 35 to 40%. The left ventricle has moderately decreased function. The left ventricle demonstrates regional wall motion abnormalities. The left ventricular internal cavity size was moderately dilated. There is no left ventricular hypertrophy. Indeterminate diastolic filling due to E-A fusion.  LV Wall Scoring: The entire inferior  wall, mid inferoseptal segment, and basal inferoseptal segment are akinetic. The posterior wall is hypokinetic. The entire anterior wall, antero-lateral wall, entire anterior septum, apical lateral segment, and apex are normal. Right Ventricle: The right ventricular size is mildly enlarged. No increase in right ventricular wall thickness. Right ventricular systolic function is normal. There is mildly elevated pulmonary artery systolic pressure. The tricuspid regurgitant velocity is 2.89 m/s, and with an assumed right atrial pressure of 3 mmHg, the estimated right ventricular systolic pressure is 36.4 mmHg. Left Atrium: Left atrial size was moderately dilated. Right Atrium: Right atrial size was normal in size. Pericardium: There is no evidence of pericardial effusion. Mitral Valve: The mitral valve is abnormal. Moderate mitral valve regurgitation, with eccentric posteriorly directed jet. Tricuspid Valve: The tricuspid valve is normal in structure. Tricuspid valve regurgitation is mild. Aortic Valve: The aortic valve is tricuspid. Aortic valve regurgitation is not visualized. No aortic stenosis is present. Pulmonic Valve: The pulmonic valve was normal in structure. Pulmonic valve regurgitation is not visualized. Aorta: The aortic root and ascending aorta are structurally normal, with no evidence of dilitation. Venous: The inferior vena cava is normal in size with greater than 50% respiratory variability, suggesting right atrial pressure of 3 mmHg. IAS/Shunts: No atrial level shunt detected by color flow Doppler.  LEFT VENTRICLE PLAX 2D LVIDd:         6.10 cm      Diastology LVIDs:         4.70 cm      LV e' medial:    6.53 cm/s LV PW:         1.30 cm      LV E/e' medial:  15.6 LV IVS:        1.10 cm      LV e' lateral:   12.50 cm/s LVOT diam:     2.30 cm      LV E/e' lateral: 8.2 LV SV:         52 LV SV Index:   20 LVOT Area:     4.15 cm  LV Volumes (MOD) LV vol d, MOD A2C: 201.0 ml LV vol d, MOD A4C: 244.0 ml LV  vol s, MOD A2C: 148.0 ml LV vol s, MOD A4C:  161.0 ml LV SV MOD A2C:     53.0 ml LV SV MOD A4C:     244.0 ml LV SV MOD BP:      69.7 ml RIGHT VENTRICLE             IVC RV Basal diam:  4.90 cm     IVC diam: 1.20 cm RV S prime:     12.70 cm/s TAPSE (M-mode): 2.2 cm LEFT ATRIUM            Index        RIGHT ATRIUM           Index LA diam:      4.80 cm  1.91 cm/m   RA Area:     16.40 cm LA Vol (A2C): 76.8 ml  30.53 ml/m  RA Volume:   35.90 ml  14.27 ml/m LA Vol (A4C): 107.0 ml 42.54 ml/m  AORTIC VALVE             PULMONIC VALVE LVOT Vmax:   78.30 cm/s  PV Vmax:       0.87 m/s LVOT Vmean:  54.400 cm/s PV Peak grad:  3.0 mmHg LVOT VTI:    0.124 m  AORTA Ao Root diam: 3.20 cm Ao Asc diam:  3.10 cm MITRAL VALVE                  TRICUSPID VALVE MV Area (PHT): 5.34 cm       TR Peak grad:   33.4 mmHg MV Decel Time: 142 msec       TR Vmax:        289.00 cm/s MR Peak grad:    81.4 mmHg MR Mean grad:    54.0 mmHg    SHUNTS MR Vmax:         451.00 cm/s  Systemic VTI:  0.12 m MR Vmean:        345.0 cm/s   Systemic Diam: 2.30 cm MR PISA:         1.57 cm MR PISA Eff ROA: 13 mm MR PISA Radius:  0.50 cm MV E velocity: 102.00 cm/s MV A velocity: 29.10 cm/s MV E/A ratio:  3.51 Mihai Croitoru MD Electronically signed by Jerel Balding MD Signature Date/Time: 10/02/2023/3:36:53 PM    Final    DG Chest Port 1 View Result Date: 10/01/2023 CLINICAL DATA:  Increasing shortness of breath. EXAM: PORTABLE CHEST 1 VIEW COMPARISON:  10/01/2023. FINDINGS: The heart is enlarged and the mediastinal contour stable. There is atherosclerotic calcification of the aorta. Lung volumes are low. No consolidation, effusion, or pneumothorax is seen. No acute osseous abnormality. IMPRESSION: 1. No active disease. 2. Cardiomegaly. Electronically Signed   By: Leita Birmingham M.D.   On: 10/01/2023 17:44   DG Chest Port 1 View Result Date: 10/01/2023 EXAM: 1 VIEW XRAY OF THE CHEST 10/01/2023 03:09:00 PM COMPARISON: 04/11/2023 CLINICAL HISTORY: sob. Pt  reports shob the last 2 days, cannot lay down flat, coughing up fluid. Also having bad anxiety about difficulty breathing. FINDINGS: LUNGS AND PLEURA: No focal pulmonary opacity. No pulmonary edema. No pleural effusion. No pneumothorax. HEART AND MEDIASTINUM: Cardiomegaly. Calcified aorta. BONES AND SOFT TISSUES: No acute osseous abnormality. IMPRESSION: 1. No acute cardiopulmonary process. 2. Cardiomegaly. 3. Aortic atherosclerosis i. Electronically signed by: Birmingham Calk MD 10/01/2023 03:24 PM EDT RP Workstation: [id number]      Vernal Alstrom, MD  [facility] 10/03/2023  If 7PM-7AM, please contact night-coverage

## 2023-10-03 NOTE — Plan of Care (Signed)

## 2023-10-04 ENCOUNTER — Encounter (HOSPITAL_COMMUNITY): Payer: Self-pay | Admitting: Internal Medicine

## 2023-10-04 ENCOUNTER — Other Ambulatory Visit (HOSPITAL_COMMUNITY): Payer: Self-pay

## 2023-10-04 DIAGNOSIS — R0603 Acute respiratory distress: Secondary | ICD-10-CM | POA: Diagnosis not present

## 2023-10-04 LAB — BASIC METABOLIC PANEL WITH GFR
Anion gap: 11 (ref 5–15)
BUN: 34 mg/dL — ABNORMAL HIGH (ref 8–23)
CO2: 24 mmol/L (ref 22–32)
Calcium: 9 mg/dL (ref 8.9–10.3)
Chloride: 100 mmol/L (ref 98–111)
Creatinine, Ser: 1 mg/dL (ref 0.61–1.24)
GFR, Estimated: >60 mL/min (ref >60)
Glucose, Bld: 103 mg/dL — ABNORMAL HIGH (ref 70–99)
Potassium: 4.5 mmol/L (ref 3.5–5.1)
Sodium: 135 mmol/L (ref 135–145)

## 2023-10-04 LAB — CBC
HCT: 53 % — ABNORMAL HIGH (ref 39.0–52.0)
Hemoglobin: 16 g/dL (ref 13.0–17.0)
MCH: 28.1 pg (ref 26.0–34.0)
MCHC: 30.2 g/dL (ref 30.0–36.0)
MCV: 93.1 fL (ref 80.0–100.0)
Platelets: 209 K/uL (ref 150–400)
RBC: 5.69 MIL/uL (ref 4.22–5.81)
RDW: 17.2 % — ABNORMAL HIGH (ref 11.5–15.5)
WBC: 10.8 K/uL — ABNORMAL HIGH (ref 4.0–10.5)
nRBC: 0 % (ref 0.0–0.2)

## 2023-10-04 LAB — MAGNESIUM: Magnesium: 2.4 mg/dL (ref 1.7–2.4)

## 2023-10-04 MED ORDER — NICOTINE 21 MG/24HR TD PT24
21.0000 mg | MEDICATED_PATCH | Freq: Every day | TRANSDERMAL | 0 refills | Status: AC
  Filled 2023-10-04: qty 28, 28d supply, fill #0

## 2023-10-04 MED ORDER — ALBUTEROL SULFATE HFA 108 (90 BASE) MCG/ACT IN AERS
2.0000 | INHALATION_SPRAY | Freq: Four times a day (QID) | RESPIRATORY_TRACT | 2 refills | Status: AC | PRN
  Filled 2023-10-04: qty 18, 25d supply, fill #0

## 2023-10-04 MED ORDER — IPRATROPIUM-ALBUTEROL 0.5-2.5 (3) MG/3ML IN SOLN
3.0000 mL | Freq: Four times a day (QID) | RESPIRATORY_TRACT | 0 refills | Status: AC | PRN
  Filled 2023-10-04: qty 360, 30d supply, fill #0

## 2023-10-04 MED ORDER — NICOTINE 21 MG/24HR TD PT24
21.0000 mg | MEDICATED_PATCH | Freq: Every day | TRANSDERMAL | Status: DC
  Administered 2023-10-04: 21 mg via TRANSDERMAL
  Filled 2023-10-04: qty 1

## 2023-10-04 MED ORDER — ADVAIR HFA 230-21 MCG/ACT IN AERO
2.0000 | INHALATION_SPRAY | Freq: Two times a day (BID) | RESPIRATORY_TRACT | 2 refills | Status: AC
  Filled 2023-10-04: qty 12, 30d supply, fill #0

## 2023-10-04 MED ORDER — NICOTINE POLACRILEX 2 MG MT GUM
2.0000 mg | CHEWING_GUM | OROMUCOSAL | 0 refills | Status: AC | PRN
  Filled 2023-10-04: qty 110, 30d supply, fill #0

## 2023-10-04 MED ORDER — NICOTINE POLACRILEX 2 MG MT GUM: 2.0000 mg | CHEWING_GUM | OROMUCOSAL | Status: DC | PRN

## 2023-10-04 MED ORDER — SPIRONOLACTONE 25 MG PO TABS
12.5000 mg | ORAL_TABLET | Freq: Every day | ORAL | 2 refills | Status: DC
  Filled 2023-10-04: qty 30, 60d supply, fill #0

## 2023-10-04 MED ORDER — PREDNISONE 10 MG PO TABS
ORAL_TABLET | ORAL | 0 refills | Status: DC
  Filled 2023-10-04: qty 19, 7d supply, fill #0

## 2023-10-04 NOTE — Discharge Summary (Signed)
 Physician Discharge Summary  Frank Flowers FMW:[id number] DOB: 1961/01/04 DOA: 10/01/2023  PCP: [facility]  Admit date: 10/01/2023 Discharge date: 10/04/2023  Admitted From: Home  Discharge disposition: Home   Recommendations for Outpatient Follow-Up:   Follow up with your primary care provider in one week.  Check CBC, BMP, magnesium  in the next visit Follow-up with cardiology as has been scheduled by the clinic.   Discharge Diagnosis:   Principal Problem:   Acute respiratory distress Active Problems:   CAD (coronary artery disease)   Ischemic cardiomyopathy   Hypertension   CHF (congestive heart failure) (HCC)   Discharge Condition: Improved.  Diet recommendation: Low sodium, heart healthy.  Fluid restriction 1500 mL/day.  Wound care: None.  Code status: Full.   History of Present Illness:   Frank Flowers is a 63 y.o. male with past medical history significant for drug-eluting stent to the RCA, congestive heart failure with an EF of 30 to 35% and moderate mitral regurg, tobacco abuse presented to hospital with shortness of breath and difficulty breathing, wheezing and was noted to be in respiratory distress.  Patient was given IV magnesium  and IV steroids and nebulizer treatments in the ED.  Improved BNP was more than 2000.  Also received IV Lasix  and Ativan .  Patient was also seen by ICU team and was then admitted to the hospital for further evaluation and treatment.    Hospital Course:   Following conditions were addressed during hospitalization as listed below,  Acute respiratory distress requiring BiPAP likely secondary to COPD and acute on chronic systolic CHF exacerbation. Improved at this time.  History of chronic smoking.   During hospitalization patient received IV diuretics, nebulizers bronchodilators steroids and was initially on BiPAP.  Patient will continue prednisone  on discharge.  Patient is on spironolactone  Entresto  and Coreg   at home.    Chest x-ray without infiltrate.. Review of previous 2D echocardiogram from 01/22/2020 showed LV ejection fraction of 35 to 40%.  Repeat 2D echocardiogram with LV ejection fraction of 35 to 40%, PCCM for further patient and patient received a good diuretics.   PCCM to set up an appointment for outpatient PFTs.  At this time patient has significantly improved.  Will follow-up with cardiology that has been scheduled.  Patient stated that he did not have inhaler so this has been refilled.  Essential hypertension controlled.  Continue occasions from home.  Latest blood pressure of 107/63    Hyperlipidemia - Continue statin   Tobacco abuse  - Smoking cessation counseling done.  Nicotine  gums and patches prescribed.   History of coronary artery disease - Continue aspirin  and statin  Disposition.  At this time, patient is stable for disposition home with outpatient PCP and cardiology follow-up  Medical Consultants:   PCCM  Procedures:    BiPAP Subjective:   Today, patient was seen and examined at bedside.  Continues to feel better pedal able to lie down in bed.  Not on supplemental oxygen even on ambulation.  Has had good diuresis.  Had a prolonged discussion regarding quitting smoking and compliant with medication.  Discharge Exam:   Vitals:   10/04/23 1230 10/04/23 1300  BP:    Pulse:  78  Resp:  13  Temp: 97.7 F (36.5 C)   SpO2: 95% 91%   Vitals:   10/04/23 0902 10/04/23 1100 10/04/23 1230 10/04/23 1300  BP: 100/70 107/63    Pulse: 82 91  78  Resp: 18 18  13  Temp:   97.7 F (36.5 C)   TempSrc:   Oral   SpO2: 94% 90% 95% 91%  Weight:      Height:       Body mass index is 35.21 kg/m.  General: Alert awake, not in obvious distress, obese, on room air HENT: pupils equally reacting to light,  No scleral pallor or icterus noted. Oral mucosa is moist.  Chest: Diminished breath sounds bilaterally.  No overt wheezes. CVS: S1 &S2 heard. No murmur.  Regular rate and  rhythm. Abdomen: Soft, nontender, nondistended.  Bowel sounds are heard.   Extremities: No cyanosis, clubbing or edema.  Peripheral pulses are palpable. Psych: Alert, awake and oriented, normal mood CNS:  No cranial nerve deficits.  Moves all extremities. Skin: Warm and dry.  No rashes noted.  The results of significant diagnostics from this hospitalization (including imaging, microbiology, ancillary and laboratory) are listed below for reference.     Diagnostic Studies:   ECHOCARDIOGRAM COMPLETE Result Date: 10/02/2023    ECHOCARDIOGRAM REPORT   Patient Name:   Frank Flowers Date of Exam: 10/02/2023 Medical Rec #:  [id number]     Height:       74.0 in Accession #:    [id number]    Weight:       282.0 lb Date of Birth:  07-28-1960     BSA:          2.515 m Patient Age:    63 years      BP:           148/109 mmHg Patient Gender: M             HR:           89 bpm. Exam Location:  Inpatient Procedure: 2D Echo (Both Spectral and Color Flow Doppler were utilized during            procedure). Indications:    CHF  History:        Patient has prior history of Echocardiogram examinations. CHF                 and Cardiomyopathy, CAD; Risk Factors:Hypertension.  Sonographer:    NORLEEN Amour Referring Phys: [id number] CLAUDIA CLAIBORNE IMPRESSIONS  1. Left ventricular ejection fraction, by estimation, is 35 to 40%. The left ventricle has moderately decreased function. The left ventricle demonstrates regional wall motion abnormalities (see scoring diagram/findings for description). The left ventricular internal cavity size was moderately dilated. Indeterminate diastolic filling due to E-A fusion.  2. Right ventricular systolic function is normal. The right ventricular size is mildly enlarged. There is mildly elevated pulmonary artery systolic pressure. The estimated right ventricular systolic pressure is 36.4 mmHg.  3. Left atrial size was moderately dilated.  4. The mitral valve is abnormal. Moderate mitral valve  regurgitation.  5. The aortic valve is tricuspid. Aortic valve regurgitation is not visualized. No aortic stenosis is present.  6. The inferior vena cava is normal in size with greater than 50% respiratory variability, suggesting right atrial pressure of 3 mmHg. Comparison(s): Prior images reviewed side by side. Mitral insufficiency is worse and pattern is consistent with ischemic mechanism. Conclusion(s)/Recommendation(s): Consioder TEE for better evaluation of eccentric mitral regurgitation. FINDINGS  Left Ventricle: Left ventricular ejection fraction, by estimation, is 35 to 40%. The left ventricle has moderately decreased function. The left ventricle demonstrates regional wall motion abnormalities. The left ventricular internal cavity size was moderately dilated. There is no left ventricular hypertrophy. Indeterminate diastolic filling due  to E-A fusion.  LV Wall Scoring: The entire inferior wall, mid inferoseptal segment, and basal inferoseptal segment are akinetic. The posterior wall is hypokinetic. The entire anterior wall, antero-lateral wall, entire anterior septum, apical lateral segment, and apex are normal. Right Ventricle: The right ventricular size is mildly enlarged. No increase in right ventricular wall thickness. Right ventricular systolic function is normal. There is mildly elevated pulmonary artery systolic pressure. The tricuspid regurgitant velocity is 2.89 m/s, and with an assumed right atrial pressure of 3 mmHg, the estimated right ventricular systolic pressure is 36.4 mmHg. Left Atrium: Left atrial size was moderately dilated. Right Atrium: Right atrial size was normal in size. Pericardium: There is no evidence of pericardial effusion. Mitral Valve: The mitral valve is abnormal. Moderate mitral valve regurgitation, with eccentric posteriorly directed jet. Tricuspid Valve: The tricuspid valve is normal in structure. Tricuspid valve regurgitation is mild. Aortic Valve: The aortic valve is  tricuspid. Aortic valve regurgitation is not visualized. No aortic stenosis is present. Pulmonic Valve: The pulmonic valve was normal in structure. Pulmonic valve regurgitation is not visualized. Aorta: The aortic root and ascending aorta are structurally normal, with no evidence of dilitation. Venous: The inferior vena cava is normal in size with greater than 50% respiratory variability, suggesting right atrial pressure of 3 mmHg. IAS/Shunts: No atrial level shunt detected by color flow Doppler.  LEFT VENTRICLE PLAX 2D LVIDd:         6.10 cm      Diastology LVIDs:         4.70 cm      LV e' medial:    6.53 cm/s LV PW:         1.30 cm      LV E/e' medial:  15.6 LV IVS:        1.10 cm      LV e' lateral:   12.50 cm/s LVOT diam:     2.30 cm      LV E/e' lateral: 8.2 LV SV:         52 LV SV Index:   20 LVOT Area:     4.15 cm  LV Volumes (MOD) LV vol d, MOD A2C: 201.0 ml LV vol d, MOD A4C: 244.0 ml LV vol s, MOD A2C: 148.0 ml LV vol s, MOD A4C: 161.0 ml LV SV MOD A2C:     53.0 ml LV SV MOD A4C:     244.0 ml LV SV MOD BP:      69.7 ml RIGHT VENTRICLE             IVC RV Basal diam:  4.90 cm     IVC diam: 1.20 cm RV S prime:     12.70 cm/s TAPSE (M-mode): 2.2 cm LEFT ATRIUM            Index        RIGHT ATRIUM           Index LA diam:      4.80 cm  1.91 cm/m   RA Area:     16.40 cm LA Vol (A2C): 76.8 ml  30.53 ml/m  RA Volume:   35.90 ml  14.27 ml/m LA Vol (A4C): 107.0 ml 42.54 ml/m  AORTIC VALVE             PULMONIC VALVE LVOT Vmax:   78.30 cm/s  PV Vmax:       0.87 m/s LVOT Vmean:  54.400 cm/s PV Peak grad:  3.0 mmHg LVOT VTI:  0.124 m  AORTA Ao Root diam: 3.20 cm Ao Asc diam:  3.10 cm MITRAL VALVE                  TRICUSPID VALVE MV Area (PHT): 5.34 cm       TR Peak grad:   33.4 mmHg MV Decel Time: 142 msec       TR Vmax:        289.00 cm/s MR Peak grad:    81.4 mmHg MR Mean grad:    54.0 mmHg    SHUNTS MR Vmax:         451.00 cm/s  Systemic VTI:  0.12 m MR Vmean:        345.0 cm/s   Systemic Diam: 2.30 cm MR  PISA:         1.57 cm MR PISA Eff ROA: 13 mm MR PISA Radius:  0.50 cm MV E velocity: 102.00 cm/s MV A velocity: 29.10 cm/s MV E/A ratio:  3.51 Mihai Croitoru MD Electronically signed by Jerel Balding MD Signature Date/Time: 10/02/2023/3:36:53 PM    Final    DG Chest Port 1 View Result Date: 10/01/2023 CLINICAL DATA:  Increasing shortness of breath. EXAM: PORTABLE CHEST 1 VIEW COMPARISON:  10/01/2023. FINDINGS: The heart is enlarged and the mediastinal contour stable. There is atherosclerotic calcification of the aorta. Lung volumes are low. No consolidation, effusion, or pneumothorax is seen. No acute osseous abnormality. IMPRESSION: 1. No active disease. 2. Cardiomegaly. Electronically Signed   By: Leita Birmingham M.D.   On: 10/01/2023 17:44   DG Chest Port 1 View Result Date: 10/01/2023 EXAM: 1 VIEW XRAY OF THE CHEST 10/01/2023 03:09:00 PM COMPARISON: 04/11/2023 CLINICAL HISTORY: sob. Pt reports shob the last 2 days, cannot lay down flat, coughing up fluid. Also having bad anxiety about difficulty breathing. FINDINGS: LUNGS AND PLEURA: No focal pulmonary opacity. No pulmonary edema. No pleural effusion. No pneumothorax. HEART AND MEDIASTINUM: Cardiomegaly. Calcified aorta. BONES AND SOFT TISSUES: No acute osseous abnormality. IMPRESSION: 1. No acute cardiopulmonary process. 2. Cardiomegaly. 3. Aortic atherosclerosis i. Electronically signed by: Birmingham Calk MD 10/01/2023 03:24 PM EDT RP Workstation: [id number]     Labs:   Basic Metabolic Panel: Recent Labs  Lab 10/01/23 1536 10/02/23 0645 10/04/23 0325  NA 138 138 135  K 3.5 3.9 4.5  CL 101 101 100  CO2 22 23 24   GLUCOSE 120* 127* 103*  BUN 20 22 34*  CREATININE 1.12 1.02 1.00  CALCIUM  9.0 9.2 9.0  MG  --  2.4 2.4   GFR Estimated Creatinine Clearance: 106 mL/min (by C-G formula based on SCr of 1 mg/dL). Liver Function Tests: Recent Labs  Lab 10/01/23 1536  AST 24  ALT 12  ALKPHOS 60  BILITOT 1.3*  PROT 7.4  ALBUMIN 3.5    No results for input(s): LIPASE, AMYLASE in the last 168 hours. No results for input(s): AMMONIA in the last 168 hours. Coagulation profile No results for input(s): INR, PROTIME in the last 168 hours.  CBC: Recent Labs  Lab 10/01/23 1536 10/02/23 0645 10/04/23 0325  WBC 6.5 5.9 10.8*  NEUTROABS 4.3  --   --   HGB 15.2 15.9 16.0  HCT 49.0 51.1 53.0*  MCV 90.9 90.6 93.1  PLT 192 218 209   Cardiac Enzymes: No results for input(s): CKTOTAL, CKMB, CKMBINDEX, TROPONINI in the last 168 hours. BNP: Invalid input(s): POCBNP CBG: No results for input(s): GLUCAP in the last 168 hours. D-Dimer Recent Labs  10/02/23 0821  DDIMER 1.48*   Hgb A1c No results for input(s): HGBA1C in the last 72 hours. Lipid Profile No results for input(s): CHOL, HDL, LDLCALC, TRIG, CHOLHDL, LDLDIRECT in the last 72 hours. Thyroid function studies Recent Labs    10/02/23 0645  TSH 0.303*   Anemia work up No results for input(s): VITAMINB12, FOLATE, FERRITIN, TIBC, IRON, RETICCTPCT in the last 72 hours. Microbiology Recent Results (from the past 240 hours)  MRSA Next Gen by PCR, Nasal     Status: None   Collection Time: 10/02/23 10:40 AM   Specimen: Nasal Mucosa; Nasal Swab  Result Value Ref Range Status   MRSA by PCR Next Gen NOT DETECTED NOT DETECTED Final    Comment: (NOTE) The GeneXpert MRSA Assay (FDA approved for NASAL specimens only), is one component of a comprehensive MRSA colonization surveillance program. It is not intended to diagnose MRSA infection nor to guide or monitor treatment for MRSA infections. Test performance is not FDA approved in patients less than 66 years old. Performed at [facility], [address], [place], [place] [address]      Discharge Instructions:   Discharge Instructions     (HEART FAILURE PATIENTS) Call MD:  Anytime you have any of the following symptoms: 1) 3 pound weight gain  in 24 hours or 5 pounds in 1 week 2) shortness of breath, with or without a dry hacking cough 3) swelling in the hands, feet or stomach 4) if you have to sleep on extra pillows at night in order to breathe.   Complete by: As directed    Diet - low sodium heart healthy   Complete by: As directed    Discharge instructions   Complete by: As directed    Follow-up with your primary care provider in 1 week.  Seek medical attention for worsening symptoms.  Check blood work at that time.  Please do not smoke.  Use nicotine  patch to help quit smoking.  Follow-up with your cardiology office as has been scheduled.   Heart Failure patients record your daily weight using the same scale at the same time of day   Complete by: As directed    Increase activity slowly   Complete by: As directed    Increase activity slowly   Complete by: As directed    STOP any activity that causes chest pain, shortness of breath, dizziness, sweating, or exessive weakness   Complete by: As directed       Allergies as of 10/04/2023       Reactions   Penicillins Other (See Comments)   Childhood allergy        Medication List     TAKE these medications    Advair  HFA 230-21 MCG/ACT inhaler Generic drug: fluticasone -salmeterol Inhale 2 puffs into the lungs 2 (two) times daily.   aspirin  81 MG chewable tablet Chew 1 tablet (81 mg total) by mouth 2 (two) times daily.   carvedilol  6.25 MG tablet Commonly known as: COREG  Take 1 tablet (6.25 mg total) by mouth 2 (two) times daily.   doxycycline 100 MG capsule Commonly known as: VIBRAMYCIN Take 100 mg by mouth 2 (two) times daily.   Entresto  24-26 MG Generic drug: sacubitril -valsartan  Take 1 tablet by mouth daily.   furosemide  20 MG tablet Commonly known as: LASIX  Take 3 tablets (60 mg total) by mouth daily.   gabapentin  300 MG capsule Commonly known as: NEURONTIN  Take 300 mg by mouth 3 (three) times daily.  ipratropium-albuterol  0.5-2.5 (3) MG/3ML  Soln Commonly known as: DUONEB Take 3 mLs by nebulization every 6 (six) hours as needed (shortness of breath, wheezing).   Jardiance  10 MG Tabs tablet Generic drug: empagliflozin  Take 1 tablet (10 mg total) by mouth daily before breakfast.   loratadine  10 MG tablet Commonly known as: CLARITIN  Take 10 mg by mouth daily as needed for allergies.   meloxicam  15 MG tablet Commonly known as: MOBIC  Take 15 mg by mouth daily.   metroNIDAZOLE 500 MG tablet Commonly known as: FLAGYL   nicotine  21 mg/24hr patch Commonly known as: NICODERM CQ  - dosed in mg/24 hours Place 1 patch (21 mg total) onto the skin daily.   nicotine  polacrilex 2 MG gum Commonly known as: NICORETTE  Take 1 each (2 mg total) by mouth as needed for smoking cessation.   predniSONE  10 MG tablet Commonly known as: DELTASONE  Take 4 tablets (40 mg) daily for 2 days, then, Take 3 tablets (30 mg) daily for 2 days, then, Take 2 tablets (20 mg) daily for 2 days, then, Take 1 tablets (10 mg) daily for 1 days, then stop   sertraline  100 MG tablet Commonly known as: ZOLOFT  Take 100 mg by mouth at bedtime.   sildenafil 100 MG tablet Commonly known as: VIAGRA Take 100 mg by mouth daily as needed for erectile dysfunction.   spironolactone  25 MG tablet Commonly known as: ALDACTONE  Take 0.5 tablets (12.5 mg total) by mouth daily.   Ventolin  HFA 108 (90 Base) MCG/ACT inhaler Generic drug: albuterol  Inhale 2 puffs into the lungs every 6 (six) hours as needed for wheezing or shortness of breath.        Follow-up Information     [facility]. Go in 9 day(s).   Specialty: Cardiology Why: Hospital follow up 10/14/2023 @ 9:45 am PLEASE bring a current medication list to appointment FREE valet Parking, Entrance C, off [employer] [employer] [address] entrance. Contact information: [address] [place]  [phone]         [facility] Follow up in 1 week(s).   Specialty: Family Medicine Contact information: [address] [place] [address] [phone]                  Time coordinating discharge: 39 minutes  Signed:  Calbert Hulsebus  [facility] 10/04/2023, 4:18 PM

## 2023-10-04 NOTE — Plan of Care (Signed)
  Problem: Clinical Measurements: Goal: Ability to maintain clinical measurements within normal limits will improve Outcome: Progressing   Problem: Clinical Measurements: Goal: Will remain free from infection Outcome: Progressing   Problem: Clinical Measurements: Goal: Diagnostic test results will improve Outcome: Progressing   Problem: Clinical Measurements: Goal: Respiratory complications will improve Outcome: Progressing   

## 2023-10-04 NOTE — Progress Notes (Signed)
 SATURATION QUALIFICATIONS: (This note is used to comply with regulatory documentation for home oxygen)  Patient Saturations on Room Air at Rest = 95%  Patient Saturations on Room Air while Ambulating = 93%  Patient Saturations on 0 Liters of oxygen while Ambulating  350 ft.= 92%  Please briefly explain why patient needs home oxygen:  He only needs O2 for sleeping.  His O2 saturation during sleeping goes down to 88% on RA.  He required 2L [place] to return for O2 saturations of 93%.

## 2023-10-04 NOTE — TOC Initial Note (Signed)
 Transition of Care [facility]) - Initial/Assessment Note    Patient Details  Name: Frank Flowers MRN: [id number] Date of Birth: 1960/11/10  [facility] CM/SW Contact:    Frank ONEIDA Anon, RN Phone Number: 10/04/2023, 9:58 AM  Clinical Narrative:                 NCM met with pt at bedside, introduced role in DC planning. Pt states he lives at home with mother of his child. Pt states at DC he will go back to current living situation PTA. NCM asked about current living situation and pt states it is not great and he and [name] are not compatible but he chooses to remain there for his 33 year old daughter. Asked if there is anywhere else he could reside and he confirms another [name] in his life but states he needs to go back to where he was before admission. NCM gave resources to [facility] [place], pt confirms understanding. NCM questioned about Substance use needs, with pt politely  declining. Pt denies any HH, DME, or any other SDOH needs. ICM will continue to follow for any new recommendations or DC needs.   Expected Discharge Plan: Home/Self Care Barriers to Discharge: Continued Medical Work up   Patient Goals and CMS Choice Patient states their goals for this hospitalization and ongoing recovery are:: To return home CMS Medicare.gov Compare Post Acute Care list provided to:: Patient Choice offered to / list presented to : Patient [place] ownership interest in [facility].provided to:: Patient    Expected Discharge Plan and Services In-house Referral: Clinical Social Work Discharge Planning Services: CM Consult Post Acute Care Choice: NA Living arrangements for the past 2 months: Single Family Home                 DME Arranged: N/A DME Agency: NA       HH Arranged: NA HH Agency: NA        Prior Living Arrangements/Services Living arrangements for the past 2 months: Single Family Home Lives with:: Significant Other Patient language and need for  interpreter reviewed:: Yes Do you feel safe going back to the place where you live?: No   Pt states he would rather go back to his current living situation because he needs to be there with his 32 year old daughter  Need for Family Participation in Patient Care: No (Comment) Care giver support system in place?: No (comment) Current home services: Other (comment) (NA) Criminal Activity/Legal Involvement Pertinent to Current Situation/Hospitalization: No - Comment as needed  Activities of Daily Living   ADL Screening (condition at time of admission) Independently performs ADLs?: Yes (appropriate for developmental age) Is the patient deaf or have difficulty hearing?: No Does the patient have difficulty seeing, even when wearing glasses/contacts?: No Does the patient have difficulty concentrating, remembering, or making decisions?: No  Permission Sought/Granted Permission sought to share information with : Case Manager, Family Supports Permission granted to share information with : Yes, Verbal Permission Granted  Share Information with NAME: Frank Flowers (Significant other)  [phone]           Emotional Assessment Appearance:: Appears stated age Attitude/Demeanor/Rapport: Engaged Affect (typically observed): Accepting Orientation: : Oriented to Self, Oriented to Place, Oriented to  Time, Oriented to Situation Alcohol  / Substance Use: Not Applicable Psych Involvement: No (comment)  Admission diagnosis:  Acute respiratory distress [R06.03] COPD exacerbation (HCC) [J44.1] Acute on chronic congestive heart failure, unspecified heart failure type (HCC) [  I50.9] Patient Active Problem List   Diagnosis Date Noted   Acute respiratory distress 10/01/2023   Hypertension 10/01/2023   Hyperlipemia 10/01/2023   Osteoarthritis of right hip 09/17/2020   Rectal bleeding 06/26/2020   Ischemic cardiomyopathy 08/21/2019   CHF (congestive heart failure) (HCC) 04/05/2019   SOB (shortness of  breath) 03/28/2019   Acute CHF (congestive heart failure) (HCC) 03/28/2019   Tobacco dependence 07/20/2018   Acute MI, inferior wall, initial episode of care (HCC) 01/31/2011   Morbid obesity (HCC)    CAD (coronary artery disease) 01/29/2011   PCP:  [facility] Pharm[facility]:   [id number] - [place], [place] - [address] [place] [address] Phone: [phone] Fax: [phone]     Social Drivers of Health (SDOH) Social History: SDOH Screenings   Food Insecurity: No Food Insecurity (10/02/2023)  Housing: Low Risk  (10/02/2023)  Transportation Needs: No Transportation Needs (10/02/2023)  Utilities: Not At Risk (10/02/2023)  Depression (PHQ2-9): Medium Risk (04/26/2023)  Financial Resource Strain: Not on File (04/30/2021)   Received from [facility]  Physical Activity: Not on File (04/30/2021)   Received from [facility]  Social Connections: Not on File (09/23/2022)   [facility] from [facility]  Stress: Not on File (04/30/2021)   Received from [facility]  Tobacco Use: High Risk (10/02/2023)   SDOH Interventions:     Readmission Risk Interventions    10/04/2023    9:51 AM  Readmission Risk Prevention Plan  Post Dischage Appt Complete  Medication Screening Complete  Transportation Screening Complete
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
## 2023-10-10 NOTE — Telephone Encounter (Signed)
Will discuss with patient at follow-up appointment.
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
## 2023-10-12 ENCOUNTER — Ambulatory Visit (INDEPENDENT_AMBULATORY_CARE_PROVIDER_SITE_OTHER)

## 2023-10-12 ENCOUNTER — Encounter (HOSPITAL_COMMUNITY): Payer: Self-pay

## 2023-10-12 ENCOUNTER — Ambulatory Visit (HOSPITAL_COMMUNITY): Attending: Cardiovascular Disease

## 2023-10-12 VITALS — BP 94/64 | HR 79 | Temp 97.6°F | Ht 74.0 in | Wt 269.4 lb

## 2023-10-12 DIAGNOSIS — R053 Chronic cough: Secondary | ICD-10-CM

## 2023-10-12 DIAGNOSIS — F17210 Nicotine dependence, cigarettes, uncomplicated: Secondary | ICD-10-CM | POA: Diagnosis not present

## 2023-10-12 DIAGNOSIS — F17200 Nicotine dependence, unspecified, uncomplicated: Secondary | ICD-10-CM

## 2023-10-12 DIAGNOSIS — J4489 Other specified chronic obstructive pulmonary disease: Secondary | ICD-10-CM | POA: Diagnosis not present

## 2023-10-12 MED ORDER — TIOTROPIUM BROMIDE MONOHYDRATE 18 MCG IN CAPS: 18.0000 ug | ORAL_CAPSULE | Freq: Every day | RESPIRATORY_TRACT | 6 refills | Status: AC

## 2023-10-12 MED ORDER — AEROCHAMBER MV MISC: 0 refills | Status: DC

## 2023-10-12 NOTE — Patient Instructions (Addendum)
 Dear Mr. Beverley;  Given your symptoms and smoking history, you have most likely COPD.  I agreed with the hospital plan: -Advair  2 puffs twice a day -I will start Spiriva, one puff  day.  -Albuterol  nebulizer to help with the secretions -Albuterol  inhaler as need it for short of breath. -Use the flutter device 3 times a day to help to move the secretions. I will see you in 4 months.  I am glad to hear you would like to quit smoking!, congratulations! I will refer you to the smoking cessation clinic to follow up closely. As we discussed, try to decrease the number of cigarettes a day over time and use the nicotine  patches and gum.   Also I enrolled you to the lung cancer screening program. I placed a referral and they will set up an appointment with you for yearly CT scan.   INHALER INSTRUCTIONS: To use the inhaler you follow these steps: Prime the inhaler according to instructions (which means waste between 1-4 doses before next use).  Follow package insert Shake the inhaler before each use Connect spacer Exhale completely by blowing all the air out of your lungs Seal your mouth around the spacer Press down on the canister then inhale SLOW and STEADY until your fill your lungs completely. Hold the breath for 6-10 seconds Gently exhale Wait 60 seconds then repeat steps 2-8. Rinse the mouth after each use to prevent thrush  Your pharmacist can also review proper technique if you have any remaining questions. Let me know if the cost is too high, your insurance may be able to recommend a more affordable option for you.
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
## 2023-10-13 ENCOUNTER — Telehealth (HOSPITAL_COMMUNITY): Payer: Self-pay

## 2023-10-13 NOTE — Telephone Encounter (Signed)
 Called to confirm/remind patient of their appointment at the [facility] on 10/14/23 9:45.   Appointment:   [] Confirmed  [] Left mess   [] No answer/No voice mail  [x] VM Full/unable to leave message  [] Phone not in service  Patient reminded to bring all medications and/or complete list.  Confirmed patient has transportation. Gave directions, instructed to utilize valet parking.
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
## 2023-10-14 ENCOUNTER — Encounter (HOSPITAL_COMMUNITY): Payer: Self-pay

## 2023-10-14 ENCOUNTER — Ambulatory Visit (HOSPITAL_COMMUNITY)
Admit: 2023-10-14 | Discharge: 2023-10-14 | Disposition: A | Discharge status: Home / Self Care | Source: Ambulatory Visit | Attending: Internal Medicine | Admitting: Internal Medicine

## 2023-10-14 ENCOUNTER — Ambulatory Visit (HOSPITAL_COMMUNITY): Payer: Self-pay | Admitting: Internal Medicine

## 2023-10-14 VITALS — BP 102/62 | HR 88 | Ht 74.0 in | Wt 272.2 lb

## 2023-10-14 DIAGNOSIS — Z7982 Long term (current) use of aspirin: Secondary | ICD-10-CM | POA: Insufficient documentation

## 2023-10-14 DIAGNOSIS — I2510 Atherosclerotic heart disease of native coronary artery without angina pectoris: Secondary | ICD-10-CM | POA: Diagnosis not present

## 2023-10-14 DIAGNOSIS — Z79899 Other long term (current) drug therapy: Secondary | ICD-10-CM | POA: Insufficient documentation

## 2023-10-14 DIAGNOSIS — I255 Ischemic cardiomyopathy: Secondary | ICD-10-CM

## 2023-10-14 DIAGNOSIS — Z8249 Family history of ischemic heart disease and other diseases of the circulatory system: Secondary | ICD-10-CM | POA: Diagnosis not present

## 2023-10-14 DIAGNOSIS — J449 Chronic obstructive pulmonary disease, unspecified: Secondary | ICD-10-CM | POA: Diagnosis not present

## 2023-10-14 DIAGNOSIS — F1010 Alcohol abuse, uncomplicated: Secondary | ICD-10-CM | POA: Insufficient documentation

## 2023-10-14 DIAGNOSIS — Z720 Tobacco use: Secondary | ICD-10-CM

## 2023-10-14 DIAGNOSIS — I10 Essential (primary) hypertension: Secondary | ICD-10-CM | POA: Diagnosis not present

## 2023-10-14 DIAGNOSIS — F17210 Nicotine dependence, cigarettes, uncomplicated: Secondary | ICD-10-CM | POA: Diagnosis not present

## 2023-10-14 DIAGNOSIS — I110 Hypertensive heart disease with heart failure: Secondary | ICD-10-CM | POA: Insufficient documentation

## 2023-10-14 DIAGNOSIS — Z7984 Long term (current) use of oral hypoglycemic drugs: Secondary | ICD-10-CM | POA: Diagnosis not present

## 2023-10-14 DIAGNOSIS — I5022 Chronic systolic (congestive) heart failure: Secondary | ICD-10-CM | POA: Insufficient documentation

## 2023-10-14 DIAGNOSIS — Z955 Presence of coronary angioplasty implant and graft: Secondary | ICD-10-CM | POA: Insufficient documentation

## 2023-10-14 LAB — BRAIN NATRIURETIC PEPTIDE: B Natriuretic Peptide: 96.3 pg/mL (ref 0.0–100.0)

## 2023-10-14 MED ORDER — CARVEDILOL 3.125 MG PO TABS: 3.1250 mg | ORAL_TABLET | Freq: Two times a day (BID) | ORAL | 3 refills | Status: DC

## 2023-10-14 MED ORDER — SPIRONOLACTONE 25 MG PO TABS: 12.5000 mg | ORAL_TABLET | Freq: Every day | ORAL | 2 refills | Status: DC

## 2023-10-14 NOTE — Progress Notes (Signed)
 ReDS Vest / Clip - 10/14/23 0958       ReDS Vest / Clip   Station Marker D    Ruler Value 40    ReDS Value Range Low volume    ReDS Actual Value 28

## 2023-10-14 NOTE — Patient Instructions (Signed)
 PLEASE MAKE SURE TO TAKE YOUR ENTRESTO  TWICE A DAY.  START Carvedilol  3.125 mg Twice daily  Labs done today, your results will be available in MyChart, we will contact you for abnormal readings.  Thank you for allowing us  to provider your heart failure care after your recent hospitalization. Please follow-up with Dr. Pietro as scheduled.  If you have any questions, issues, or concerns before your next appointment please call our office at [phone], opt. 2 and leave a message for the triage nurse.
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
## 2023-11-17 ENCOUNTER — Encounter

## 2023-11-18 ENCOUNTER — Ambulatory Visit: Attending: Cardiology | Admitting: Cardiology

## 2023-11-18 ENCOUNTER — Encounter: Payer: Self-pay | Admitting: Cardiology

## 2023-11-18 VITALS — BP 98/74 | HR 89 | Ht 74.0 in | Wt 276.0 lb

## 2023-11-18 DIAGNOSIS — E7800 Pure hypercholesterolemia, unspecified: Secondary | ICD-10-CM | POA: Diagnosis present

## 2023-11-18 DIAGNOSIS — I255 Ischemic cardiomyopathy: Secondary | ICD-10-CM | POA: Insufficient documentation

## 2023-11-18 DIAGNOSIS — I10 Essential (primary) hypertension: Secondary | ICD-10-CM | POA: Diagnosis present

## 2023-11-18 DIAGNOSIS — I5022 Chronic systolic (congestive) heart failure: Secondary | ICD-10-CM | POA: Insufficient documentation

## 2023-11-18 DIAGNOSIS — I2510 Atherosclerotic heart disease of native coronary artery without angina pectoris: Secondary | ICD-10-CM | POA: Diagnosis present

## 2023-11-18 MED ORDER — ROSUVASTATIN CALCIUM 40 MG PO TABS: 40.0000 mg | ORAL_TABLET | Freq: Every day | ORAL | 3 refills | Status: AC

## 2023-11-18 NOTE — Patient Instructions (Signed)
 Medication Instructions:   START ROSUVASTATIN  40 MG ONCE DAILY  *If you need a refill on your cardiac medications before your next appointment, please call your pharmacy*  Lab Work:  Your physician recommends that you return for lab work in: 8 [facility]  If you have labs (blood work) drawn today and your tests are completely normal, you will receive your results only by: MyChart Message (if you have MyChart) OR A paper copy in the mail If you have any lab test that is abnormal or we need to change your treatment, we will call you to review the results.  Testing/Procedures:  Your physician has requested that you have a cardiac MRI. Cardiac MRI uses a computer to create images of your heart as its beating, producing both still and moving pictures of your heart and major blood vessels. For further information please visit [email or web address]. Please follow the instruction sheet given to you today for more information.   Follow-Up: At [facility], you and your health needs are our priority.  As part of our continuing mission to provide you with exceptional heart care, our providers are all part of one team.  This team includes your primary Cardiologist (physician) and Advanced Practice Providers or APPs (Physician Assistants and Nurse Practitioners) who all work together to provide you with the care you need, when you need it.  Your next appointment:   3 month(s)  Provider:   Redell Shallow, MD    Other Instructions  You are scheduled for Cardiac MRI at the location below.  Please arrive for your appointment at ______________ . ?  [facility] [address] [place], [place] [address] Please take advantage of the free valet parking available at the [facility] and [employer] (Entrance C).  Proceed to the [facility] Radiology Department (First Floor) for check-in.    Magnetic resonance imaging (MRI) is a painless test that produces images of the inside of  the body without using Xrays.  During an MRI, strong magnets and radio waves work together in a data processing manager to form detailed images.   MRI images may provide more details about a medical condition than X-rays, CT scans, and ultrasounds can provide.  You may be given earphones to listen for instructions.  You may eat a light breakfast and take medications as ordered with the exception of furosemide , hydrochlorothiazide, chlorthalidone or spironolactone  (or any other fluid pill). If you are undergoing a stress MRI, please avoid stimulants for 12 hr prior to test. (I.e. Caffeine, nicotine , chocolate, or antihistamine medications)  If your provider has ordered anti-anxiety medications for this test, then you will need a driver.  An IV will be inserted into one of your veins. Contrast material will be injected into your IV. It will leave your body through your urine within a day. You may be told to drink plenty of fluids to help flush the contrast material out of your system.  You will be asked to remove all metal, including: Watch, jewelry, and other metal objects including hearing aids, hair pieces and dentures. Also wearable glucose monitoring systems (ie. Freestyle Libre and Omnipods) (Braces and fillings normally are not a problem.)   TEST WILL TAKE APPROXIMATELY 1 HOUR  PLEASE NOTIFY SCHEDULING AT LEAST 24 HOURS IN ADVANCE IF YOU ARE UNABLE TO KEEP YOUR APPOINTMENT. [phone]  For more information and frequently asked questions, please visit our website : [email or web address]  Please call the Cardiac Imaging Nurse Navigators with any questions/concerns. [phone] Office

## 2023-11-21 ENCOUNTER — Other Ambulatory Visit: Payer: Self-pay

## 2023-11-21 ENCOUNTER — Emergency Department (HOSPITAL_COMMUNITY)

## 2023-11-21 ENCOUNTER — Inpatient Hospital Stay (HOSPITAL_COMMUNITY)
Admission: EM | Admit: 2023-11-21 | Discharge: 2023-11-25 | DRG: 291 | Disposition: A | Discharge status: Home / Self Care | Attending: Internal Medicine | Admitting: Internal Medicine

## 2023-11-21 DIAGNOSIS — Z7982 Long term (current) use of aspirin: Secondary | ICD-10-CM | POA: Diagnosis not present

## 2023-11-21 DIAGNOSIS — F17200 Nicotine dependence, unspecified, uncomplicated: Secondary | ICD-10-CM | POA: Diagnosis not present

## 2023-11-21 DIAGNOSIS — I509 Heart failure, unspecified: Secondary | ICD-10-CM

## 2023-11-21 DIAGNOSIS — I252 Old myocardial infarction: Secondary | ICD-10-CM

## 2023-11-21 DIAGNOSIS — I110 Hypertensive heart disease with heart failure: Secondary | ICD-10-CM | POA: Diagnosis present

## 2023-11-21 DIAGNOSIS — Z7951 Long term (current) use of inhaled steroids: Secondary | ICD-10-CM

## 2023-11-21 DIAGNOSIS — J441 Chronic obstructive pulmonary disease with (acute) exacerbation: Principal | ICD-10-CM | POA: Diagnosis present

## 2023-11-21 DIAGNOSIS — Z8249 Family history of ischemic heart disease and other diseases of the circulatory system: Secondary | ICD-10-CM

## 2023-11-21 DIAGNOSIS — Z955 Presence of coronary angioplasty implant and graft: Secondary | ICD-10-CM

## 2023-11-21 DIAGNOSIS — Z7984 Long term (current) use of oral hypoglycemic drugs: Secondary | ICD-10-CM | POA: Diagnosis not present

## 2023-11-21 DIAGNOSIS — I4581 Long QT syndrome: Secondary | ICD-10-CM | POA: Diagnosis present

## 2023-11-21 DIAGNOSIS — Z791 Long term (current) use of non-steroidal anti-inflammatories (NSAID): Secondary | ICD-10-CM | POA: Diagnosis not present

## 2023-11-21 DIAGNOSIS — I255 Ischemic cardiomyopathy: Secondary | ICD-10-CM | POA: Diagnosis present

## 2023-11-21 DIAGNOSIS — Z96641 Presence of right artificial hip joint: Secondary | ICD-10-CM | POA: Diagnosis present

## 2023-11-21 DIAGNOSIS — Z6830 Body mass index (BMI) 30.0-30.9, adult: Secondary | ICD-10-CM | POA: Diagnosis not present

## 2023-11-21 DIAGNOSIS — E785 Hyperlipidemia, unspecified: Secondary | ICD-10-CM | POA: Diagnosis present

## 2023-11-21 DIAGNOSIS — I5023 Acute on chronic systolic (congestive) heart failure: Secondary | ICD-10-CM | POA: Diagnosis present

## 2023-11-21 DIAGNOSIS — Z1152 Encounter for screening for COVID-19: Secondary | ICD-10-CM

## 2023-11-21 DIAGNOSIS — E66812 Obesity, class 2: Secondary | ICD-10-CM

## 2023-11-21 DIAGNOSIS — Z79899 Other long term (current) drug therapy: Secondary | ICD-10-CM | POA: Diagnosis not present

## 2023-11-21 DIAGNOSIS — J9600 Acute respiratory failure, unspecified whether with hypoxia or hypercapnia: Secondary | ICD-10-CM | POA: Diagnosis present

## 2023-11-21 DIAGNOSIS — I2510 Atherosclerotic heart disease of native coronary artery without angina pectoris: Secondary | ICD-10-CM | POA: Diagnosis present

## 2023-11-21 DIAGNOSIS — E782 Mixed hyperlipidemia: Secondary | ICD-10-CM

## 2023-11-21 DIAGNOSIS — I10 Essential (primary) hypertension: Secondary | ICD-10-CM | POA: Diagnosis present

## 2023-11-21 DIAGNOSIS — J9601 Acute respiratory failure with hypoxia: Secondary | ICD-10-CM | POA: Diagnosis not present

## 2023-11-21 DIAGNOSIS — F17210 Nicotine dependence, cigarettes, uncomplicated: Secondary | ICD-10-CM | POA: Diagnosis present

## 2023-11-21 DIAGNOSIS — E66811 Obesity, class 1: Secondary | ICD-10-CM | POA: Diagnosis present

## 2023-11-21 LAB — COMPREHENSIVE METABOLIC PANEL WITH GFR
ALT: 13 U/L (ref 0–44)
AST: 33 U/L (ref 15–41)
Albumin: 3.4 g/dL — ABNORMAL LOW (ref 3.5–5.0)
Alkaline Phosphatase: 75 U/L (ref 38–126)
Anion gap: 12 (ref 5–15)
BUN: 14 mg/dL (ref 8–23)
CO2: 26 mmol/L (ref 22–32)
Calcium: 9.3 mg/dL (ref 8.9–10.3)
Chloride: 100 mmol/L (ref 98–111)
Creatinine, Ser: 0.93 mg/dL (ref 0.61–1.24)
GFR, Estimated: >60 mL/min (ref >60)
Glucose, Bld: 99 mg/dL (ref 70–99)
Potassium: 3.7 mmol/L (ref 3.5–5.1)
Sodium: 139 mmol/L (ref 135–145)
Total Bilirubin: 0.7 mg/dL (ref 0.0–1.2)
Total Protein: 8.4 g/dL — ABNORMAL HIGH (ref 6.5–8.1)

## 2023-11-21 LAB — RESP PANEL BY RT-PCR (RSV, FLU A&B, COVID)  RVPGX2
Influenza A by PCR: NEGATIVE
Influenza B by PCR: NEGATIVE
Resp Syncytial Virus by PCR: NEGATIVE
SARS Coronavirus 2 by RT PCR: NEGATIVE

## 2023-11-21 LAB — CBC
HCT: 51.7 % (ref 39.0–52.0)
Hemoglobin: 16.9 g/dL (ref 13.0–17.0)
MCH: 29.2 pg (ref 26.0–34.0)
MCHC: 32.7 g/dL (ref 30.0–36.0)
MCV: 89.4 fL (ref 80.0–100.0)
Platelets: 266 K/uL (ref 150–400)
RBC: 5.78 MIL/uL (ref 4.22–5.81)
RDW: 14.8 % (ref 11.5–15.5)
WBC: 9.7 K/uL (ref 4.0–10.5)
nRBC: 0 % (ref 0.0–0.2)

## 2023-11-21 LAB — PRO BRAIN NATRIURETIC PEPTIDE: Pro Brain Natriuretic Peptide: 1702 pg/mL — ABNORMAL HIGH (ref <300.0)

## 2023-11-21 MED ORDER — ACETAMINOPHEN 325 MG PO TABS: 650.0000 mg | ORAL_TABLET | Freq: Four times a day (QID) | ORAL | Status: DC | PRN

## 2023-11-21 MED ORDER — METHYLPREDNISOLONE SODIUM SUCC 125 MG IJ SOLR
125.0000 mg | Freq: Once | INTRAMUSCULAR | Status: AC
  Administered 2023-11-21: 125 mg via INTRAVENOUS
  Filled 2023-11-21: qty 2

## 2023-11-21 MED ORDER — ACETAMINOPHEN 650 MG RE SUPP: 650.0000 mg | Freq: Four times a day (QID) | RECTAL | Status: DC | PRN

## 2023-11-21 MED ORDER — ONDANSETRON HCL 4 MG PO TABS: 4.0000 mg | ORAL_TABLET | Freq: Four times a day (QID) | ORAL | Status: DC | PRN

## 2023-11-21 MED ORDER — BISACODYL 5 MG PO TBEC: 5.0000 mg | DELAYED_RELEASE_TABLET | Freq: Every day | ORAL | Status: DC | PRN

## 2023-11-21 MED ORDER — ENOXAPARIN SODIUM 40 MG/0.4ML IJ SOSY
40.0000 mg | PREFILLED_SYRINGE | INTRAMUSCULAR | Status: DC
  Administered 2023-11-22: 40 mg via SUBCUTANEOUS
  Filled 2023-11-21: qty 0.4

## 2023-11-21 MED ORDER — FUROSEMIDE 10 MG/ML IJ SOLN
60.0000 mg | Freq: Once | INTRAMUSCULAR | Status: AC
  Administered 2023-11-21: 60 mg via INTRAVENOUS
  Filled 2023-11-21: qty 8

## 2023-11-21 MED ORDER — CHLORHEXIDINE GLUCONATE CLOTH 2 % EX PADS
6.0000 | MEDICATED_PAD | Freq: Every day | CUTANEOUS | Status: DC
  Administered 2023-11-22 – 2023-11-23 (×2): 6 via TOPICAL

## 2023-11-21 MED ORDER — ONDANSETRON HCL 4 MG/2ML IJ SOLN: 4.0000 mg | Freq: Four times a day (QID) | INTRAMUSCULAR | Status: DC | PRN

## 2023-11-21 MED ORDER — IPRATROPIUM-ALBUTEROL 0.5-2.5 (3) MG/3ML IN SOLN
3.0000 mL | Freq: Once | RESPIRATORY_TRACT | Status: AC
  Administered 2023-11-21: 3 mL via RESPIRATORY_TRACT
  Filled 2023-11-21: qty 6

## 2023-11-21 MED ORDER — SENNOSIDES-DOCUSATE SODIUM 8.6-50 MG PO TABS: 1.0000 | ORAL_TABLET | Freq: Every evening | ORAL | Status: DC | PRN

## 2023-11-21 MED ORDER — IPRATROPIUM-ALBUTEROL 0.5-2.5 (3) MG/3ML IN SOLN
3.0000 mL | Freq: Once | RESPIRATORY_TRACT | Status: AC
  Administered 2023-11-21: 3 mL via RESPIRATORY_TRACT
  Filled 2023-11-21: qty 3

## 2023-11-21 NOTE — Progress Notes (Signed)
   11/21/23 2234  BiPAP/CPAP/SIPAP  BiPAP/CPAP/SIPAP Pt Type Adult  BiPAP/CPAP/SIPAP V60  Mask Type Full face mask  Dentures removed? Not applicable  Mask Size Large  Set Rate 18 breaths/min  Respiratory Rate 20 breaths/min  IPAP 12 cmH20  EPAP 5 cmH2O  FiO2 (%) 30 %  Leak 6  Peak Inspiratory Pressure (PIP) 12  Tidal Volume (Vt) 713  Patient Home Machine No  Patient Home Mask No  Patient Home Tubing No  Press High Alarm 30 cmH2O  Press Low Alarm 5 cmH2O  Device Plugged into RED Power Outlet Yes

## 2023-11-21 NOTE — ED Notes (Signed)
 Pt taken to xray

## 2023-11-21 NOTE — ED Provider Notes (Signed)
  EMERGENCY DEPARTMENT AT [facility] Provider Note   CSN: [id number] Arrival date & time: 11/21/23  2012     Patient presents with: Shortness of Breath   Frank Flowers is a 63 y.o. male.   Patient is a 63 year old male with a past medical history of CHF, COPD, CAD, hypertension presenting to the emergency department with shortness of breath.  Patient states that he has had worsening shortness of breath over the last few days.  He states he has had a cough productive of thick dark sputum.  He denies any fever or chest pain.  He denies any lower extremity swelling.  He states that he ran out of his Lasix  today but otherwise has been taking his medications as prescribed.  He states that he has felt like he has needed his inhaler more often recently.  The history is provided by the patient.  Shortness of Breath      Prior to Admission medications   Medication Sig Start Date End Date Taking? Authorizing Provider  ADVAIR  HFA 230-21 MCG/ACT inhaler Inhale 2 puffs into the lungs 2 (two) times daily. 10/04/23   Pokhrel, Vernal, MD  albuterol  (VENTOLIN  HFA) 108 (90 Base) MCG/ACT inhaler Inhale 2 puffs into the lungs every 6 (six) hours as needed for wheezing or shortness of breath. 10/04/23   Pokhrel, Vernal, MD  aspirin  81 MG chewable tablet Chew 1 tablet (81 mg total) by mouth 2 (two) times daily. 09/18/20   Swinteck, Redell, MD  carvedilol  (COREG ) 3.125 MG tablet Take 1 tablet (3.125 mg total) by mouth 2 (two) times daily. 10/14/23   Hayes Beckey CROME, NP  doxycycline (VIBRAMYCIN) 100 MG capsule Take 100 mg by mouth 2 (two) times daily. 09/20/23   [provider]  empagliflozin  (JARDIANCE ) 10 MG TABS tablet Take 1 tablet (10 mg total) by mouth daily before breakfast. 07/12/23   Pietro Redell RAMAN, MD  furosemide  (LASIX ) 20 MG tablet Take 3 tablets (60 mg total) by mouth daily. 07/12/23   Pietro Redell RAMAN, MD  gabapentin  (NEURONTIN ) 300 MG capsule Take 300 mg by mouth 3 (three)  times daily. 02/23/19   [provider]  ipratropium-albuterol  (DUONEB) 0.5-2.5 (3) MG/3ML SOLN Take 3 mLs by nebulization every 6 (six) hours as needed (shortness of breath, wheezing). 10/04/23   Pokhrel, Vernal, MD  loratadine  (CLARITIN ) 10 MG tablet Take 10 mg by mouth daily as needed for allergies.    [provider]  meloxicam  (MOBIC ) 15 MG tablet Take 15 mg by mouth daily. 02/23/19   [provider]  metroNIDAZOLE (FLAGYL) 500 MG tablet  09/22/23   [provider]  nicotine  (NICODERM CQ  - DOSED IN MG/24 HOURS) 21 mg/24hr patch Place 1 patch (21 mg total) onto the skin daily. 10/04/23   Pokhrel, Laxman, MD  nicotine  polacrilex (NICORETTE ) 2 MG gum Take 1 each (2 mg total) by mouth as needed for smoking cessation. 10/04/23   Pokhrel, Laxman, MD  predniSONE  (DELTASONE ) 10 MG tablet Take 4 tablets (40 mg) daily for 2 days, then, Take 3 tablets (30 mg) daily for 2 days, then, Take 2 tablets (20 mg) daily for 2 days, then, Take 1 tablets (10 mg) daily for 1 days, then stop Patient not taking: Reported on 11/18/2023 10/04/23   Sonjia Vernal, MD  rosuvastatin  (CRESTOR ) 40 MG tablet Take 1 tablet (40 mg total) by mouth daily. 11/18/23 02/16/24  Pietro Redell RAMAN, MD  sacubitril -valsartan  (ENTRESTO ) 24-26 MG Take 1 tablet by mouth daily.  [provider]  sertraline  (ZOLOFT ) 100 MG tablet Take 100 mg by mouth at bedtime.  07/25/19   [provider]  sildenafil (VIAGRA) 100 MG tablet Take 100 mg by mouth daily as needed for erectile dysfunction. 08/22/23   [provider]  Spacer/Aero-Holding Chambers (AEROCHAMBER MV) inhaler Use as instructed 10/12/23   Adrien Guan, Tamala, MD  spironolactone  (ALDACTONE ) 25 MG tablet Take 0.5 tablets (12.5 mg total) by mouth daily. 10/14/23   Hayes Beckey CROME, NP  tiotropium (SPIRIVA) 18 MCG inhalation capsule Place 1 capsule (18 mcg total) into inhaler and inhale daily. 1 puff a day daily 10/12/23   Adrien Guan, Tamala, MD    Allergies: Penicillins    Review of Systems  Respiratory:  Positive for shortness of breath.     Updated Vital Signs BP (!) 121/109   Pulse (!) 103   Temp 98.1 F (36.7 C) (Oral)   Resp (!) 32   SpO2 95%   Physical Exam Vitals and nursing note reviewed.  Constitutional:      General: He is not in acute distress.    Appearance: He is well-developed. He is ill-appearing.  HENT:     Head: Normocephalic.     Mouth/Throat:     Mouth: Mucous membranes are moist.     Pharynx: Oropharynx is clear.  Eyes:     Extraocular Movements: Extraocular movements intact.  Neck:     Vascular: No JVD.  Cardiovascular:     Rate and Rhythm: Regular rhythm. Tachycardia present.     Heart sounds: Normal heart sounds.  Pulmonary:     Effort: Tachypnea present. No accessory muscle usage.     Breath sounds: Wheezing (diffuse expiratory with bronchospastic cough) and rhonchi (diffuse) present.     Comments: Conversationally dyspneic speaking in 3-4 word sentences Abdominal:     Palpations: Abdomen is soft.     Tenderness: There is no abdominal tenderness.  Musculoskeletal:        General: Normal range of motion.     Cervical back: Normal range of motion and neck supple.     Right lower leg: No edema.     Left lower leg: No edema.  Skin:    General: Skin is warm and dry.  Neurological:     General: No focal deficit present.     Mental Status: He is alert and oriented to person, place, and time.  Psychiatric:        Mood and Affect: Mood normal.        Behavior: Behavior normal.     (all labs ordered are listed, but only abnormal results are displayed) Labs Reviewed  COMPREHENSIVE METABOLIC PANEL WITH GFR - Abnormal; Notable for the following components:      Result Value   Total Protein 8.4 (*)    Albumin 3.4 (*)    All other components within normal limits  PRO BRAIN NATRIURETIC PEPTIDE - Abnormal; Notable for the following components:   Pro Brain  Natriuretic Peptide 1,702.0 (*)    All other components within normal limits  RESP PANEL BY RT-PCR (RSV, FLU A&B, COVID)  RVPGX2  CBC    EKG: EKG Interpretation Date/Time:  Monday November 21 2023 20:21:33 EST Ventricular Rate:  109 PR Interval:  171 QRS Duration:  105 QT Interval:  359 QTC Calculation: 488 R Axis:   51  Text Interpretation: Sinus tachycardia LAE, consider biatrial enlargement LVH with secondary repolarization abnormality Anterior ST elevation, probably due to LVH Borderline prolonged  QT interval Baseline wander Otherwise no significant change Confirmed by Ellouise Fine ([id number]) on 11/21/2023 9:04:49 PM  Radiology: DG Chest 2 View Result Date: 11/21/2023 EXAM: 2 VIEW(S) XRAY OF THE CHEST 11/21/2023 09:07:42 PM COMPARISON: Chest x-ray 10/03/2023, CT chest 03/27/2019. CLINICAL HISTORY: SOB FINDINGS: LUNGS AND PLEURA: Chronic coarsened interstitial markings. No pleural effusion. No pneumothorax. HEART AND MEDIASTINUM: Unchanged cardiomediastinal silhouette with enlarged heart. BONES AND SOFT TISSUES: No acute osseous abnormality. IMPRESSION: 1. No acute cardiopulmonary process. 2. Cardiomegaly. Electronically signed by: Morgane Naveau MD 11/21/2023 09:19 PM EST RP Workstation: [id number]     .Critical Care  Performed by: Kingsley, Teddrick Mallari K, DO Authorized by: Ellouise Fine POUR, DO   Critical care provider statement:    Critical care time (minutes):  30   Critical care was necessary to treat or prevent imminent or life-threatening deterioration of the following conditions:  Respiratory failure   Critical care was time spent personally by me on the following activities:  Development of treatment plan with patient or surrogate, discussions with consultants, evaluation of patient's response to treatment, examination of patient, ordering and review of laboratory studies, ordering and review of radiographic studies, ordering and performing treatments and interventions,  pulse oximetry, re-evaluation of patient's condition and review of old charts    Medications Ordered in the ED  ipratropium-albuterol  (DUONEB) 0.5-2.5 (3) MG/3ML nebulizer solution 3 mL (3 mLs Nebulization Given 11/21/23 2045)  furosemide  (LASIX ) injection 60 mg (60 mg Intravenous Given 11/21/23 2149)  ipratropium-albuterol  (DUONEB) 0.5-2.5 (3) MG/3ML nebulizer solution 3 mL (3 mLs Nebulization Given 11/21/23 2147)  methylPREDNISolone  sodium succinate (SOLU-MEDROL ) 125 mg/2 mL injection 125 mg (125 mg Intravenous Given 11/21/23 2151)    Clinical Course as of 11/21/23 2228  Mon Nov 21, 2023  2226 Patient with continued tachypnea and coarse breath sounds after steroids and nebs. Will be started on bipap for work of breathing and plan for admission. [VK]    Clinical Course User Index [VK] Kingsley, Twania Bujak K, DO                                 Medical Decision Making This patient presents to the ED with chief complaint(s) of SOB with pertinent past medical history of CHF, COPD, CAD, HTN which further complicates the presenting complaint. The complaint involves an extensive differential diagnosis and also carries with it a high risk of complications and morbidity.    The differential diagnosis includes ACS, arrhythmia, anemia, pneumonia, pneumothorax, pulmonary edema, pleural effusion, COPD exacerbation, CHF exacerbation, viral syndrome  Additional history obtained: Additional history obtained from N/A Records reviewed previous admission documents and outpatient cardiology records  ED Course and Reassessment: On patient's arrival he is tachypneic tachycardic, conversationally dyspneic but otherwise not in acute respiratory distress.  He does have significant wheezing with a bronchospastic cough as well as coarse breath sounds.  He had labs, EKG and chest x-ray initiated in triage.  BNP is elevated to 1700 concerning for CHF exacerbation.  Patient will be given IV Lasix  in addition to  further DuoNebs and Solu-Medrol  and will be closely reassessed.  If he does not have improvement of respiratory status with medications, may need escalation to BiPAP.  Independent labs interpretation:  The following labs were independently interpreted: elevated BNP, otherwise within normal range  Independent visualization of imaging: - I independently visualized the following imaging with scope of interpretation limited to determining acute life threatening conditions related to emergency care:  CXR, which revealed no acute changes  Consultation: - Consulted or discussed management/test interpretation w/ external professional: hospitalist  Consideration for admission or further workup: patient requires admission for work of breathing in the setting of COPD and CHF Social Determinants of health: N/A    Amount and/or Complexity of Data Reviewed Labs: ordered. Radiology: ordered.  Risk Prescription drug management. Decision regarding hospitalization.       Final diagnoses:  COPD exacerbation (HCC)  Acute on chronic congestive heart failure, unspecified heart failure type [facility])    ED Discharge Orders     None          Kingsley, Anaria Kroner K, DO 11/21/23 2228

## 2023-11-21 NOTE — ED Notes (Signed)
 Patient transported to X-ray

## 2023-11-21 NOTE — ED Provider Triage Note (Signed)
 Emergency Medicine Provider Triage Evaluation Note  Frank Flowers , a 63 y.o. male  was evaluated in triage.  Pt complains of shortness of breath.  Patient states that he has been short of breath worse than normal for the past 3 to 4 days and has had a productive cough.  Appreciates history of CHF as well as PD exacerbations.  Patient has tried at home breathing treatments and nebulizers without relief.  Denies fever, chills, chest pain.  Review of Systems  Positive: Shortness of breath, productive cough Negative: Fever, chills, chest pain  Physical Exam  BP (!) 121/109   Pulse (!) 103   Temp 98.1 F (36.7 C) (Oral)   Resp (!) 32   SpO2 95%  Gen:   Awake, no distress   Resp:  Normal effort  MSK:   Moves extremities without difficulty  Other:  Patient placed on 2 L of nasal cannula oxygen by nursing, generalized wheezing present on auscultation  Medical Decision Making  Medically screening exam initiated at 8:42 PM.  Appropriate orders placed.  Frank Flowers was informed that the remainder of the evaluation will be completed by another provider, this initial triage assessment does not replace that evaluation, and the importance of remaining in the ED until their evaluation is complete.  Orders: CBC, CMP, respiratory panel, BNP, chest x-ray, EKG, breathing treatments   Frank Flowers, [place] 11/21/23 2044

## 2023-11-21 NOTE — ED Triage Notes (Addendum)
 Patient c/o SOB x 1 week. Patient report taking inhaler and nebs treatment at home with no relief. Bilateral wheezing in triage. Patient denies CP. 2LNC started in triage for comfort.
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
## 2023-11-22 DIAGNOSIS — J441 Chronic obstructive pulmonary disease with (acute) exacerbation: Principal | ICD-10-CM

## 2023-11-22 DIAGNOSIS — I5023 Acute on chronic systolic (congestive) heart failure: Secondary | ICD-10-CM

## 2023-11-22 DIAGNOSIS — J9601 Acute respiratory failure with hypoxia: Secondary | ICD-10-CM | POA: Diagnosis not present

## 2023-11-22 DIAGNOSIS — E66812 Obesity, class 2: Secondary | ICD-10-CM

## 2023-11-22 LAB — BASIC METABOLIC PANEL WITH GFR
Anion gap: 12 (ref 5–15)
BUN: 14 mg/dL (ref 8–23)
CO2: 29 mmol/L (ref 22–32)
Calcium: 9.1 mg/dL (ref 8.9–10.3)
Chloride: 100 mmol/L (ref 98–111)
Creatinine, Ser: 0.94 mg/dL (ref 0.61–1.24)
GFR, Estimated: >60 mL/min (ref >60)
Glucose, Bld: 141 mg/dL — ABNORMAL HIGH (ref 70–99)
Potassium: 3.6 mmol/L (ref 3.5–5.1)
Sodium: 140 mmol/L (ref 135–145)

## 2023-11-22 LAB — MAGNESIUM: Magnesium: 2.1 mg/dL (ref 1.7–2.4)

## 2023-11-22 LAB — CBC
HCT: 51.6 % (ref 39.0–52.0)
Hemoglobin: 16.8 g/dL (ref 13.0–17.0)
MCH: 29 pg (ref 26.0–34.0)
MCHC: 32.6 g/dL (ref 30.0–36.0)
MCV: 89 fL (ref 80.0–100.0)
Platelets: 265 K/uL (ref 150–400)
RBC: 5.8 MIL/uL (ref 4.22–5.81)
RDW: 14.9 % (ref 11.5–15.5)
WBC: 11.9 K/uL — ABNORMAL HIGH (ref 4.0–10.5)
nRBC: 0 % (ref 0.0–0.2)

## 2023-11-22 LAB — MRSA NEXT GEN BY PCR, NASAL: MRSA by PCR Next Gen: NOT DETECTED

## 2023-11-22 MED ORDER — ORAL CARE MOUTH RINSE
15.0000 mL | OROMUCOSAL | Status: DC
  Administered 2023-11-22 – 2023-11-24 (×4): 15 mL via OROMUCOSAL

## 2023-11-22 MED ORDER — AZITHROMYCIN 250 MG PO TABS
500.0000 mg | ORAL_TABLET | Freq: Every day | ORAL | Status: AC
  Administered 2023-11-22 – 2023-11-24 (×3): 500 mg via ORAL
  Filled 2023-11-22 (×3): qty 2

## 2023-11-22 MED ORDER — TIOTROPIUM BROMIDE MONOHYDRATE 18 MCG IN CAPS: 18.0000 ug | ORAL_CAPSULE | Freq: Every day | RESPIRATORY_TRACT | Status: DC

## 2023-11-22 MED ORDER — ARFORMOTEROL TARTRATE 15 MCG/2ML IN NEBU
15.0000 ug | INHALATION_SOLUTION | Freq: Two times a day (BID) | RESPIRATORY_TRACT | Status: DC
  Administered 2023-11-22 – 2023-11-25 (×7): 15 ug via RESPIRATORY_TRACT
  Filled 2023-11-22 (×7): qty 2

## 2023-11-22 MED ORDER — CHLORHEXIDINE GLUCONATE 0.12 % MT SOLN: 15.0000 mL | Freq: Once | OROMUCOSAL | Status: DC

## 2023-11-22 MED ORDER — FUROSEMIDE 10 MG/ML IJ SOLN
40.0000 mg | Freq: Every day | INTRAMUSCULAR | Status: DC
  Administered 2023-11-23 – 2023-11-25 (×3): 40 mg via INTRAVENOUS
  Filled 2023-11-22 (×3): qty 4

## 2023-11-22 MED ORDER — SACUBITRIL-VALSARTAN 24-26 MG PO TABS
1.0000 | ORAL_TABLET | Freq: Every day | ORAL | Status: DC
  Administered 2023-11-22 – 2023-11-25 (×4): 1 via ORAL
  Filled 2023-11-22 (×4): qty 1

## 2023-11-22 MED ORDER — GUAIFENESIN-DM 100-10 MG/5ML PO SYRP
5.0000 mL | ORAL_SOLUTION | ORAL | Status: DC | PRN
  Administered 2023-11-22 – 2023-11-24 (×7): 5 mL via ORAL
  Filled 2023-11-22 (×8): qty 10

## 2023-11-22 MED ORDER — ROSUVASTATIN CALCIUM 20 MG PO TABS
40.0000 mg | ORAL_TABLET | Freq: Every day | ORAL | Status: DC
  Administered 2023-11-22 – 2023-11-25 (×4): 40 mg via ORAL
  Filled 2023-11-22 (×4): qty 2

## 2023-11-22 MED ORDER — PREDNISONE 20 MG PO TABS: 40.0000 mg | ORAL_TABLET | Freq: Every day | ORAL | Status: DC

## 2023-11-22 MED ORDER — EMPAGLIFLOZIN 10 MG PO TABS
10.0000 mg | ORAL_TABLET | Freq: Every day | ORAL | Status: DC
  Administered 2023-11-22 – 2023-11-25 (×4): 10 mg via ORAL
  Filled 2023-11-22 (×6): qty 1

## 2023-11-22 MED ORDER — IPRATROPIUM-ALBUTEROL 0.5-2.5 (3) MG/3ML IN SOLN: 3.0000 mL | Freq: Four times a day (QID) | RESPIRATORY_TRACT | Status: DC | PRN

## 2023-11-22 MED ORDER — ORAL CARE MOUTH RINSE: 15.0000 mL | OROMUCOSAL | Status: DC | PRN

## 2023-11-22 MED ORDER — FUROSEMIDE 10 MG/ML IJ SOLN
40.0000 mg | Freq: Two times a day (BID) | INTRAMUSCULAR | Status: DC
  Administered 2023-11-22: 40 mg via INTRAVENOUS
  Filled 2023-11-22 (×2): qty 4

## 2023-11-22 MED ORDER — SPIRONOLACTONE 12.5 MG HALF TABLET
12.5000 mg | ORAL_TABLET | Freq: Every day | ORAL | Status: DC
  Administered 2023-11-22 – 2023-11-25 (×4): 12.5 mg via ORAL
  Filled 2023-11-22 (×4): qty 1

## 2023-11-22 MED ORDER — CARVEDILOL 3.125 MG PO TABS
3.1250 mg | ORAL_TABLET | Freq: Two times a day (BID) | ORAL | Status: DC
  Administered 2023-11-22 – 2023-11-25 (×7): 3.125 mg via ORAL
  Filled 2023-11-22 (×7): qty 1

## 2023-11-22 MED ORDER — UMECLIDINIUM BROMIDE 62.5 MCG/ACT IN AEPB
1.0000 | INHALATION_SPRAY | Freq: Every day | RESPIRATORY_TRACT | Status: DC
  Filled 2023-11-22: qty 7

## 2023-11-22 MED ORDER — BUDESONIDE 0.25 MG/2ML IN SUSP
0.2500 mg | Freq: Two times a day (BID) | RESPIRATORY_TRACT | Status: DC
  Administered 2023-11-22 – 2023-11-25 (×7): 0.25 mg via RESPIRATORY_TRACT
  Filled 2023-11-22 (×7): qty 2

## 2023-11-22 MED ORDER — NICOTINE 14 MG/24HR TD PT24
14.0000 mg | MEDICATED_PATCH | Freq: Every day | TRANSDERMAL | Status: DC
  Administered 2023-11-22 – 2023-11-25 (×4): 14 mg via TRANSDERMAL
  Filled 2023-11-22 (×4): qty 1

## 2023-11-22 MED ORDER — ENOXAPARIN SODIUM 60 MG/0.6ML IJ SOSY
60.0000 mg | PREFILLED_SYRINGE | INTRAMUSCULAR | Status: DC
  Administered 2023-11-23 – 2023-11-25 (×3): 60 mg via SUBCUTANEOUS
  Filled 2023-11-22 (×3): qty 0.6

## 2023-11-22 MED ORDER — ASPIRIN 81 MG PO CHEW
81.0000 mg | CHEWABLE_TABLET | Freq: Every day | ORAL | Status: DC
  Administered 2023-11-22 – 2023-11-25 (×4): 81 mg via ORAL
  Filled 2023-11-22 (×4): qty 1

## 2023-11-22 MED ORDER — REVEFENACIN 175 MCG/3ML IN SOLN
175.0000 ug | Freq: Every day | RESPIRATORY_TRACT | Status: DC
  Administered 2023-11-22 – 2023-11-25 (×4): 175 ug via RESPIRATORY_TRACT
  Filled 2023-11-22 (×4): qty 3

## 2023-11-22 MED ORDER — METHYLPREDNISOLONE SODIUM SUCC 125 MG IJ SOLR
80.0000 mg | Freq: Two times a day (BID) | INTRAMUSCULAR | Status: DC
  Administered 2023-11-22 – 2023-11-24 (×5): 80 mg via INTRAVENOUS
  Filled 2023-11-22 (×5): qty 2

## 2023-11-22 MED ORDER — FLUTICASONE FUROATE-VILANTEROL 200-25 MCG/ACT IN AEPB
1.0000 | INHALATION_SPRAY | Freq: Every day | RESPIRATORY_TRACT | Status: DC
  Filled 2023-11-22: qty 28

## 2023-11-22 NOTE — Hospital Course (Addendum)
 Frank Flowers is a 63 y.o. 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 Initial labs significant for proBNP 1700, otherwise unremarkable CMP, CBC and negative flu, RSV and COVID test. CXR shows cardiomegaly and chronic coarse interstitial markings but no active disease. Pt received IV Solu-Medrol , multiple DuoNebs and IV Lasix  60 mg x 1. TRH was consulted for admission.   Subjective: The patient reports he still has some symptoms of wheezing and coughing.  Does not report any chest pain.  Denies any fever or chills.  Does not report any significant sputum production.  Assessment and plan:  Acute respiratory failure-due to COPD and CHF exacerbation Acute COPD exacerbation Tobacco use disorder Presented with respiratory distress, needing BiPAP to ease work of breathing, due to COPD and CHF exacerbation. Now doing well on nasal cannula, continue to address underlying COPD CHF, tobacco cessation advised CXR with no evidence of PNA; Neg covid, RSV and flu test ContIV Solu-Medrol  80MG  Q12HR,azithromycin, budesonide , Brovana and Yupelri nebulizer ,w IS, prn nebs, Wean oxygen as tolerated to keep spo2 >92%-May need home oxygen.  Today he is still requiring 2 L of oxygen.  Continue to try to wean down.  Will decrease dosage of IV Solu-Medrol  today.   Acute on chronic systolic heart failure- EF 35-40% in sept, proBNP elevated to 1700  on Lasix  40 daily as tolerated GDMT:  spironolactone , Entresto , Coreg  and Jardiance  Cont to monitor daily I/O,weight, electrolytes and net balance as below. Wt improving Keep on  salt/fluid restricted diet and monitor in tele. Net IO Since Admission: -1,350 mL [11/24/23 1453]  Filed Weights   11/22/23 0200 11/23/23 0500  Weight: 121.2 kg 108 kg    Recent Labs  Lab 11/21/23 2010 11/22/23 0308 11/23/23 0751 11/24/23 0300  PROBNP 1,702.0*  --   --   --   BUN 14 14 25* 28*  CREATININE 0.93 0.94 0.95 0.96  K 3.7 3.6 4.1 4.2  MG  --  2.1  --   --     Prolonged Qtc EKG shows QTc of 488 on admission.Avoid QTc prolonging meds and trend electrolytes   CAD HLD: Hx of DES to RCA, stable w/o chest pain.Continue aspirin  and rosuvastatin    HTN Fairly stable 90s to 100,continue GDMT as above   Class I Obesity w/ Body mass index is 30.57 kg/m.: Will benefit with PCP follow-up, weight loss,healthy lifestyle and outpatient sleep eval if not done.  DVT prophylaxis: Lovenox  SQ Code Status:   Code Status: Full Code Family Communication: plan of care discussed with patient/none at bedside. Patient status is: Remains hospitalized because of severity of illness Level of care: Telemetry > transferred to telemetry  Dispo: The patient is from: home            Anticipated disposition: TBD Objective: Vitals last 24 hrs: Vitals:   11/24/23 1100 11/24/23 1200 11/24/23 1300 11/24/23 1400  BP:      Pulse: 81  77 73  Resp: ROLLEN)  26  (!) 26 17  Temp:  (!) 97 F (36.1 C)    TempSrc:  Axillary    SpO2: 96%  95% 96%  Weight:      Height:        Physical Examination: General exam: AAOX3 HEENT:Oral mucosa moist, Ear/Nose WNL grossly Respiratory system: Bilateral respiratory wheezing and rhonchi, Cardiovascular system: S1 & S2 +, No JVD. Gastrointestinal system: Abdomen soft,NT,ND, BS+ Nervous System: Alert, awake, moving all extremities,and following commands. Extremities: extremities warm, leg edema minimal Skin: Warm, no  rashes MSK: Normal muscle bulk,tone, power   Medications reviewed:  Scheduled Meds:  arformoterol  15 mcg Nebulization BID   aspirin   81 mg Oral Daily   budesonide  (PULMICORT ) nebulizer solution  0.25 mg Nebulization BID   carvedilol   3.125 mg Oral BID   Chlorhexidine  Gluconate Cloth  6 each Topical Daily   empagliflozin   10 mg Oral QAC breakfast   enoxaparin  (LOVENOX ) injection  60 mg Subcutaneous Q24H   furosemide   40 mg Intravenous Daily   methylPREDNISolone  (SOLU-MEDROL ) injection  40 mg Intravenous BID   nicotine   14 mg Transdermal Daily   revefenacin  175 mcg Nebulization Daily   rosuvastatin   40 mg Oral Daily   sacubitril -valsartan   1 tablet Oral Daily   spironolactone   12.5 mg Oral Daily   Continuous Infusions: Diet: Diet Order             Diet Heart Room service appropriate? Yes; Fluid consistency: Thin  Diet effective now

## 2023-11-22 NOTE — Progress Notes (Signed)
 Rt transported pt from ED to 1223 without complications.

## 2023-11-22 NOTE — ED Notes (Signed)
 Waiting for respiratory to assist with transporting pt to the floor.

## 2023-11-22 NOTE — Progress Notes (Signed)
   11/22/23 2230  BiPAP/CPAP/SIPAP  BiPAP/CPAP/SIPAP Pt Type Adult  BiPAP/CPAP/SIPAP V60  Mask Type Full face mask  Dentures removed? Not applicable  Mask Size Large  Set Rate 18 breaths/min  Respiratory Rate 24 breaths/min  IPAP 12 cmH20  EPAP 5 cmH2O  FiO2 (%) 30 %  Leak 6  Peak Inspiratory Pressure (PIP) 12  Tidal Volume (Vt) 692  Patient Home Machine No  Patient Home Mask No  Patient Home Tubing No  Auto Titrate No  Press High Alarm 30 cmH2O  Press Low Alarm 5 cmH2O  CPAP/SIPAP surface wiped down Yes  Device Plugged into RED Power Outlet Yes  BiPAP/CPAP /SiPAP Vitals  Pulse Rate 94  Resp (!) 24  SpO2 98 %  MEWS Score/Color  MEWS Score 1  MEWS Score Color Green

## 2023-11-22 NOTE — Progress Notes (Signed)
 PROGRESS NOTE Frank Flowers  FMW:[id number] DOB: 1960/08/31 DOA: 11/21/2023 PCP: Medicine, [facility]  Brief Narrative/Hospital Course: Frank Flowers is a 63 y.o. 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 Initial labs significant for proBNP 1700, otherwise unremarkable CMP, CBC and negative flu, RSV and COVID test. CXR shows cardiomegaly and chronic coarse interstitial markings but no active disease. Frank Flowers received IV Solu-Medrol , multiple DuoNebs and IV Lasix  60 mg x 1. TRH was consulted for admission.   Subjective: Seen and examined today Just came on BiPAP tachypneic, on 4 L nasal cannula Denies chest pain still having wheezing cough which makes the breathing worse Overall feels better Overnight on BiPAP with 30% FiO2, remained afebrile BP stable.  Labs reviewed CBC BMP stable  Assessment and plan:  Acute respiratory failure-due to COPD and CHF exacerbation Acute COPD exacerbation Tobacco use disorder Presented with respiratory distress, placed on BiPAP.  Continue to treat underlying COPD and CHF With ongoing tobacco use contributed to his symptoms CXR with no evidence of PNA; Neg covid, RSV and flu test Cont systemic steroids-added IV Solu-Medrol  80MG  Q12HR,Cont azithromycin Inhaler switched to budesonide , Brovana and Yupelri nebulizer Cont incentive spirometry, encouraged tobacco cessation.    Acute on chronic systolic heart  failure- EF 35-40% in sept, proBNP elevated to 1700 Cont IV lasix  40 mg twice daily GDMT: spironolactone , Entresto , Coreg  and Jardiance  Cont to monitor daily I/O,weight, electrolytes and net balance as below.Keep on  salt/fluid restricted diet and monitor in tele. Net IO Since Admission: -400 mL [11/22/23 0937]  Filed Weights   11/22/23 0200  Weight: 121.2 kg    Recent Labs  Lab 11/21/23 2010 11/22/23 0308  PROBNP 1,702.0*  --   BUN 14 14  CREATININE 0.93 0.94  K 3.7 3.6  MG  --  2.1    Prolonged Qtc EKG shows QTc of 488 on admission.Avoid QTc prolonging meds and trend electrolytes   CAD HLD: Status post drug-eluting stent to RCA, no current chest pain. Continue aspirin  and rosuvastatin    HTN Controlled continue GDMT as above   Class I Obesity w/ Body mass index is 34.31 kg/m.: Will benefit with PCP follow-up, weight loss,healthy lifestyle and outpatient sleep eval if not done.  DVT prophylaxis: enoxaparin  (LOVENOX ) injection 40 mg Start: 11/22/23 1000 Code Status:   Code Status: Full Code Family Communication: plan of care discussed with patient/none at bedside. Patient status is: Remains hospitalized because of severity of illness Level of care: Stepdown   Dispo: The patient is from: home            Anticipated disposition: TBD Objective: Vitals last 24 hrs: Vitals:   11/22/23 0800 11/22/23 0817 11/22/23 0904 11/22/23 0905  BP: (!) 121/92  (!) 123/98 (!) 123/98  Pulse: 77 83 96 97  Resp: 18 (!) 21 15   Temp: 97.9 F (36.6 C)     TempSrc: Axillary     SpO2: 99% 99% 100%  Weight:      Height:        Physical Examination: General exam: alert awake, oriented, older than stated age HEENT:Oral mucosa moist, Ear/Nose WNL grossly Respiratory system: Bilaterally diminished air entry present bilaterally diffuse wheezing,no use of accessory muscle Cardiovascular system: S1 & S2 +, No JVD. Gastrointestinal system: Abdomen soft,NT,ND, BS+ Nervous System: Alert,  awake, moving all extremities,and following commands. Extremities: extremities warm, leg edema minimal Skin: Warm, no rashes MSK: Normal muscle bulk,tone, power   Medications reviewed:  Scheduled Meds:  arformoterol  15 mcg Nebulization BID   aspirin   81 mg Oral Daily   azithromycin  500 mg Oral Daily   budesonide  (PULMICORT ) nebulizer solution  0.25 mg Nebulization BID   carvedilol   3.125 mg Oral BID   chlorhexidine   15 mL Mouth/Throat Once   Chlorhexidine  Gluconate Cloth  6 each Topical Daily   empagliflozin   10 mg Oral QAC breakfast   enoxaparin  (LOVENOX ) injection  40 mg Subcutaneous Q24H   furosemide   40 mg Intravenous BID   methylPREDNISolone  (SOLU-MEDROL ) injection  80 mg Intravenous BID   nicotine   14 mg Transdermal Daily   mouth rinse  15 mL Mouth Rinse 4 times per day   revefenacin  175 mcg Nebulization Daily   rosuvastatin   40 mg Oral Daily   sacubitril -valsartan   1 tablet Oral Daily   spironolactone   12.5 mg Oral Daily   Continuous Infusions: Diet: Diet Order             Diet Heart Room service appropriate? Yes; Fluid consistency: Thin  Diet effective now                   Data Reviewed: I have personally reviewed following labs and imaging studies ( see epic result tab) CBC: Recent Labs  Lab 11/21/23 2010 11/22/23 0308  WBC 9.7 11.9*  HGB 16.9 16.8  HCT 51.7 51.6  MCV 89.4 89.0  PLT 266 265   CMP: Recent Labs  Lab 11/21/23 2010 11/22/23 0308  NA 139 140  K 3.7 3.6  CL 100 100  CO2 26 29  GLUCOSE 99 141*  BUN 14 14  CREATININE 0.93 0.94  CALCIUM  9.3 9.1  MG  --  2.1   GFR: Estimated Creatinine Clearance: 111.3 mL/min (by C-G formula based on SCr of 0.94 mg/dL). Recent Labs  Lab 11/21/23 2010  AST 33  ALT 13  ALKPHOS 75  BILITOT 0.7  PROT 8.4*  ALBUMIN 3.4*   No results for input(s): LIPASE, AMYLASE in the last 168 hours. No results for input(s): AMMONIA in the last 168 hours. Coagulation Profile: No results for input(s):  INR, PROTIME in the last 168 hours. Unresulted Labs (From admission, onward)     Start     Ordered   11/21/23 2323  Expectorated Sputum Assessment w Gram Stain, Rflx to Resp Cult  (COPD / Pneumonia / Cellulitis / Lower Extremity Wound (Diabetic Foot Infection))  Once,   R        11/21/23 2324   11/21/23 2312  MRSA Next Gen by PCR, Nasal  Once,   R        11/21/23 2311           Antimicrobials/Microbiology: Anti-infectives (From admission, onward)    Start     Dose/Rate Route Frequency Ordered Stop   11/22/23 1000  azithromycin (ZITHROMAX) tablet 500 mg        500 mg Oral Daily 11/22/23 0123 11/25/23 0959  No results found for: SDES, SPECREQUEST, CULT, REPTSTATUS  Procedures:    Mennie LAMY, MD [facility] 11/22/2023, 9:37 AM

## 2023-11-23 DIAGNOSIS — J9601 Acute respiratory failure with hypoxia: Secondary | ICD-10-CM | POA: Diagnosis not present

## 2023-11-23 LAB — BASIC METABOLIC PANEL WITH GFR
Anion gap: 11 (ref 5–15)
BUN: 25 mg/dL — ABNORMAL HIGH (ref 8–23)
CO2: 27 mmol/L (ref 22–32)
Calcium: 9.4 mg/dL (ref 8.9–10.3)
Chloride: 100 mmol/L (ref 98–111)
Creatinine, Ser: 0.95 mg/dL (ref 0.61–1.24)
GFR, Estimated: >60 mL/min (ref >60)
Glucose, Bld: 150 mg/dL — ABNORMAL HIGH (ref 70–99)
Potassium: 4.1 mmol/L (ref 3.5–5.1)
Sodium: 137 mmol/L (ref 135–145)

## 2023-11-23 MED ORDER — GABAPENTIN 300 MG PO CAPS
300.0000 mg | ORAL_CAPSULE | Freq: Once | ORAL | Status: AC
  Administered 2023-11-23: 300 mg via ORAL
  Filled 2023-11-23: qty 1

## 2023-11-23 NOTE — Progress Notes (Signed)
 PROGRESS NOTE Frank Flowers  FMW:[id number] DOB: [birth year] DOA: 11/21/2023 PCP: Medicine, [facility]  Brief Narrative/Hospital Course: Frank Flowers is a 63 y.o. 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 Initial labs significant for proBNP 1700, otherwise unremarkable CMP, CBC and negative flu, RSV and COVID test. CXR shows cardiomegaly and chronic coarse interstitial markings but no active disease. Pt received IV Solu-Medrol , multiple DuoNebs and IV Lasix  60 mg x 1. TRH was consulted for admission.   Subjective: Seen and examined Overall feels much better Overnight afebrile remaining stable on 4 L [place]> weaned down to 3 L no more need for BiPAP Has been ambulatory  Assessment and plan:  Acute respiratory failure-due to COPD and CHF exacerbation Acute COPD exacerbation Tobacco use disorder Presented with respiratory distress, needing BiPAP to ease work of breathing, due to COPD and CHF exacerbation. Now doing well on nasal cannula, continue to address underlying COPD CHF, tobacco cessation advised CXR with no evidence of PNA; Neg covid, RSV and flu test ContIV Solu-Medrol  80MG  Q12HR,azithromycin, budesonide , Brovana and Yupelri nebulizer ,w IS, prn nebs, Wean oxygen as tolerated to keep spo2 >92%-May need home oxygen   Acute on chronic systolic heart failure- EF 35-40% in sept, proBNP elevated to 1700 BP soft will keep on Lasix  40 daily  as tolerated GDMT: spironolactone , Entresto , Coreg  and Jardiance  Cont to monitor daily I/O,weight, electrolytes and net balance as below. Wt improving Keep on  salt/fluid restricted diet and monitor in tele. Net IO Since Admission: -790 mL [11/23/23 1013]  Filed Weights   11/22/23 0200 11/23/23 0500  Weight: 121.2 kg 108 kg    Recent Labs  Lab 11/21/23 2010 11/22/23 0308 11/23/23 0751  PROBNP 1,702.0*  --   --   BUN 14 14 25*  CREATININE 0.93 0.94 0.95  K 3.7 3.6 4.1  MG  --  2.1  --     Prolonged Qtc EKG shows QTc of 488 on admission.Avoid QTc prolonging meds and trend electrolytes   CAD HLD: Hx of DES to RCA, stable w/o chest pain.Continue aspirin  and rosuvastatin    HTN Fairly stable 90s to 100,continue GDMT as above   Class I Obesity w/ Body mass index is 30.57 kg/m.: Will benefit with PCP follow-up, weight loss,healthy lifestyle and outpatient sleep eval if not done.  DVT prophylaxis: Lovenox  SQ Code Status:   Code Status: Full Code Family Communication: plan of care discussed with patient/none at bedside. Patient status is: Remains hospitalized because of severity of illness Level of care: Stepdown > transferred to telemetry  Dispo: The patient is from: home            Anticipated disposition: TBD Objective: Vitals last 24 hrs: Vitals:   11/23/23 0500 11/23/23 0600 11/23/23 0733 11/23/23 0801  BP:  96/76  119/81  Pulse: 76 84 87 80  Resp: (!) 25 17 16  (!) 21  Temp:    (!) 97.5 F (36.4 C)  TempSrc:    Oral  SpO2: 100% 96% 98% 98%  Weight: 108 kg     Height:        Physical Examination: General exam: AAOX3 HEENT:Oral mucosa moist, Ear/Nose WNL grossly Respiratory system: Bilateral diffuse wheezing +, diminished air entry no use of accessory muscle Cardiovascular system: S1 & S2 +, No JVD. Gastrointestinal system: Abdomen soft,NT,ND, BS+ Nervous System: Alert, awake, moving all extremities,and following commands. Extremities: extremities warm, leg  edema minimal Skin: Warm, no rashes MSK: Normal muscle bulk,tone, power   Medications reviewed:  Scheduled Meds:  arformoterol  15 mcg Nebulization BID   aspirin   81 mg Oral Daily   azithromycin  500 mg Oral Daily   budesonide  (PULMICORT ) nebulizer solution  0.25 mg Nebulization BID   carvedilol   3.125 mg Oral BID   chlorhexidine   15 mL Mouth/Throat Once   Chlorhexidine  Gluconate Cloth  6 each Topical Daily   empagliflozin   10 mg Oral QAC breakfast   enoxaparin  (LOVENOX ) injection  60 mg Subcutaneous Q24H   furosemide   40 mg Intravenous Daily   methylPREDNISolone  (SOLU-MEDROL ) injection  80 mg Intravenous BID   nicotine   14 mg Transdermal Daily   mouth rinse  15 mL Mouth Rinse 4 times per day   revefenacin  175 mcg Nebulization Daily   rosuvastatin   40 mg Oral Daily   sacubitril -valsartan   1 tablet Oral Daily   spironolactone   12.5 mg Oral Daily   Continuous Infusions: Diet: Diet Order             Diet Heart Room service appropriate? Yes; Fluid consistency: Thin  Diet effective now                   Data Reviewed: I have personally reviewed following labs and imaging studies ( see epic result tab) CBC: Recent Labs  Lab 11/21/23 2010 11/22/23 0308  WBC 9.7 11.9*  HGB 16.9 16.8  HCT 51.7 51.6  MCV 89.4 89.0  PLT 266 265   CMP: Recent Labs  Lab 11/21/23 2010 11/22/23 0308 11/23/23 0751  NA 139 140 137  K 3.7 3.6 4.1  CL 100 100 100  CO2 26 29 27   GLUCOSE 99 141* 150*  BUN 14 14 25*  CREATININE 0.93 0.94 0.95  CALCIUM  9.3 9.1 9.4  MG  --  2.1  --    GFR: Estimated Creatinine Clearance: 104.1 mL/min (by C-G formula based on SCr of 0.95 mg/dL). Recent Labs  Lab 11/21/23 2010  AST 33  ALT 13  ALKPHOS 75  BILITOT 0.7  PROT 8.4*  ALBUMIN 3.4*   No results for input(s): LIPASE, AMYLASE in the last 168 hours. No results for input(s): AMMONIA in the last 168 hours. Coagulation Profile: No results for input(s): INR, PROTIME in the last 168  hours. Unresulted Labs (From admission, onward)     Start     Ordered   11/24/23 0500  Basic metabolic panel with GFR  Daily,   R     Question:  Specimen collection method  Answer:  Lab=Lab collect   11/23/23 0718   11/24/23 0500  CBC  Daily,   R     Question:  Specimen collection method  Answer:  Lab=Lab collect   11/23/23 0718   11/21/23 2323  Expectorated Sputum Assessment w Gram Stain, Rflx to Resp Cult  (COPD / Pneumonia / Cellulitis / Lower Extremity Wound (Diabetic Foot Infection))  Once,   R        11/21/23  2324   11/21/23 2312  MRSA Next Gen by PCR, Nasal  Once,   R        11/21/23 2311           Antimicrobials/Microbiology: Anti-infectives (From admission, onward)    Start     Dose/Rate Route Frequency Ordered Stop   11/22/23 1000  azithromycin (ZITHROMAX) tablet 500 mg        500 mg Oral Daily 11/22/23 0123 11/25/23 0959      No results found for: SDES, SPECREQUEST, CULT, REPTSTATUS  Procedures:    Mennie LAMY, MD [facility] 11/23/2023, 10:13 AM

## 2023-11-23 NOTE — Plan of Care (Signed)
  Problem: Education: Goal: Ability to demonstrate management of disease process will improve Outcome: Progressing Goal: Ability to verbalize understanding of medication therapies will improve Outcome: Progressing Goal: Individualized Educational Video(s) Outcome: Progressing   Problem: Activity: Goal: Capacity to carry out activities will improve Outcome: Progressing   Problem: Cardiac: Goal: Ability to achieve and maintain adequate cardiopulmonary perfusion will improve Outcome: Progressing   Problem: Education: Goal: Knowledge of disease or condition will improve Outcome: Progressing Goal: Knowledge of the prescribed therapeutic regimen will improve Outcome: Progressing Goal: Individualized Educational Video(s) Outcome: Progressing   Problem: Activity: Goal: Ability to tolerate increased activity will improve Outcome: Progressing Goal: Will verbalize the importance of balancing activity with adequate rest periods Outcome: Progressing   Problem: Respiratory: Goal: Ability to maintain a clear airway will improve Outcome: Progressing Goal: Levels of oxygenation will improve Outcome: Progressing Goal: Ability to maintain adequate ventilation will improve Outcome: Progressing   Problem: Education: Goal: Knowledge of General Education information will improve Description: Including pain rating scale, medication(s)/side effects and non-pharmacologic comfort measures Outcome: Progressing   Problem: Health Behavior/Discharge Planning: Goal: Ability to manage health-related needs will improve Outcome: Progressing   Problem: Clinical Measurements: Goal: Ability to maintain clinical measurements within normal limits will improve Outcome: Progressing Goal: Will remain free from infection Outcome: Progressing Goal: Diagnostic test results will improve Outcome: Progressing Goal: Respiratory complications will improve Outcome: Progressing Goal: Cardiovascular complication  will be avoided Outcome: Progressing   Problem: Activity: Goal: Risk for activity intolerance will decrease Outcome: Progressing   Problem: Nutrition: Goal: Adequate nutrition will be maintained Outcome: Progressing   Problem: Coping: Goal: Level of anxiety will decrease Outcome: Progressing   Problem: Elimination: Goal: Will not experience complications related to bowel motility Outcome: Progressing Goal: Will not experience complications related to urinary retention Outcome: Progressing   Problem: Pain Managment: Goal: General experience of comfort will improve and/or be controlled Outcome: Progressing   Problem: Safety: Goal: Ability to remain free from injury will improve Outcome: Progressing   Problem: Skin Integrity: Goal: Risk for impaired skin integrity will decrease Outcome: Progressing

## 2023-11-23 NOTE — Progress Notes (Signed)
 Pt didn't use BiPAP today, will leave order as PRN.

## 2023-11-23 NOTE — TOC Initial Note (Signed)
 Transition of Care [facility]) - Initial/Assessment Note    Patient Details  Name: Frank Flowers MRN: [id number] Date of Birth: 1960-02-15  Transition of Care [facility]) CM/SW Contact:    Jon ONEIDA Anon, RN Phone Number: 11/23/2023, 2:05 PM  Clinical Narrative:                 Pt is from home with [name]. NCM met with pt at bedside and introduced role in DC planning. Pt states that he lives at home with the mother of his child and plans to return there once medically ready. Pt states he has a nebulizer in the home but denies any other DME. Denies HH needs. Pt will have available transportation at DC. Will likely need Home O2 at DC. IP CM will continue to follow for DC needs.     Expected Discharge Plan: Home/Self Care Barriers to Discharge: Continued Medical Work up (Weaning O2)   Patient Goals and CMS Choice Patient states their goals for this hospitalization and ongoing recovery are:: To return home CMS Medicare.gov Compare Post Acute Care list provided to:: Other (Comment Required) (NA) Choice offered to / list presented to : NA [place] ownership interest in [facility].provided to:: Parent NA    Expected Discharge Plan and Services In-house Referral: NA Discharge Planning Services: CM Consult Post Acute Care Choice: Durable Medical Equipment Living arrangements for the past 2 months: Single Family Home                 DME Arranged: N/A DME Agency: NA       HH Arranged: NA HH Agency: NA        Prior Living Arrangements/Services Living arrangements for the past 2 months: Single Family Home Lives with:: Significant Other Patient language and need for interpreter reviewed:: Yes Do you feel safe going back to the place where you live?: Yes      Need for Family Participation in Patient Care: No (Comment) Care giver support system in place?: No (comment) Current home services: DME (Nebulizer) Criminal Activity/Legal Involvement Pertinent to Current  Situation/Hospitalization: No - Comment as needed  Activities of Daily Living      Permission Sought/Granted Permission sought to share information with : Family Supports Permission granted to share information with : Yes, Verbal Permission Granted  Share Information with NAME: Helen Piper (Significant other)  [phone]           Emotional Assessment Appearance:: Appears stated age Attitude/Demeanor/Rapport: Engaged Affect (typically observed): Appropriate Orientation: : Oriented to Self, Oriented to Place, Oriented to  Time, Oriented to Situation Alcohol  / Substance Use: Not Applicable Psych Involvement: No (comment)  Admission diagnosis:  Acute respiratory failure (HCC) [J96.00] COPD exacerbation (HCC) [J44.1] Acute on chronic congestive heart failure, unspecified heart failure type (HCC) [I50.9] Patient Active Problem List   Diagnosis Date Noted   COPD exacerbation (HCC) 11/22/2023   Acute on chronic systolic CHF (congestive heart failure) (HCC) 11/22/2023   Obesity, Class II, BMI 35-39.9 11/22/2023   Acute respiratory failure (HCC) 11/21/2023   Acute respiratory distress 10/01/2023   Essential hypertension 10/01/2023   Hyperlipemia 10/01/2023   Osteoarthritis of right hip 09/17/2020   Rectal bleeding 06/26/2020   Ischemic cardiomyopathy 08/21/2019   CHF (congestive heart failure) (HCC) 04/05/2019   SOB (shortness of breath) 03/28/2019   Acute CHF (congestive heart failure) (HCC) 03/28/2019   Tobacco use disorder 07/20/2018   Acute MI, inferior wall, initial episode of care (HCC) 01/31/2011   Morbid obesity (HCC)  CAD (coronary artery disease) 01/29/2011   PCP:  Medicine, [facility]:   [id number] - [place], [place] - [address] [place] [address] Phone: [phone] Fax: [phone]     Social Drivers of Health (SDOH) Social History: SDOH Screenings   Food  Insecurity: No Food Insecurity (11/22/2023)  Housing: High Risk (11/22/2023)  Transportation Needs: No Transportation Needs (11/22/2023)  Utilities: At Risk (11/22/2023)  Alcohol  Screen: Low Risk  (10/04/2023)  Depression (PHQ2-9): Medium Risk (04/26/2023)  Financial Resource Strain: Low Risk  (10/04/2023)  Physical Activity: Not on File (04/30/2021)   [facility]  Social Connections: Not on File (09/23/2022)   Received from [facility]  Stress: Not on File (04/30/2021)   Received from [facility]  Tobacco Use: High Risk (11/18/2023)   SDOH Interventions:     Readmission Risk Interventions    11/23/2023    1:37 PM 10/04/2023    9:51 AM  Readmission Risk Prevention Plan  Post Dischage Appt Complete Complete  Medication Screening Complete Complete  Transportation Screening Complete Complete

## 2023-11-23 NOTE — Discharge Instructions (Signed)
 UTILITIES [facility] [address] [place] [phone] Rental assistance/rental hotline: [phone] ext. 340.    Utility assistance/utility hotline: 239-373-1712 ext. [address] Department of IT consultant (heating/cooling and water assistance) [phone] (rental and utility assistance) [phone]  [employer] - call 211

## 2023-11-24 DIAGNOSIS — J9601 Acute respiratory failure with hypoxia: Secondary | ICD-10-CM | POA: Diagnosis not present

## 2023-11-24 LAB — BASIC METABOLIC PANEL WITH GFR
Anion gap: 9 (ref 5–15)
BUN: 28 mg/dL — ABNORMAL HIGH (ref 8–23)
CO2: 29 mmol/L (ref 22–32)
Calcium: 9 mg/dL (ref 8.9–10.3)
Chloride: 97 mmol/L — ABNORMAL LOW (ref 98–111)
Creatinine, Ser: 0.96 mg/dL (ref 0.61–1.24)
GFR, Estimated: >60 mL/min (ref >60)
Glucose, Bld: 126 mg/dL — ABNORMAL HIGH (ref 70–99)
Potassium: 4.2 mmol/L (ref 3.5–5.1)
Sodium: 136 mmol/L (ref 135–145)

## 2023-11-24 LAB — CBC
HCT: 51.2 % (ref 39.0–52.0)
Hemoglobin: 16.9 g/dL (ref 13.0–17.0)
MCH: 29.3 pg (ref 26.0–34.0)
MCHC: 33 g/dL (ref 30.0–36.0)
MCV: 88.7 fL (ref 80.0–100.0)
Platelets: 343 K/uL (ref 150–400)
RBC: 5.77 MIL/uL (ref 4.22–5.81)
RDW: 14.5 % (ref 11.5–15.5)
WBC: 19.1 K/uL — ABNORMAL HIGH (ref 4.0–10.5)
nRBC: 0 % (ref 0.0–0.2)

## 2023-11-24 MED ORDER — METHYLPREDNISOLONE SODIUM SUCC 40 MG IJ SOLR
40.0000 mg | Freq: Two times a day (BID) | INTRAMUSCULAR | Status: DC
  Administered 2023-11-24 – 2023-11-25 (×2): 40 mg via INTRAVENOUS
  Filled 2023-11-24 (×2): qty 1

## 2023-11-24 NOTE — Plan of Care (Signed)
  Problem: Activity: Goal: Capacity to carry out activities will improve Outcome: Progressing   Problem: Education: Goal: Knowledge of disease or condition will improve Outcome: Progressing Goal: Knowledge of the prescribed therapeutic regimen will improve Outcome: Progressing Goal: Individualized Educational Video(s) Outcome: Progressing
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
## 2023-11-24 NOTE — Progress Notes (Addendum)
 PROGRESS NOTE Frank Flowers  FMW:[id number] DOB: 1960/12/17 DOA: 11/21/2023 PCP: Medicine, [facility]  Brief Narrative/Hospital Course: Frank Flowers is a 63 y.o. 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 Initial labs significant for proBNP 1700, otherwise unremarkable CMP, CBC and negative flu, RSV and COVID test. CXR shows cardiomegaly and chronic coarse interstitial markings but no active disease. Pt received IV Solu-Medrol , multiple DuoNebs and IV Lasix  60 mg x 1. TRH was consulted for admission.   Subjective: The patient reports he still has some symptoms of wheezing and coughing.  Does not report any chest pain.  Denies any fever or chills.  Does not report any significant sputum production.  Assessment and plan:  Acute respiratory failure-due to COPD and CHF exacerbation Acute COPD exacerbation Tobacco use disorder Presented with respiratory distress, needing BiPAP to ease work of breathing, due to COPD and CHF exacerbation. Now doing well on nasal cannula, continue to address underlying COPD CHF, tobacco cessation advised CXR with no evidence of PNA; Neg covid, RSV and flu test ContIV Solu-Medrol  80MG  Q12HR,azithromycin, budesonide , Brovana and Yupelri nebulizer ,w IS, prn nebs, Wean oxygen as tolerated to keep spo2 >92%-May need home oxygen.  Today he is still requiring 2 L of oxygen.  Continue to try to wean down.  Will decrease  dosage of IV Solu-Medrol  today.   Acute on chronic systolic heart failure- EF 35-40% in sept, proBNP elevated to 1700  on Lasix  40 daily as tolerated GDMT: spironolactone , Entresto , Coreg  and Jardiance  Cont to monitor daily I/O,weight, electrolytes and net balance as below. Wt improving Keep on  salt/fluid restricted diet and monitor in tele. Net IO Since Admission: -1,350 mL [11/24/23 1453]  Filed Weights   11/22/23 0200 11/23/23 0500  Weight: 121.2 kg 108 kg    Recent Labs  Lab 11/21/23 2010 11/22/23 0308 11/23/23 0751 11/24/23 0300  PROBNP 1,702.0*  --   --   --   BUN 14 14 25* 28*  CREATININE 0.93 0.94 0.95 0.96  K 3.7 3.6 4.1 4.2  MG  --  2.1  --   --     Prolonged Qtc EKG shows QTc of 488 on admission.Avoid QTc prolonging meds and trend electrolytes   CAD HLD: Hx of DES to RCA, stable w/o chest pain.Continue aspirin  and rosuvastatin    HTN Fairly stable 90s to 100,continue GDMT as above   Class I Obesity w/ Body mass index is 30.57 kg/m.: Will benefit with PCP follow-up, weight loss,healthy lifestyle and outpatient sleep eval if not done.  DVT prophylaxis: Lovenox  SQ Code Status:   Code Status: Full Code Family Communication: plan of care discussed with patient/none at bedside. Patient status is: Remains hospitalized because of severity of illness Level of care: Telemetry > transferred to telemetry  Dispo: The patient is from: home            Anticipated disposition: TBD Objective: Vitals last 24 hrs: Vitals:   11/24/23  1100 11/24/23 1200 11/24/23 1300 11/24/23 1400  BP:      Pulse: 81  77 73  Resp: (!) 26  (!) 26 17  Temp:  (!) 97 F (36.1 C)    TempSrc:  Axillary    SpO2: 96%  95% 96%  Weight:      Height:        Physical Examination: General exam: AAOX3 HEENT:Oral mucosa moist, Ear/Nose WNL grossly Respiratory system: Bilateral respiratory wheezing and rhonchi, Cardiovascular system: S1 & S2 +, No JVD. Gastrointestinal system: Abdomen  soft,NT,ND, BS+ Nervous System: Alert, awake, moving all extremities,and following commands. Extremities: extremities warm, leg edema minimal Skin: Warm, no rashes MSK: Normal muscle bulk,tone, power   Medications reviewed:  Scheduled Meds:  arformoterol  15 mcg Nebulization BID   aspirin   81 mg Oral Daily   budesonide  (PULMICORT ) nebulizer solution  0.25 mg Nebulization BID   carvedilol   3.125 mg Oral BID   Chlorhexidine  Gluconate Cloth  6 each Topical Daily   empagliflozin   10 mg Oral QAC breakfast   enoxaparin  (LOVENOX ) injection  60 mg Subcutaneous Q24H   furosemide   40 mg Intravenous Daily   methylPREDNISolone  (SOLU-MEDROL ) injection  40 mg Intravenous BID   nicotine   14 mg Transdermal Daily   revefenacin  175 mcg Nebulization Daily   rosuvastatin   40 mg Oral Daily   sacubitril -valsartan   1 tablet Oral Daily   spironolactone   12.5 mg Oral Daily   Continuous Infusions: Diet: Diet Order             Diet Heart Room service appropriate? Yes; Fluid consistency: Thin  Diet effective now                   Data Reviewed: I have personally reviewed following labs and imaging studies ( see epic result tab) CBC: Recent Labs  Lab 11/21/23 2010 11/22/23 0308 11/24/23 0300  WBC 9.7 11.9* 19.1*  HGB 16.9 16.8 16.9  HCT 51.7 51.6 51.2  MCV 89.4 89.0 88.7  PLT 266 265 343   CMP: Recent Labs  Lab 11/21/23 2010 11/22/23 0308 11/23/23 0751 11/24/23 0300  NA 139 140 137 136  K 3.7 3.6 4.1 4.2  CL 100 100 100 97*  CO2 26 29 27 29   GLUCOSE 99 141* 150* 126*  BUN 14 14 25* 28*  CREATININE 0.93 0.94 0.95 0.96  CALCIUM  9.3 9.1 9.4 9.0  MG  --  2.1  --   --    GFR: Estimated Creatinine Clearance: 103 mL/min (by C-G formula based on SCr of 0.96 mg/dL). Recent Labs  Lab 11/21/23 2010  AST 33  ALT 13  ALKPHOS 75  BILITOT 0.7  PROT 8.4*  ALBUMIN 3.4*   No results for input(s): LIPASE, AMYLASE in the last 168 hours. No results for input(s): AMMONIA in the last 168  hours. Coagulation Profile: No results for input(s): INR, PROTIME in the last 168 hours. Unresulted Labs (From admission, onward)     Start     Ordered   11/24/23 0500  Basic metabolic panel with GFR  Daily,   R     Question:  Specimen collection method  Answer:  Lab=Lab collect   11/23/23 0718   11/24/23 0500  CBC  Daily,   R     Question:  Specimen collection method  Answer:  Lab=Lab collect   11/23/23 0718   11/21/23 2323  Expectorated Sputum Assessment w Gram Stain, Rflx to Resp Cult  (COPD / Pneumonia /  Cellulitis / Lower Extremity Wound (Diabetic Foot Infection))  Once,   R        11/21/23 2324   11/21/23 2312  MRSA Next Gen by PCR, Nasal  Once,   R        11/21/23 2311           Antimicrobials/Microbiology: Anti-infectives (From admission, onward)    Start     Dose/Rate Route Frequency Ordered Stop   11/22/23 1000  azithromycin (ZITHROMAX) tablet 500 mg        500 mg Oral Daily 11/22/23 0123 11/24/23 0954      No results found for: SDES, SPECREQUEST, CULT, REPTSTATUS  Procedures:    MARGARETMARY DELENA HALEY, MD [facility] 11/24/2023, 2:54 PM

## 2023-11-24 NOTE — Progress Notes (Signed)
   11/24/23 0027  BiPAP/CPAP/SIPAP  BiPAP/CPAP/SIPAP Pt Type Adult  BiPAP/CPAP/SIPAP V60  Mask Type Full face mask  Mask Size Large  Set Rate 18 breaths/min  Respiratory Rate 23 breaths/min  IPAP 12 cmH20  EPAP 5 cmH2O  FiO2 (%) 30 %  Minute Ventilation 20.8  Leak 0  Peak Inspiratory Pressure (PIP) 12  Tidal Volume (Vt) 901  Patient Home Machine No  Patient Home Mask No  Patient Home Tubing No  Auto Titrate No  Press High Alarm 30 cmH2O  Press Low Alarm 5 cmH2O  Device Plugged into RED Power Outlet Yes

## 2023-11-25 ENCOUNTER — Other Ambulatory Visit (HOSPITAL_COMMUNITY): Payer: Self-pay

## 2023-11-25 ENCOUNTER — Encounter (HOSPITAL_COMMUNITY): Payer: Self-pay | Admitting: Student

## 2023-11-25 ENCOUNTER — Telehealth (HOSPITAL_COMMUNITY): Payer: Self-pay | Admitting: Pharmacy Technician

## 2023-11-25 LAB — CBC
HCT: 53.4 % — ABNORMAL HIGH (ref 39.0–52.0)
Hemoglobin: 17.4 g/dL — ABNORMAL HIGH (ref 13.0–17.0)
MCH: 28.8 pg (ref 26.0–34.0)
MCHC: 32.6 g/dL (ref 30.0–36.0)
MCV: 88.3 fL (ref 80.0–100.0)
Platelets: 366 K/uL (ref 150–400)
RBC: 6.05 MIL/uL — ABNORMAL HIGH (ref 4.22–5.81)
RDW: 14.5 % (ref 11.5–15.5)
WBC: 16.6 K/uL — ABNORMAL HIGH (ref 4.0–10.5)
nRBC: 0 % (ref 0.0–0.2)

## 2023-11-25 LAB — BASIC METABOLIC PANEL WITH GFR
Anion gap: 8 (ref 5–15)
BUN: 33 mg/dL — ABNORMAL HIGH (ref 8–23)
CO2: 31 mmol/L (ref 22–32)
Calcium: 9.4 mg/dL (ref 8.9–10.3)
Chloride: 98 mmol/L (ref 98–111)
Creatinine, Ser: 0.99 mg/dL (ref 0.61–1.24)
GFR, Estimated: >60 mL/min (ref >60)
Glucose, Bld: 148 mg/dL — ABNORMAL HIGH (ref 70–99)
Potassium: 4.2 mmol/L (ref 3.5–5.1)
Sodium: 137 mmol/L (ref 135–145)

## 2023-11-25 MED ORDER — PREDNISONE 10 MG PO TABS: 10.0000 mg | ORAL_TABLET | Freq: Every day | ORAL | 0 refills | Status: DC

## 2023-11-25 MED ORDER — GABAPENTIN 300 MG PO CAPS
300.0000 mg | ORAL_CAPSULE | Freq: Three times a day (TID) | ORAL | Status: DC
  Administered 2023-11-25: 300 mg via ORAL
  Filled 2023-11-25: qty 1

## 2023-11-25 MED ORDER — PREDNISONE 10 MG PO TABS: 40.0000 mg | ORAL_TABLET | Freq: Every day | ORAL | 0 refills | Status: DC

## 2023-11-25 MED ORDER — PREDNISONE 10 MG PO TABS: 40.0000 mg | ORAL_TABLET | Freq: Every day | ORAL | 0 refills | Status: AC

## 2023-11-25 MED ORDER — PREDNISONE 10 MG PO TABS: 40.0000 mg | ORAL_TABLET | Freq: Every day | ORAL | Status: DC

## 2023-11-25 NOTE — Evaluation (Signed)
 Physical Therapy Brief Evaluation and Discharge Note Patient Details Name: Frank Flowers MRN: [id number] DOB: 06/23/1960 Today's Date: 11/25/2023   History of Present Illness  63y.o. M admitted following SOB x1 day. 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  Clinical Impression  Pt standing in doorway when PT arrived and asking When can I go home?. He was agreeable to therapy evaluation. Session completed on RA and pt reported he did not require oxygen overnight after initially needing supplemental O2 upon arrival to hospital. Ambulated around unit without assistive device with O2 sats remaining >95%. Pt will be discharging home to his mother's home with support from her as needed. He does not have any physical therapy needs at this time, and will not require follow-up following discharge.        PT Assessment Patient does not need any further PT services  Assistance Needed at Discharge  None    Equipment Recommendations None recommended by PT  Recommendations for Other Services       Precautions/Restrictions Precautions Precautions: None Restrictions Weight Bearing Restrictions Per Provider Order: No        Mobility  Bed Mobility       General bed mobility comments: standing in doorway of room upon PT arrival  Transfers Overall transfer level: Independent                      Ambulation/Gait Ambulation/Gait assistance: Independent Gait Distance (Feet): 300 Feet Assistive device: None Gait Pattern/deviations: Wide base of support Gait Speed: Pace WFL General Gait Details: O2 continually assessed while ambulating, remained >95% throughout distance completed  Home Activity Instructions    Stairs            Modified Rankin (Stroke Patients Only)        Balance Overall balance assessment: Independent                        Pertinent Vitals/Pain   Pain Assessment Pain  Assessment: No/denies pain     Home Living Family/patient expects to be discharged to:: Private residence Living Arrangements: Spouse/significant other;Parent Available Help at Discharge: Family Home Environment: Level entry   Home Equipment: None        Prior Function Level of Independence: Independent      UE/LE Assessment   UE ROM/Strength/Tone/Coordination: WFL    LE ROM/Strength/Tone/Coordination: [facility]      Communication   Communication Communication: No apparent difficulties     Cognition Overall Cognitive Status: Appears within functional limits for tasks assessed/performed       General Comments      Exercises     Assessment/Plan    PT Problem List         PT Visit Diagnosis Difficulty in walking, not elsewhere classified (R26.2)    No Skilled PT Patient is independent with all acitivity/mobility   Co-evaluation                AMPAC 6 Clicks Help needed turning from your back to your side while in a flat bed without using bedrails?: None Help needed moving from lying on your back to sitting on the side of a flat bed without using bedrails?: None Help needed moving to and from a bed to a chair (including a wheelchair)?: None Help needed standing up from a chair using your arms (e.g., wheelchair or bedside chair)?: None Help needed to walk  in hospital room?: None Help needed climbing 3-5 steps with a railing? : None 6 Click Score: 24      End of Session   Activity Tolerance: Patient tolerated treatment well Patient left: in chair   PT Visit Diagnosis: Difficulty in walking, not elsewhere classified (R26.2)     Time: 8595-8587 PT Time Calculation (min) (ACUTE ONLY): 8 min  Charges:          Isaiah DEL. Rorey Bisson, PT, DPT  [employer]  11/25/2023, 3:51 PM

## 2023-11-25 NOTE — Plan of Care (Signed)
  Problem: Education: Goal: Ability to demonstrate management of disease process will improve Outcome: Adequate for Discharge Goal: Ability to verbalize understanding of medication therapies will improve Outcome: Adequate for Discharge Goal: Individualized Educational Video(s) Outcome: Adequate for Discharge   Problem: Activity: Goal: Capacity to carry out activities will improve Outcome: Adequate for Discharge   Problem: Cardiac: Goal: Ability to achieve and maintain adequate cardiopulmonary perfusion will improve Outcome: Adequate for Discharge   Problem: Education: Goal: Knowledge of disease or condition will improve Outcome: Adequate for Discharge Goal: Knowledge of the prescribed therapeutic regimen will improve Outcome: Adequate for Discharge Goal: Individualized Educational Video(s) Outcome: Adequate for Discharge   Problem: Activity: Goal: Ability to tolerate increased activity will improve Outcome: Adequate for Discharge Goal: Will verbalize the importance of balancing activity with adequate rest periods Outcome: Adequate for Discharge   Problem: Respiratory: Goal: Ability to maintain a clear airway will improve Outcome: Adequate for Discharge Goal: Levels of oxygenation will improve Outcome: Adequate for Discharge Goal: Ability to maintain adequate ventilation will improve Outcome: Adequate for Discharge   Problem: Education: Goal: Knowledge of General Education information will improve Description: Including pain rating scale, medication(s)/side effects and non-pharmacologic comfort measures Outcome: Adequate for Discharge   Problem: Health Behavior/Discharge Planning: Goal: Ability to manage health-related needs will improve Outcome: Adequate for Discharge   Problem: Clinical Measurements: Goal: Ability to maintain clinical measurements within normal limits will improve Outcome: Adequate for Discharge Goal: Will remain free from infection Outcome:  Adequate for Discharge Goal: Diagnostic test results will improve Outcome: Adequate for Discharge Goal: Respiratory complications will improve Outcome: Adequate for Discharge Goal: Cardiovascular complication will be avoided Outcome: Adequate for Discharge   Problem: Activity: Goal: Risk for activity intolerance will decrease Outcome: Adequate for Discharge   Problem: Nutrition: Goal: Adequate nutrition will be maintained Outcome: Adequate for Discharge   Problem: Coping: Goal: Level of anxiety will decrease Outcome: Adequate for Discharge   Problem: Elimination: Goal: Will not experience complications related to bowel motility Outcome: Adequate for Discharge Goal: Will not experience complications related to urinary retention Outcome: Adequate for Discharge   Problem: Pain Managment: Goal: General experience of comfort will improve and/or be controlled Outcome: Adequate for Discharge   Problem: Safety: Goal: Ability to remain free from injury will improve Outcome: Adequate for Discharge   Problem: Skin Integrity: Goal: Risk for impaired skin integrity will decrease Outcome: Adequate for Discharge

## 2023-11-25 NOTE — Discharge Summary (Signed)
 Physician Discharge Summary  Patient ID: Frank Flowers MRN: [id number] DOB/AGE: June 28, 1960 63 y.o.  Admit date: 11/21/2023 Discharge date: 11/25/2023  Admission Diagnoses:  Discharge Diagnoses:  Principal Problem:   Acute respiratory failure (HCC) Active Problems:   Essential hypertension   Tobacco use disorder   COPD exacerbation (HCC)   Acute on chronic systolic CHF (congestive heart failure) (HCC)   Obesity, Class II, BMI 35-39.9   Discharged Condition: fair  Hospital Course:  Frank Flowers is a 63 y.o. 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 Initial labs significant for proBNP 1700, otherwise unremarkable CMP, CBC and negative flu, RSV and COVID test. CXR shows cardiomegaly and chronic coarse interstitial markings but no active disease. Pt received IV Solu-Medrol , multiple DuoNebs and IV Lasix  60 mg x 1. TRH was consulted for admission.   Acute respiratory failure-due to COPD and CHF exacerbation Acute COPD exacerbation Tobacco use disorder Presented with respiratory distress, needing BiPAP to ease work of breathing, due to COPD and CHF exacerbation. Now doing well on nasal cannula, continue to address underlying COPD CHF, tobacco cessation advised CXR with no evidence of PNA; Neg covid, RSV and flu test Managed with IV Solu-Medrol  azithromycin, budesonide , Brovana and Yupelri nebulizer ,w IS, prn nebs. At DC would continue with 40mg  of  prednisone  daily for 5 days He has been weaned off oxygen.   Acute on chronic systolic heart failure- EF 35-40% in sept, proBNP elevated to 1700  on Lasix  40 daily as tolerated GDMT: spironolactone , Entresto , Coreg  and Jardiance  Cont to monitor daily I/O,weight, electrolytes and net balance as below. Wt improving Keep on  salt/fluid restricted diet and monitor in tele. Net IO Since Admission: -1,350 mL [11/24/23 1453]   Prolonged Qtc EKG shows QTc of 488 on admission.Avoid QTc prolonging meds and trend electrolytes   CAD HLD: Hx of DES to RCA, stable w/o chest pain.Continue aspirin  and rosuvastatin    HTN Fairly stable 90s to 100,continue GDMT as above    Class I Obesity w/ Body mass index is 30.57 kg/m.: Will benefit with PCP follow-up, weight loss,healthy lifestyle and outpatient sleep eval if not done.   Significant Diagnostic Studies: labs:  CBC    Component Value Date/Time   WBC 16.6 (H) 11/25/2023 0357   RBC 6.05 (H) 11/25/2023 0357   HGB 17.4 (H) 11/25/2023 0357   HGB 18.2 (H) 08/20/2019 1231   HCT 53.4 (H) 11/25/2023 0357   HCT 55.9 (H) 08/20/2019 1231   PLT 366 11/25/2023 0357   PLT 222 08/20/2019 1231   MCV 88.3 11/25/2023 0357   MCV 90 08/20/2019 1231   MCH 28.8 11/25/2023 0357   MCHC 32.6 11/25/2023 0357   RDW 14.5 11/25/2023 0357   RDW 14.5 08/20/2019 1231   LYMPHSABS 1.7 10/01/2023 1536   MONOABS 0.3 10/01/2023 1536   EOSABS 0.1 10/01/2023 1536   BASOSABS 0.1 10/01/2023 1536    CMP     Component Value Date/Time  NA 137 11/25/2023 0357   NA 141 07/22/2023 0905   K 4.2 11/25/2023 0357   CL 98 11/25/2023 0357   CO2 31 11/25/2023 0357   GLUCOSE 148 (H) 11/25/2023 0357   BUN 33 (H) 11/25/2023 0357   BUN 19 07/22/2023 0905   CREATININE 0.99 11/25/2023 0357   CALCIUM  9.4 11/25/2023 0357   PROT 8.4 (H) 11/21/2023 2010   PROT 7.7 07/22/2023 0905   ALBUMIN 3.4 (L) 11/21/2023 2010   ALBUMIN 4.1 07/22/2023 0905   AST 33 11/21/2023 2010   ALT 13  11/21/2023 2010   ALKPHOS 75 11/21/2023 2010   BILITOT 0.7 11/21/2023 2010   BILITOT 0.4 07/22/2023 0905   EGFR 68 07/22/2023 0905   GFRNONAA >60 11/25/2023 0357    Treatments: steroids: solu-medrol   Discharge Exam: Blood pressure 107/78, pulse 78, temperature 97.9 F (36.6 C), temperature source Oral, resp. rate 18, height 6' 2 (1.88 m), weight 108.8 kg, SpO2 98%.    General exam: AAOX3 HEENT:Oral mucosa moist, Ear/Nose WNL grossly Respiratory system: Diminished breath sounds, On room air Cardiovascular system: S1 & S2 +, No JVD. Gastrointestinal system: Abdomen soft,NT,ND, BS+ Nervous System: Alert, awake, moving all extremities,and following commands. Extremities: extremities warm, leg edema minimal Skin: Warm, no rashes MSK: Normal muscle bulk,tone, power  Disposition: Discharge disposition: 01-Home or Self Care        Allergies as of 11/25/2023       Reactions   Penicillins Other (See Comments)   Childhood allergy        Medication List     TAKE these medications    Advair  HFA 230-21 MCG/ACT inhaler Generic drug: fluticasone -salmeterol Inhale 2 puffs into the lungs 2 (two) times daily.   aspirin  81 MG chewable tablet Chew 1 tablet (81 mg total) by mouth 2 (two) times daily. What changed: when to take this   carvedilol  3.125 MG tablet Commonly known as: COREG  Take 1 tablet (3.125 mg total) by mouth 2 (two) times daily.   Entresto  24-26 MG Generic drug: sacubitril -valsartan  Take 1 tablet by mouth daily.   furosemide  40 MG tablet Commonly known as: LASIX  Take 60 mg by mouth in the morning. What changed: Another medication with the same name was removed. Continue taking this medication, and follow the directions you see here.   gabapentin  300 MG capsule Commonly known as: NEURONTIN  Take 300 mg by mouth 3 (three) times daily.   ipratropium-albuterol  0.5-2.5 (3) MG/3ML Soln Commonly known as: DUONEB Take 3 mLs by nebulization every 6 (six)  hours as needed (shortness of breath, wheezing). What changed:  when to take this additional instructions   Jardiance  10 MG Tabs tablet Generic drug: empagliflozin  Take 1 tablet (10 mg total) by mouth daily before breakfast.   loratadine  10 MG tablet Commonly known as: CLARITIN  Take 10 mg by mouth daily as needed for allergies.   meloxicam  15 MG tablet Commonly known as: MOBIC  Take 15 mg by mouth daily.   NICODERM CQ  TD Place 1 patch onto the skin daily as needed (for smoking cessation).   nicotine  21 mg/24hr patch Commonly known as: NICODERM CQ  - dosed in mg/24 hours Place 1 patch (21 mg total) onto the skin daily.   nicotine  polacrilex 2 MG gum Commonly known as: NICORETTE  Take 1 each (2 mg total) by mouth as needed for smoking cessation.   predniSONE  10 MG tablet Commonly known as: DELTASONE  Take 4 tablets (40 mg total) by mouth daily. Take 40mg  daily for 5 days What changed:  how much to take how to take this when to take this additional instructions   rosuvastatin  40 MG tablet Commonly known as: CRESTOR  Take 1 tablet (40 mg total) by mouth daily.   sildenafil 100 MG tablet Commonly known as: VIAGRA Take 100 mg by mouth daily as needed for erectile dysfunction.   spironolactone  25 MG tablet Commonly known as: ALDACTONE  Take 0.5 tablets (12.5 mg total) by mouth daily.   tiotropium 18 MCG inhalation capsule Commonly known as: SPIRIVA Place 1 capsule (18 mcg total) into inhaler and inhale daily. 1 puff a day daily   Ventolin  HFA 108 (90 Base) MCG/ACT inhaler Generic drug: albuterol  Inhale 2 puffs into the lungs every 6 (six) hours as needed for wheezing or shortness of breath.        Follow-up Information     [facility]. Go in 4 day(s).   Specialty: Cardiology Why: Hospital follow up 11/29/2023 @ 3 :30 pm PLEASE bring a current medication list to appointment FREE valet parking, entrance C, off [employer] [address] and [facility] entrance Contact information: [address] [place]  [address] [phone]                Signed: Landon FORBES Baller 11/25/2023, 2:46 PM

## 2023-11-25 NOTE — TOC Transition Note (Signed)
 Transition of Care [place]) - Discharge Note  Patient Details  Name: Frank Flowers MRN: [id number] Date of Birth: 01/09/1961  Transition of Care [facility]) CM/SW Contact:  Duwaine GORMAN Aran, LCSW Phone Number: 11/25/2023, 2:53 PM  Clinical Narrative: Patient weaned off oxygen, so there are no care management needs at this time.  Final next level of care: Home/Self Care Barriers to Discharge: Barriers Resolved  Patient Goals and CMS Choice Patient states their goals for this hospitalization and ongoing recovery are:: To return home CMS Medicare.gov Compare Post Acute Care list provided to:: Other (Comment Required) (NA) Choice offered to / list presented to : NA [place] ownership interest in [facility].provided to:: Parent NA   Discharge Plan and Services Additional resources added to the After Visit Summary for   In-house Referral: NA Discharge Planning Services: CM Consult Post Acute Care Choice: Durable Medical Equipment          DME Arranged: N/A DME Agency: NA HH Arranged: NA HH Agency: NA  Social Drivers of Health (SDOH) Interventions SDOH Screenings   Food Insecurity: No Food Insecurity (11/22/2023)  Housing: Unknown (11/25/2023)  Recent Concern: Housing - High Risk (11/22/2023)  Transportation Needs: No Transportation Needs (11/25/2023)  Utilities: At Risk (11/22/2023)  Alcohol  Screen: Low Risk  (11/25/2023)  Depression (PHQ2-9): Medium Risk (04/26/2023)  Financial Resource Strain: Low Risk  (11/25/2023)  Physical Activity: Not on File (04/30/2021)   Received from [facility]  Social Connections: Not on File (09/23/2022)   Received from [facility]  Stress: Not on File (04/30/2021)   Received from [facility]  Tobacco Use: High Risk (11/18/2023)   Readmission Risk Interventions    11/23/2023    1:37 PM 10/04/2023    9:51 AM  Readmission Risk Prevention Plan  Post Dischage Appt Complete Complete  Medication Screening Complete Complete  Transportation Screening Complete  Complete

## 2023-11-25 NOTE — Telephone Encounter (Signed)
 Patient Product/process Development Scientist completed.    The patient is insured through [facility] MEDICAID.     Ran test claim for Entresto  24-26 mg and the current 30 day co-pay is $4.00.  Ran test claim for Jardiance  10 mg and the current 30 day co-pay is $4.00.  Ran test claim for [employer] and tProduct Not covered, has to try 2 preferred medications.  Looks to only have tried Spiriva    This test claim was processed through [employer]- copay amounts may vary at other pharmacies due to [employer], or as the patient moves through the different stages of their insurance plan.     Reyes Sharps, CPHT Pharmacy Technician Patient Advocate Specialist Lead [facility] Patient Advocate Team Direct Number: [phone]  Fax: [phone]

## 2023-11-28 ENCOUNTER — Telehealth (HOSPITAL_COMMUNITY): Payer: Self-pay | Admitting: *Deleted

## 2023-11-28 NOTE — Telephone Encounter (Signed)
 Called to confirm/remind patient of their appointment at the [facility] on 11/29/23:      Appointment:              [x] Confirmed             [] Left mess              [] No answer/No voice mail             [] Phone not in service   Patient reminded to bring all medications and/or complete list.   Confirmed patient has transportation. Gave directions, instructed to utilize valet parking.
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
## 2023-11-29 ENCOUNTER — Ambulatory Visit (HOSPITAL_COMMUNITY)
Admit: 2023-11-29 | Discharge: 2023-11-29 | Disposition: A | Discharge status: Home / Self Care | Attending: Cardiology | Admitting: Cardiology

## 2023-11-29 ENCOUNTER — Encounter (HOSPITAL_COMMUNITY): Payer: Self-pay

## 2023-11-29 VITALS — BP 98/80 | HR 82 | Ht 74.0 in | Wt 278.0 lb

## 2023-11-29 DIAGNOSIS — I2510 Atherosclerotic heart disease of native coronary artery without angina pectoris: Secondary | ICD-10-CM | POA: Insufficient documentation

## 2023-11-29 DIAGNOSIS — Z7982 Long term (current) use of aspirin: Secondary | ICD-10-CM | POA: Diagnosis not present

## 2023-11-29 DIAGNOSIS — I255 Ischemic cardiomyopathy: Secondary | ICD-10-CM | POA: Diagnosis not present

## 2023-11-29 DIAGNOSIS — I10 Essential (primary) hypertension: Secondary | ICD-10-CM

## 2023-11-29 DIAGNOSIS — I110 Hypertensive heart disease with heart failure: Secondary | ICD-10-CM | POA: Diagnosis not present

## 2023-11-29 DIAGNOSIS — J449 Chronic obstructive pulmonary disease, unspecified: Secondary | ICD-10-CM | POA: Insufficient documentation

## 2023-11-29 DIAGNOSIS — F17210 Nicotine dependence, cigarettes, uncomplicated: Secondary | ICD-10-CM | POA: Diagnosis not present

## 2023-11-29 DIAGNOSIS — Z720 Tobacco use: Secondary | ICD-10-CM

## 2023-11-29 DIAGNOSIS — I5022 Chronic systolic (congestive) heart failure: Secondary | ICD-10-CM | POA: Insufficient documentation

## 2023-11-29 DIAGNOSIS — R0602 Shortness of breath: Secondary | ICD-10-CM | POA: Diagnosis not present

## 2023-11-29 DIAGNOSIS — F1010 Alcohol abuse, uncomplicated: Secondary | ICD-10-CM | POA: Diagnosis not present

## 2023-11-29 DIAGNOSIS — Z79899 Other long term (current) drug therapy: Secondary | ICD-10-CM | POA: Insufficient documentation

## 2023-11-29 MED ORDER — TORSEMIDE 20 MG PO TABS
60.0000 mg | ORAL_TABLET | Freq: Every day | ORAL | 1 refills | Status: AC
Start: 2023-11-29 — End: ?

## 2023-11-29 MED ORDER — SPIRONOLACTONE 25 MG PO TABS
12.5000 mg | ORAL_TABLET | Freq: Every day | ORAL | 1 refills | Status: AC
Start: 2023-11-29 — End: ?

## 2023-11-29 MED ORDER — CARVEDILOL 3.125 MG PO TABS: 3.1250 mg | ORAL_TABLET | Freq: Two times a day (BID) | ORAL | 1 refills | Status: AC

## 2023-11-29 MED ORDER — SACUBITRIL-VALSARTAN 24-26 MG PO TABS: ORAL_TABLET | ORAL | 1 refills | Status: AC

## 2023-11-29 MED ORDER — EMPAGLIFLOZIN 10 MG PO TABS: 10.0000 mg | ORAL_TABLET | Freq: Every day | ORAL | 1 refills | Status: AC

## 2023-11-29 NOTE — Patient Instructions (Signed)
 Medication Changes:  STOP Furosemide   START Torsemide 60 mg (3 tabs) Daily  DECREASE Entresto  to 1/2 tab Twice daily   RESTART Carvedilol  3.125 mg Twice daily   RESTART Jardiance  10 mg Daily  RESTART Spironolactone  12.5 mg (1/2 tab) Daily  Lab Work:  Your provider would like for you to return in 1 week to have labs drawn. You do not need an appointment for the lab. The lab is located on the first floor at [address] [facility]. The lab is open from 8:00 am to 4:30 pm  You may also go to any of these [facility] locations:   [facility] - [address] ([facility]) - [address] [phone] [address]   [employer]  - [address]    [place] - [address] - [address] [employer]  - [address] Chief Technology Officer)   Special Instructions // Education:  Do the following things EVERYDAY: Weigh yourself in the morning before breakfast. Write it down and keep it in a log. Take your medicines as prescribed Eat low salt foods--Limit salt (sodium) to 2000 mg per day.  Stay as active as you can everyday Limit all fluids for the day to less than 2 liters   Follow-Up in: Thank you for allowing us  to provider your heart failure care after your recent hospitalization. Please follow-up with [facility] in 1 month, they will call you for an appointment   If you have any questions, issues, or concerns before your next appointment please call our office at [phone], opt. 2 and leave a message for the triage nurse.

## 2023-12-06 ENCOUNTER — Ambulatory Visit (HOSPITAL_COMMUNITY): Payer: Self-pay | Admitting: Cardiology

## 2023-12-06 ENCOUNTER — Ambulatory Visit (HOSPITAL_COMMUNITY)
Admission: RE | Admit: 2023-12-06 | Discharge: 2023-12-06 | Disposition: A | Discharge status: Home / Self Care | Source: Ambulatory Visit | Attending: Cardiology | Admitting: Cardiology

## 2023-12-06 DIAGNOSIS — I5022 Chronic systolic (congestive) heart failure: Secondary | ICD-10-CM | POA: Insufficient documentation

## 2023-12-06 LAB — BASIC METABOLIC PANEL WITH GFR
Anion gap: 12 (ref 5–15)
BUN: 25 mg/dL — ABNORMAL HIGH (ref 8–23)
CO2: 27 mmol/L (ref 22–32)
Calcium: 9.2 mg/dL (ref 8.9–10.3)
Chloride: 100 mmol/L (ref 98–111)
Creatinine, Ser: 1.17 mg/dL (ref 0.61–1.24)
GFR, Estimated: >60 mL/min (ref >60)
Glucose, Bld: 84 mg/dL (ref 70–99)
Potassium: 4.1 mmol/L (ref 3.5–5.1)
Sodium: 139 mmol/L (ref 135–145)

## 2023-12-09 ENCOUNTER — Encounter (HOSPITAL_COMMUNITY): Payer: Self-pay

## 2023-12-14 ENCOUNTER — Ambulatory Visit (HOSPITAL_COMMUNITY): Admission: RE | Admit: 2023-12-14 | Source: Ambulatory Visit
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
## 2024-02-16 ENCOUNTER — Ambulatory Visit: Admitting: Cardiology
